# Patient Record
Sex: Female | Born: 1970 | Race: Black or African American | Hispanic: No | Marital: Married | Smoking: Current every day smoker
Health system: Southern US, Community
[De-identification: ages and names within clinical notes are randomized; demographics above are authoritative.]

## PROBLEM LIST (undated history)

## (undated) DIAGNOSIS — K121 Other forms of stomatitis: Secondary | ICD-10-CM

## (undated) DIAGNOSIS — K219 Gastro-esophageal reflux disease without esophagitis: Secondary | ICD-10-CM

## (undated) DIAGNOSIS — I10 Essential (primary) hypertension: Secondary | ICD-10-CM

## (undated) DIAGNOSIS — F32A Depression, unspecified: Secondary | ICD-10-CM

## (undated) DIAGNOSIS — U071 COVID-19: Secondary | ICD-10-CM

## (undated) DIAGNOSIS — E119 Type 2 diabetes mellitus without complications: Secondary | ICD-10-CM

## (undated) DIAGNOSIS — N390 Urinary tract infection, site not specified: Secondary | ICD-10-CM

## (undated) DIAGNOSIS — F329 Major depressive disorder, single episode, unspecified: Secondary | ICD-10-CM

## (undated) DIAGNOSIS — B019 Varicella without complication: Secondary | ICD-10-CM

## (undated) DIAGNOSIS — T7840XA Allergy, unspecified, initial encounter: Secondary | ICD-10-CM

## (undated) DIAGNOSIS — Z6372 Alcoholism and drug addiction in family: Secondary | ICD-10-CM

## (undated) DIAGNOSIS — F509 Eating disorder, unspecified: Secondary | ICD-10-CM

## (undated) HISTORY — DX: Alcoholism and drug addiction in family: Z63.72

## (undated) HISTORY — DX: Urinary tract infection, site not specified: N39.0

## (undated) HISTORY — DX: Type 2 diabetes mellitus without complications: E11.9

## (undated) HISTORY — DX: COVID-19: U07.1

## (undated) HISTORY — PX: UTERINE FIBROID SURGERY: SHX826

## (undated) HISTORY — DX: Allergy, unspecified, initial encounter: T78.40XA

## (undated) HISTORY — DX: Eating disorder, unspecified: F50.9

## (undated) HISTORY — DX: Varicella without complication: B01.9

## (undated) HISTORY — PX: HERNIA REPAIR: SHX51

## (undated) HISTORY — PX: TUBAL LIGATION: SHX77

## (undated) HISTORY — DX: Gastro-esophageal reflux disease without esophagitis: K21.9

## (undated) HISTORY — DX: Other forms of stomatitis: K12.1

## (undated) HISTORY — PX: TONSILLECTOMY: SUR1361

---

## 2010-10-30 DIAGNOSIS — D259 Leiomyoma of uterus, unspecified: Secondary | ICD-10-CM | POA: Insufficient documentation

## 2012-09-14 DIAGNOSIS — R61 Generalized hyperhidrosis: Secondary | ICD-10-CM | POA: Insufficient documentation

## 2013-10-25 DIAGNOSIS — F17210 Nicotine dependence, cigarettes, uncomplicated: Secondary | ICD-10-CM | POA: Insufficient documentation

## 2013-10-25 DIAGNOSIS — Z720 Tobacco use: Secondary | ICD-10-CM | POA: Insufficient documentation

## 2013-10-25 DIAGNOSIS — I10 Essential (primary) hypertension: Secondary | ICD-10-CM | POA: Insufficient documentation

## 2014-05-11 DIAGNOSIS — E6609 Other obesity due to excess calories: Secondary | ICD-10-CM | POA: Insufficient documentation

## 2014-05-11 DIAGNOSIS — Z6841 Body Mass Index (BMI) 40.0 and over, adult: Secondary | ICD-10-CM | POA: Insufficient documentation

## 2015-01-17 DIAGNOSIS — F1911 Other psychoactive substance abuse, in remission: Secondary | ICD-10-CM | POA: Insufficient documentation

## 2018-04-13 ENCOUNTER — Emergency Department: Payer: 59

## 2018-04-13 ENCOUNTER — Other Ambulatory Visit: Payer: Self-pay

## 2018-04-13 ENCOUNTER — Emergency Department
Admission: EM | Admit: 2018-04-13 | Discharge: 2018-04-13 | Disposition: A | Discharge status: Home / Self Care | Payer: 59 | Attending: Emergency Medicine | Admitting: Emergency Medicine

## 2018-04-13 DIAGNOSIS — I10 Essential (primary) hypertension: Secondary | ICD-10-CM | POA: Diagnosis not present

## 2018-04-13 DIAGNOSIS — R079 Chest pain, unspecified: Secondary | ICD-10-CM | POA: Diagnosis present

## 2018-04-13 DIAGNOSIS — F17200 Nicotine dependence, unspecified, uncomplicated: Secondary | ICD-10-CM | POA: Insufficient documentation

## 2018-04-13 HISTORY — DX: Major depressive disorder, single episode, unspecified: F32.9

## 2018-04-13 HISTORY — DX: Depression, unspecified: F32.A

## 2018-04-13 HISTORY — DX: Essential (primary) hypertension: I10

## 2018-04-13 LAB — BASIC METABOLIC PANEL
ANION GAP: 11 (ref 5–15)
BUN: 13 mg/dL (ref 6–20)
CALCIUM: 9.3 mg/dL (ref 8.9–10.3)
CHLORIDE: 102 mmol/L (ref 98–111)
CO2: 28 mmol/L (ref 22–32)
Creatinine, Ser: 0.83 mg/dL (ref 0.44–1.00)
GFR calc Af Amer: >60 mL/min (ref >60)
GFR calc non Af Amer: >60 mL/min (ref >60)
GLUCOSE: 127 mg/dL — AB (ref 70–99)
Potassium: 3.3 mmol/L — ABNORMAL LOW (ref 3.5–5.1)
Sodium: 141 mmol/L (ref 135–145)

## 2018-04-13 LAB — TROPONIN I
Troponin I: <0.03 ng/mL (ref <0.03)
Troponin I: <0.03 ng/mL (ref <0.03)

## 2018-04-13 LAB — CBC
HEMATOCRIT: 44 % (ref 36.0–46.0)
Hemoglobin: 14.1 g/dL (ref 12.0–15.0)
MCH: 27.4 pg (ref 26.0–34.0)
MCHC: 32 g/dL (ref 30.0–36.0)
MCV: 85.4 fL (ref 80.0–100.0)
NRBC: 0 % (ref 0.0–0.2)
PLATELETS: 256 10*3/uL (ref 150–400)
RBC: 5.15 MIL/uL — AB (ref 3.87–5.11)
RDW: 15.8 % — ABNORMAL HIGH (ref 11.5–15.5)
WBC: 13.5 10*3/uL — ABNORMAL HIGH (ref 4.0–10.5)

## 2018-04-13 NOTE — ED Notes (Signed)
Pt given urine cup for sample, unable to void at this time. Will continue to monitor

## 2018-04-13 NOTE — ED Triage Notes (Signed)
Central CP x 2 days. A&O, ambulatory. No distress noted. States she thinks it's gas. States pain is intermittent.

## 2018-04-13 NOTE — ED Provider Notes (Signed)
[facility] Emergency Department Provider Note  ____________________________________________  Time seen: Approximately 6:12 PM  I have reviewed the triage vital signs and the nursing notes.   HISTORY  Chief Complaint Chest Pain   HPI Judy Garcia is a 47 y.o. female with a history of smoking, hypertension and depression who presents for evaluation of chest pain.  Patient reports several daily episodes of left-sided chest pain that she describes as a dull pain that is mild to moderate, comes on both at rest or with exertion, lasts a minute and resolves on its own.  She feels the pain is from gas.  She denies any personal or family history of heart attacks, blood clots, recent travel immobilization, leg pain or swelling, hemoptysis, history of cancer, or exogenous hormones.  Patient denies any pain at this time.  No shortness of breath, dizziness, nausea or vomiting associated with these episodes.  Patient denies any drug use and reports that she has been clean for 27 years.  She is postmenopausal.  Past Medical History:  Diagnosis Date  . Depression   . Hypertension     There are no active problems to display for this patient.   Past Surgical History:  Procedure Laterality Date  . HERNIA REPAIR    . TUBAL LIGATION    . UTERINE FIBROID SURGERY      Prior to Admission medications   Not on File    Allergies Patient has no known allergies.  FH No history of coronary artery disease, PE, or DVT  Social History Social History   Tobacco Use  . Smoking status: Current Every Day Smoker  Substance Use Topics  . Alcohol use: Never    Frequency: Never  . Drug use: Not on file    Review of Systems  Constitutional: Negative for fever. Eyes: Negative for visual changes. ENT: Negative for sore throat. Neck: No neck pain  Cardiovascular: + chest pain. Respiratory: Negative for shortness of breath. Gastrointestinal: Negative for abdominal pain,  vomiting or diarrhea. Genitourinary: Negative for dysuria. Musculoskeletal: Negative for back pain. Skin: Negative for rash. Neurological: Negative for headaches, weakness or numbness. Psych: No SI or HI  ____________________________________________   PHYSICAL EXAM:  VITAL SIGNS: ED Triage Vitals  Enc Vitals Group     BP 04/13/18 1649 (!) 161/66     Pulse Rate 04/13/18 1649 84     Resp 04/13/18 1649 16     Temp 04/13/18 1649 98.1 F (36.7 C)     Temp src --      SpO2 04/13/18 1649 94 %     Weight 04/13/18 1650 225 lb (102.1 kg)     Height 04/13/18 1650 5\' 1"  (1.549 m)     Head Circumference --      Peak Flow --      Pain Score 04/13/18 1649 3     Pain Loc --      Pain Edu? --      Excl. in [place]? --     Constitutional: Alert and oriented. Well appearing and in no apparent distress. HEENT:      Head: Normocephalic and atraumatic.         Eyes: Conjunctivae are normal. Sclera is non-icteric.       Mouth/Throat: Mucous membranes are moist.       Neck: Supple with no signs of meningismus. Cardiovascular: Regular rate and rhythm. No murmurs, gallops, or rubs. 2+ symmetrical distal pulses are present in all extremities. No JVD.  Palpation  of the left chest wall reproduces the pain Respiratory: Normal respiratory effort. Lungs are clear to auscultation bilaterally. No wheezes, crackles, or rhonchi.  Gastrointestinal: Soft, non tender, and non distended with positive bowel sounds. No rebound or guarding. Musculoskeletal: Nontender with normal range of motion in all extremities. No edema, cyanosis, or erythema of extremities. Neurologic: Normal speech and language. Face is symmetric. Moving all extremities. No gross focal neurologic deficits are appreciated. Skin: Skin is warm, dry and intact. No rash noted. Psychiatric: Mood and affect are normal. Speech and behavior are normal.  ____________________________________________   LABS (all labs ordered are listed, but only abnormal  results are displayed)  Labs Reviewed  BASIC METABOLIC PANEL - Abnormal; Notable for the following components:      Result Value   Potassium 3.3 (*)    Glucose, Bld 127 (*)    All other components within normal limits  CBC - Abnormal; Notable for the following components:   WBC 13.5 (*)    RBC 5.15 (*)    RDW 15.8 (*)    All other components within normal limits  TROPONIN I  TROPONIN I   ____________________________________________  EKG  ED ECG REPORT I, Rudene Re, the attending physician, personally viewed and interpreted this ECG.  Normal sinus rhythm, rate of 86, LVH, normal intervals, normal axis, no ST elevations or depressions, T wave inversions in 3 and aVF.  No prior for comparison. ____________________________________________  RADIOLOGY  I have personally reviewed the images performed during this visit and I agree with the Radiologist's read.   Interpretation by Radiologist:  Dg Chest 2 View  Result Date: 04/13/2018 CLINICAL DATA:  Mid chest pain for 2 days EXAM: CHEST - 2 VIEW COMPARISON:  None. FINDINGS: No active infiltrate or effusion is seen. Mediastinal and hilar contours are unremarkable. The heart is within normal limits in size. No bony abnormality is noted IMPRESSION: No active cardiopulmonary disease. Electronically Signed   By: Ivar Drape M.D.   On: 04/13/2018 17:20     ____________________________________________   PROCEDURES  Procedure(s) performed: None Procedures Critical Care performed:  None ____________________________________________   INITIAL IMPRESSION / ASSESSMENT AND PLAN / ED COURSE  47 y.o. female with a history of smoking, hypertension and depression who presents for evaluation of several short lived episodes of L sided chest pain x 2 days with no associated symptoms. Pain reproducible on palpation of the chest wall, no rash. EKG showing TWI in inferior leads with no prior for comparison. First troponin negative. Will  monitor on telemetry and repeat troponin in 3 hours. CXR with no evidence of PNA or PTX. PERC negative for PE. No clinical suspicion for dissection no significant elevated blood pressure, short-lived episodes of pain with no current pain, neuro intact, normal mediastinum silhouette on chest x-ray and intact pulses in all 4 extremities.  Most likely musculoskeletal versus pleurisy versus gas pain.    _________________________ 8:39 PM on 04/13/2018 -----------------------------------------  2nd troponin is negative. Patient remains without CP. Will dc home with f/u with PCP. Discussed standard return precautions.    As part of my medical decision making, I reviewed the following data within the [place] notes reviewed and incorporated, Labs reviewed , EKG interpreted , Old chart reviewed, Radiograph reviewed , Notes from prior ED visits and [place] Controlled Substance Database    Pertinent labs & imaging results that were available during my care of the patient were reviewed by me and considered in my medical decision making (  see chart for details).    ____________________________________________   FINAL CLINICAL IMPRESSION(S) / ED DIAGNOSES  Final diagnoses:  Chest pain, unspecified type      NEW MEDICATIONS STARTED DURING THIS VISIT:  ED Discharge Orders    None       Note:  This document was prepared using Dragon voice recognition software and may include unintentional dictation errors.    Rudene Re, MD 04/13/18 2040

## 2018-04-13 NOTE — Discharge Instructions (Signed)

## 2018-07-28 ENCOUNTER — Encounter: Payer: Self-pay | Admitting: Family Medicine

## 2018-07-28 ENCOUNTER — Ambulatory Visit (INDEPENDENT_AMBULATORY_CARE_PROVIDER_SITE_OTHER): Payer: No Typology Code available for payment source | Admitting: Family Medicine

## 2018-07-28 VITALS — BP 112/80 | HR 86 | Temp 98.2°F | Resp 18 | Ht 60.0 in | Wt 229.6 lb

## 2018-07-28 DIAGNOSIS — L602 Onychogryphosis: Secondary | ICD-10-CM

## 2018-07-28 DIAGNOSIS — B351 Tinea unguium: Secondary | ICD-10-CM | POA: Insufficient documentation

## 2018-07-28 DIAGNOSIS — D170 Benign lipomatous neoplasm of skin and subcutaneous tissue of head, face and neck: Secondary | ICD-10-CM

## 2018-07-28 DIAGNOSIS — F1911 Other psychoactive substance abuse, in remission: Secondary | ICD-10-CM

## 2018-07-28 DIAGNOSIS — I10 Essential (primary) hypertension: Secondary | ICD-10-CM | POA: Diagnosis not present

## 2018-07-28 DIAGNOSIS — F17210 Nicotine dependence, cigarettes, uncomplicated: Secondary | ICD-10-CM

## 2018-07-28 DIAGNOSIS — F1011 Alcohol abuse, in remission: Secondary | ICD-10-CM

## 2018-07-28 DIAGNOSIS — F32A Depression, unspecified: Secondary | ICD-10-CM

## 2018-07-28 DIAGNOSIS — E559 Vitamin D deficiency, unspecified: Secondary | ICD-10-CM

## 2018-07-28 DIAGNOSIS — F17200 Nicotine dependence, unspecified, uncomplicated: Secondary | ICD-10-CM | POA: Insufficient documentation

## 2018-07-28 DIAGNOSIS — N951 Menopausal and female climacteric states: Secondary | ICD-10-CM

## 2018-07-28 DIAGNOSIS — F329 Major depressive disorder, single episode, unspecified: Secondary | ICD-10-CM

## 2018-07-28 MED ORDER — ALBUTEROL SULFATE 108 (90 BASE) MCG/ACT IN AEPB: 2.0000 | INHALATION_SPRAY | Freq: Four times a day (QID) | RESPIRATORY_TRACT | 2 refills | Status: DC | PRN

## 2018-07-28 NOTE — Progress Notes (Signed)
Subjective:    Patient ID: Judy Garcia, female    DOB: 03/02/1971, 48 y.o.   MRN: [id number]  HPI   Patient presents to clinic to establish with primary care.  Patient has a history of essential hypertension, depression, constipation.  Blood pressure is currently controlled with triamterene hydrochlorothiazide, feels well on this medication.  Has been taking Effexor for at least 4 to 5 years, feels mood is stable on this medication.  Denies any SI or HI.  Patient reports a past history of alcohol and drug abuse.  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  Patient is a cigarette smoker.  Smokes 1 pack/day.  Patient also complains of episodes of hot flashes and sweats throughout the day and throughout the night, she is menopausal.  Has not had a menstrual cycle in almost 2 years.  Patient also complains of thickened toenails and possibly toenail fungus.  She would like a referral to dermatology to have evaluation for possible removal of lump on her forehead.  Past Medical History:  Diagnosis Date  . Alcoholism and drug addiction in family   . Allergy   . Chicken pox   . Depression   . Eating disorder   . GERD (gastroesophageal reflux disease)   . Hypertension   . Ulcer (traumatic) of oral mucosa   . UTI (urinary tract infection)    Social History   Tobacco Use  . Smoking status: Current Every Day Smoker  . Smokeless tobacco: Never Used  Substance Use Topics  . Alcohol use: Never    Frequency: Never   Past Surgical History:  Procedure Laterality Date  . HERNIA REPAIR    . TONSILLECTOMY    . TUBAL LIGATION    . UTERINE FIBROID SURGERY     Family History  Problem Relation Age of Onset  . Diabetes Mother   . Cancer Maternal Grandmother    Review of Systems  Constitutional: Negative for chills, fatigue and fever. +hot flashes.  HENT: Negative for congestion, ear pain, sinus pain and sore throat.   Eyes: Negative.   Respiratory: Negative for cough,  shortness of breath and wheezing.   Cardiovascular: Negative for chest pain, palpitations and leg swelling.  Gastrointestinal: Negative for abdominal pain, diarrhea, nausea and vomiting.  Genitourinary: Negative for dysuria, frequency and urgency.  Musculoskeletal: Negative for arthralgias and myalgias.  Skin: +lump on face, back of head. +thick toenails, ?fungus.  Neurological: Negative for syncope, light-headedness and headaches.  Psychiatric/Behavioral: The patient is not nervous/anxious.       Objective:   Physical Exam Vitals signs and nursing note reviewed.  Constitutional:      General: She is not in acute distress.    Appearance: She is well-developed.  HENT:     Head: Normocephalic and atraumatic.      Comments: Soft lump on right forehead, approximately size of golf ball.  Does appear consistent with a lipoma.  Also has a harder lump on right posterior head, about size of a marble; suspect this could be a cyst.    Right Ear: Tympanic membrane, ear canal and external ear normal.     Left Ear: Tympanic membrane, ear canal and external ear normal.     Nose: Nose normal.     Mouth/Throat:     Mouth: Mucous membranes are moist.  Eyes:     General: No scleral icterus.       Right eye: No discharge.        Left eye:  No discharge.     Extraocular Movements: Extraocular movements intact.     Conjunctiva/sclera: Conjunctivae normal.     Pupils: Pupils are equal, round, and reactive to light.  Neck:     Musculoskeletal: Normal range of motion and neck supple.     Trachea: No tracheal deviation.  Cardiovascular:     Rate and Rhythm: Normal rate and regular rhythm.     Heart sounds: Normal heart sounds. No murmur. No friction rub. No gallop.   Pulmonary:     Effort: Pulmonary effort is normal. No respiratory distress.     Breath sounds: Normal breath sounds. No wheezing, rhonchi or rales.  Abdominal:     General: Bowel sounds are normal.     Palpations: Abdomen is soft.      Tenderness: There is no abdominal tenderness. There is no guarding.  Musculoskeletal: Normal range of motion.        General: No deformity.  Feet:     Comments: Toenails do appear thicker than normal, however I am unable to tell if they are fungal due to patient having purple nail polish on toenails Lymphadenopathy:     Cervical: No cervical adenopathy.  Skin:    General: Skin is warm and dry.     Capillary Refill: Capillary refill takes less than 2 seconds.     Coloration: Skin is not pale.     Findings: No erythema.  Neurological:     General: No focal deficit present.     Mental Status: She is alert and oriented to person, place, and time.     Cranial Nerves: No cranial nerve deficit.  Psychiatric:        Behavior: Behavior normal.        Thought Content: Thought content normal.    Vitals:   07/28/18 0919  BP: 112/80  Pulse: 86  Resp: 18  Temp: 98.2 F (36.8 C)  SpO2: 92%       Assessment & Plan:    Essential hypertension-patient's blood pressure is well controlled on current medication.  She will continue triamterene hydrochlorothiazide  Cigarette smoker-greater than 3 minutes spent discussing smoking cessation.  Discussed different options including nicotine patches, nicotine gum/lozenges or Chantix.  Patient is unsure which path she would like to take at this time but would like to think about it.  Advised patient of the risks of continued smoking including vascular issues, cancers, breathing problems and death.  Patient verbalizes understanding of all these risks.  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  Suspect the small cyst on back of head could be a lipoma or a cyst.  We will do referral to dermatology for further evaluation.  Thickened nails- toenails do appear thicker, I am unable to tell if they appear fungal.  Referral to podiatry placed.  Depressive disorder-patient's mood is stable on Effexor.  She will continue this dose.  Menopausal  symptoms- suspect patient's hot flashes are related to menopausal issues.  Advised patient that the Effexor she takes for her depression can also help with menopausal symptoms.  Patient is not a candidate for hormone replacement therapy due to her smoking.  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  She will get lab work in clinic today.  Patient will follow-up here in approximately 4 weeks for complete physical exam

## 2018-07-29 LAB — CBC
HEMATOCRIT: 43.6 % (ref 35.0–45.0)
Hemoglobin: 14.4 g/dL (ref 11.7–15.5)
MCH: 27.7 pg (ref 27.0–33.0)
MCHC: 33 g/dL (ref 32.0–36.0)
MCV: 83.8 fL (ref 80.0–100.0)
MPV: 12.3 fL (ref 7.5–12.5)
Platelets: 170 10*3/uL (ref 140–400)
RBC: 5.2 10*6/uL — ABNORMAL HIGH (ref 3.80–5.10)
RDW: 13.5 % (ref 11.0–15.0)
WBC: 14.7 10*3/uL — ABNORMAL HIGH (ref 3.8–10.8)

## 2018-07-29 LAB — HEMOGLOBIN A1C
HEMOGLOBIN A1C: 5.6 %{Hb} (ref <5.7)
MEAN PLASMA GLUCOSE: 114 (calc)
eAG (mmol/L): 6.3 (calc)

## 2018-07-29 LAB — THYROID PANEL WITH TSH

## 2018-07-30 MED ORDER — VITAMIN D (ERGOCALCIFEROL) 1.25 MG (50000 UNIT) PO CAPS: 50000.0000 [IU] | ORAL_CAPSULE | ORAL | 0 refills | Status: DC

## 2018-07-30 NOTE — Addendum Note (Signed)
Addended by: Philis Nettle on: 07/30/2018 04:45 PM   Modules accepted: Orders

## 2018-08-03 LAB — COMPREHENSIVE METABOLIC PANEL

## 2018-08-03 LAB — LIPID PANEL

## 2018-08-03 LAB — VITAMIN D 25 HYDROXY (VIT D DEFICIENCY, FRACTURES): VIT D 25 HYDROXY: 8 ng/mL — AB (ref 30–100)

## 2018-08-03 LAB — B12 AND FOLATE PANEL

## 2018-08-04 ENCOUNTER — Telehealth: Payer: Self-pay | Admitting: Family Medicine

## 2018-08-04 ENCOUNTER — Encounter: Payer: Self-pay | Admitting: Family Medicine

## 2018-08-04 NOTE — Telephone Encounter (Signed)
Looks like lab work was done at the hospital and his glucose was elevated at 127 and potassium was  3.3.   Please advise if anything needed to be address to the patient regarding results. Thanks

## 2018-08-04 NOTE — Telephone Encounter (Signed)
Lab results given and documented in result note 

## 2018-08-04 NOTE — Telephone Encounter (Signed)
Patient calling for results. NT unavailable.    Copied from [place] [phone]. Topic: Quick Communication - Lab Results (Clinic Use ONLY) >> Jul 30, 2018  4:48 PM Neta Ehlers, [place] wrote: Called patient to inform them of 07/29/2018 lab results. When patient returns call, triage nurse may disclose results.

## 2018-08-26 ENCOUNTER — Encounter: Payer: No Typology Code available for payment source | Admitting: Family Medicine

## 2018-09-07 ENCOUNTER — Telehealth: Payer: Self-pay | Admitting: Lab

## 2018-09-07 ENCOUNTER — Telehealth: Payer: Self-pay | Admitting: Family Medicine

## 2018-09-07 DIAGNOSIS — I10 Essential (primary) hypertension: Secondary | ICD-10-CM

## 2018-09-07 MED ORDER — TRIAMTERENE-HCTZ 37.5-25 MG PO CAPS: 2.0000 | ORAL_CAPSULE | Freq: Every day | ORAL | 0 refills | Status: DC

## 2018-09-07 NOTE — Telephone Encounter (Signed)
Patient left voicemail message requesting a new prescription of    triamterene-hydrochlorothiazide (DYAZIDE) 37.5-25 MG capsule to be sent to the pharmacy. Patient states that the pharmacy has been able to fill the current prescription because the current prescription states to take one capsule by mouth daily and the pt states she has been taking 2 capsules by mouth daily for a couple of years. Patient can be contacted at [phone]

## 2018-09-07 NOTE — Addendum Note (Signed)
Addended by: Philis Nettle on: 09/07/2018 01:57 PM   Modules accepted: Orders

## 2018-09-07 NOTE — Telephone Encounter (Signed)
Refill sent in for three month supply  Please schedule her for a physical some time in May 2020 or June 2020 so we can repeat lab work and get CPE done  She missed her physical appt that was scheduled on 08/26/2018

## 2018-09-07 NOTE — Telephone Encounter (Signed)
Pt called Judy Garcia Patient left voicemail message requesting a new prescription of    triamterene-hydrochlorothiazide (DYAZIDE) 37.5-25 MG capsule to be sent to the pharmacy. Patient states that the pharmacy has been able to fill the current prescription because the current prescription states to take one capsule by mouth daily and the pt states she has been taking 2 capsules by mouth daily for a couple of years. Patient can be contacted at [phone]   I can see the Rx, but it looks like there was no start date in the Pt chart next to the medication.

## 2018-09-07 NOTE — Telephone Encounter (Signed)
Perfect, thanks  LG

## 2018-09-07 NOTE — Telephone Encounter (Signed)
Called Pt and told her Rx was sent to the pharmacy, also scheduled her a Physical for 10/07/2018 @10 :00am

## 2018-09-13 ENCOUNTER — Telehealth: Payer: Self-pay

## 2018-09-13 NOTE — Telephone Encounter (Signed)
Called Pt No answer left a VM for Pt to call office back

## 2018-09-13 NOTE — Telephone Encounter (Signed)
There are multiple different colors, shapes of N95 masks. The N95 you get depends on the size you need. There is not something more protective than the N95 that I know of. If she has concerns with the mask she was given, I would suggest she speak with her manager and employee health to re-sized/make sure she is wearing right size

## 2018-09-13 NOTE — Telephone Encounter (Signed)
Pt called [employer]: Patient called to ask the nurse if she should be wearing a more protective  mask besides the N95 since she has bronchitis.  Patient stated that her employer issued the N95 to her but the nurses got a different mask.  Please advise and call patient back to let her know.  CB# [phone]

## 2018-09-13 NOTE — Telephone Encounter (Signed)
Copied from [place] [id number]. Topic: General - Other >> Sep 10, 2018 10:41 AM Keene Breath wrote: Reason for CRM: Patient called to ask the nurse if she should be wearing a more protective  mask besides the N95 since she has bronchitis.  Patient stated that her employer issued the N95 to her but the nurses got a different mask.  Please advise and call patient back to let her know.  CB# [phone]

## 2018-09-13 NOTE — Telephone Encounter (Signed)
Called Pt and spoke to her about the N95 mask. The Pt stated that the RN's and Dr,s walk around with a different kind of mask,  and with her Bronchitis should she have the same mask as the RN's &Dr's are wearing, and not the N95. Pt stated they will not give her a different mask without a doctors note. I told her that the N95 she has is the best protection  and if she have any concerns or feels that its not properly fitting to talk to her manager or employee health

## 2018-09-13 NOTE — Telephone Encounter (Signed)
I am not sure what type of mask she is referring to. As far as I know, the N95 is the best type of mask; next best is the surgical mask.

## 2018-10-01 ENCOUNTER — Other Ambulatory Visit: Payer: Self-pay

## 2018-10-01 ENCOUNTER — Ambulatory Visit (INDEPENDENT_AMBULATORY_CARE_PROVIDER_SITE_OTHER): Payer: No Typology Code available for payment source | Admitting: Family Medicine

## 2018-10-01 ENCOUNTER — Telehealth: Payer: Self-pay

## 2018-10-01 DIAGNOSIS — F329 Major depressive disorder, single episode, unspecified: Secondary | ICD-10-CM | POA: Diagnosis not present

## 2018-10-01 DIAGNOSIS — D170 Benign lipomatous neoplasm of skin and subcutaneous tissue of head, face and neck: Secondary | ICD-10-CM

## 2018-10-01 DIAGNOSIS — F32A Depression, unspecified: Secondary | ICD-10-CM

## 2018-10-01 DIAGNOSIS — Z716 Tobacco abuse counseling: Secondary | ICD-10-CM | POA: Diagnosis not present

## 2018-10-01 DIAGNOSIS — D171 Benign lipomatous neoplasm of skin and subcutaneous tissue of trunk: Secondary | ICD-10-CM | POA: Diagnosis not present

## 2018-10-01 MED ORDER — VARENICLINE TARTRATE 0.5 MG X 11 & 1 MG X 42 PO MISC: ORAL | 0 refills | Status: DC

## 2018-10-01 MED ORDER — VARENICLINE TARTRATE 1 MG PO TABS: 1.0000 mg | ORAL_TABLET | Freq: Two times a day (BID) | ORAL | 2 refills | Status: DC

## 2018-10-01 NOTE — Telephone Encounter (Signed)
Copied from [place] [id number]. Topic: Referral - Request for Referral >> Oct 01, 2018 10:32 AM Sheran Luz wrote: Has patient seen PCP for this complaint? No Referral for which specialty: Dermatology  Preferred provider/office: Unspecified- Judy Garcia is preferred  Reason for referral: Lipoma

## 2018-10-01 NOTE — Telephone Encounter (Signed)
Pt agreed to 3:40pm Doxy visit today

## 2018-10-01 NOTE — Progress Notes (Signed)
Patient ID: Judy Garcia, female   DOB: 09-18-1970, 48 y.o.   MRN: [id number]  Virtual Visit via video Note  This visit type was conducted due to national recommendations for restrictions regarding the COVID-19 pandemic (e.g. social distancing).  This format is felt to be most appropriate for this patient at this time.  All issues noted in this document were discussed and addressed.  No physical exam was performed (except for noted visual exam findings with Video Visits).   I connected with [name] on 28/76/81 at  3:40 PM EDT by a video enabled telemedicine application and verified that I am speaking with the correct person using two identifiers. Location patient: home Location provider: [facility] Persons participating in the virtual visit: patient, provider  I discussed the limitations, risks, security and privacy concerns of performing an evaluation and management service by video and the availability of in person appointments. I also discussed with the patient that there may be a patient responsible charge related to this service. The patient expressed understanding and agreed to proceed.  HPI:  Patient and I connected via video to discuss lipoma on her head and back and also discussed smoking cessation.  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  Seems sometimes a lipoma is putting some pressure on facial nerve causing weakness in that side of face at times, weakness in the size of his been having off and on for over 6 months.  States some days it does not bother her much other days she will notice it more.  We had put a referral for dermatology for patient back in in February, but patient did not go to appointment.  Patient also has a lipoma on left mid back that is painful especially when she leans back on it.  Patient is unable to show me the lipoma on her back at this time, but states she thinks it has gotten  bigger due to it being more tender when leaning back.   Has decided she wants to finally quit smoking. Feels she is ready to do this. Has smoked between 1/2 PPD to 1 PPD for over 20 years.  Her mood is stable on Effexor denies any SI or HI.  Denies shortness of breath or wheezing.  Denies fever or chills.  Denies loss of visual field.  Is able to smile and raise eyebrows.  Is able to chew food without issues.  Denies sharp pain in head.  Denies one-sided extremity weakness.  Denies GI or GU issues.  ROS: See pertinent positives and negatives per HPI.  Past Medical History:  Diagnosis Date  . Alcoholism and drug addiction in family   . Allergy   . Chicken pox   . Depression   . Eating disorder   . GERD (gastroesophageal reflux disease)   . Hypertension   . Ulcer (traumatic) of oral mucosa   . UTI (urinary tract infection)     Past Surgical History:  Procedure Laterality Date  . HERNIA REPAIR    . TONSILLECTOMY    . TUBAL LIGATION    . UTERINE FIBROID SURGERY      Family History  Problem Relation Age of Onset  . Diabetes Mother   . Cancer Maternal Grandmother    Social History   Tobacco Use  . Smoking status: Current Every Day Smoker  . Smokeless tobacco: Never Used  Substance Use Topics  . Alcohol  use: Never    Frequency: Never    Current Outpatient Medications:  .  Albuterol Sulfate (PROAIR RESPICLICK) 854 (90 Base) MCG/ACT AEPB, Inhale 2 puffs into the lungs 4 (four) times daily as needed., Disp: 1 each, Rfl: 2 .  Ascorbic Acid (VITAMIN C) 100 MG tablet, Take 100 mg by mouth as needed., Disp: , Rfl:  .  docusate (COLACE) 50 MG/5ML liquid, Take 50 mg by mouth as needed for mild constipation., Disp: , Rfl:  .  Multiple Vitamin (MULTIVITAMIN) tablet, Take 1 tablet by mouth daily., Disp: , Rfl:  .  triamterene-hydrochlorothiazide (DYAZIDE) 37.5-25 MG capsule, Take 2 each (2 capsules total) by mouth daily., Disp: 180 capsule, Rfl: 0 .  venlafaxine XR (EFFEXOR-XR) 75 MG  24 hr capsule, Take 75 mg by mouth daily with breakfast., Disp: , Rfl:  .  Vitamin D, Ergocalciferol, (DRISDOL) 1.25 MG (50000 UT) CAPS capsule, Take 1 capsule (50,000 Units total) by mouth every 7 (seven) days., Disp: 12 capsule, Rfl: 0 .  varenicline (CHANTIX CONTINUING MONTH PAK) 1 MG tablet, Take 1 tablet (1 mg total) by mouth 2 (two) times daily., Disp: 60 tablet, Rfl: 2 .  varenicline (CHANTIX STARTING MONTH PAK) 0.5 MG X 11 & 1 MG X 42 tablet, Take one 0.5 mg tablet by mouth 1x daily for 3 days, then increase to one 0.5 mg tablet 2x daily for 4 days, then increase to 1mg  2x daily., Disp: 53 tablet, Rfl: 0  EXAM:  GENERAL: alert, oriented, appears well and in no acute distress  HEENT: atraumatic, conjunttiva clear, no obvious abnormalities on inspection of external nose and ears  NECK: normal movements of the head and neck  LUNGS: on inspection no signs of respiratory distress, breathing rate appears normal, no obvious gross SOB, gasping or wheezing  CV: no obvious cyanosis  SKIN: Lipoma seen on right upper forehead alongside hairline.  Appears about the size of a large gumball.  MS: moves all visible extremities without noticeable abnormality  PSYCH/NEURO: pleasant and cooperative, no obvious depression or anxiety, speech and thought processing grossly intact.  Able to raise eyebrows puff out cheeks, smile, great teeth without issue.  ASSESSMENT AND PLAN:  Discussed the following assessment and plan:  Encounter for smoking cessation counseling - Plan: varenicline (CHANTIX STARTING MONTH PAK) 0.5 MG X 11 & 1 MG X 42 tablet, varenicline (CHANTIX CONTINUING MONTH PAK) 1 MG tablet  Lipoma of face - Plan: Ambulatory referral to Dermatology  Lipoma of back - Plan: Ambulatory referral to Dermatology  Depressive disorder  Patient will begin Chantix to help assist with smoking cessation.  She will begin with starting month pack and then go onto the continuing month pack.  Advised  patient to also get Nicorette or nicotine gum in addition to the Chantix.  The idea behind Chantix is to slowly wean self off of smoking and eventually you will not have urge to smoke at all.  Nicotine gum will also help curb cigarette cravings because when you have a craving for a cigarette you can use nicotine gum to help combat this.  It was discovered that patient did not go to dermatology appointment that was referred for her and set up back in February 2020.  We will refer back to dermatology for evaluation of a lipoma on her face and on her back.   I discussed the assessment and treatment plan with the patient. The patient was provided an opportunity to ask questions and all were answered. The patient agreed  with the plan and demonstrated an understanding of the instructions.   The patient was advised to call back or seek an in-person evaluation if the symptoms worsen or if the condition fails to improve as anticipated.  Patient will keep appointment as planned on Oct 07, 2018 for Pap smear.  She is aware she can call office at any time if issues arise.  Jodelle Green, FNP

## 2018-10-01 NOTE — Telephone Encounter (Signed)
Please have patient set up virtual visit -- not sure where lipoma is and would need to assess at least over virtual visit before I can refer  She missed her last appt here.

## 2018-10-04 ENCOUNTER — Telehealth: Payer: Self-pay | Admitting: Family Medicine

## 2018-10-04 NOTE — Telephone Encounter (Signed)
Judy Garcia,  This patient had a referral for lipoma on face back in Feb 2020, looks like it is closed but not sure if it was because the referral is incorrect?  She also has a lipoma on her back she would like removed.  If I need to put in a different referral I will!  Thanks!  LG

## 2018-10-04 NOTE — Telephone Encounter (Signed)
Ok thanks for looking into this!   If I re-referred, would they still see her?

## 2018-10-04 NOTE — Telephone Encounter (Signed)
yes

## 2018-10-04 NOTE — Telephone Encounter (Signed)
Perfect, I put in new referral  Thanks!

## 2018-10-04 NOTE — Telephone Encounter (Signed)
She was scheduled on 3/24 to be seen at [facility]. She no-showed the appointment. Melissa

## 2018-10-07 ENCOUNTER — Other Ambulatory Visit (HOSPITAL_COMMUNITY)
Admission: RE | Admit: 2018-10-07 | Discharge: 2018-10-07 | Disposition: A | Discharge status: Home / Self Care | Payer: No Typology Code available for payment source | Source: Ambulatory Visit | Attending: Family Medicine | Admitting: Family Medicine

## 2018-10-07 ENCOUNTER — Ambulatory Visit (INDEPENDENT_AMBULATORY_CARE_PROVIDER_SITE_OTHER): Payer: No Typology Code available for payment source | Admitting: Family Medicine

## 2018-10-07 ENCOUNTER — Other Ambulatory Visit: Payer: Self-pay

## 2018-10-07 ENCOUNTER — Encounter: Payer: Self-pay | Admitting: Family Medicine

## 2018-10-07 ENCOUNTER — Other Ambulatory Visit: Payer: Self-pay | Admitting: Family Medicine

## 2018-10-07 VITALS — BP 136/82 | HR 92 | Temp 98.7°F | Resp 18 | Ht 61.0 in | Wt 231.0 lb

## 2018-10-07 DIAGNOSIS — D171 Benign lipomatous neoplasm of skin and subcutaneous tissue of trunk: Secondary | ICD-10-CM

## 2018-10-07 DIAGNOSIS — F17210 Nicotine dependence, cigarettes, uncomplicated: Secondary | ICD-10-CM | POA: Diagnosis not present

## 2018-10-07 DIAGNOSIS — E559 Vitamin D deficiency, unspecified: Secondary | ICD-10-CM

## 2018-10-07 DIAGNOSIS — Z124 Encounter for screening for malignant neoplasm of cervix: Secondary | ICD-10-CM | POA: Insufficient documentation

## 2018-10-07 DIAGNOSIS — Z1231 Encounter for screening mammogram for malignant neoplasm of breast: Secondary | ICD-10-CM | POA: Diagnosis not present

## 2018-10-07 DIAGNOSIS — Z1211 Encounter for screening for malignant neoplasm of colon: Secondary | ICD-10-CM

## 2018-10-07 DIAGNOSIS — D170 Benign lipomatous neoplasm of skin and subcutaneous tissue of head, face and neck: Secondary | ICD-10-CM

## 2018-10-07 DIAGNOSIS — Z0000 Encounter for general adult medical examination without abnormal findings: Secondary | ICD-10-CM

## 2018-10-07 DIAGNOSIS — I10 Essential (primary) hypertension: Secondary | ICD-10-CM | POA: Diagnosis not present

## 2018-10-07 DIAGNOSIS — K5900 Constipation, unspecified: Secondary | ICD-10-CM

## 2018-10-07 DIAGNOSIS — F329 Major depressive disorder, single episode, unspecified: Secondary | ICD-10-CM

## 2018-10-07 DIAGNOSIS — E876 Hypokalemia: Secondary | ICD-10-CM

## 2018-10-07 DIAGNOSIS — F32A Depression, unspecified: Secondary | ICD-10-CM

## 2018-10-07 LAB — COMPREHENSIVE METABOLIC PANEL
ALT: 23 U/L (ref 0–35)
AST: 21 U/L (ref 0–37)
Albumin: 4.3 g/dL (ref 3.5–5.2)
Alkaline Phosphatase: 94 U/L (ref 39–117)
BUN: 18 mg/dL (ref 6–23)
CO2: 31 mEq/L (ref 19–32)
Calcium: 9.5 mg/dL (ref 8.4–10.5)
Chloride: 97 mEq/L (ref 96–112)
Creatinine, Ser: 0.68 mg/dL (ref 0.40–1.20)
GFR: 110.68 mL/min (ref >60.00)
Glucose, Bld: 110 mg/dL — ABNORMAL HIGH (ref 70–99)
Potassium: 3 mEq/L — ABNORMAL LOW (ref 3.5–5.1)
Sodium: 139 mEq/L (ref 135–145)
Total Bilirubin: 0.2 mg/dL (ref 0.2–1.2)
Total Protein: 7.3 g/dL (ref 6.0–8.3)

## 2018-10-07 LAB — LIPID PANEL
Cholesterol: 213 mg/dL — ABNORMAL HIGH (ref 0–200)
HDL: 59.4 mg/dL (ref >39.00)
LDL Cholesterol: 127 mg/dL — ABNORMAL HIGH (ref 0–99)
NonHDL: 153.6
Total CHOL/HDL Ratio: 4
Triglycerides: 135 mg/dL (ref 0.0–149.0)
VLDL: 27 mg/dL (ref 0.0–40.0)

## 2018-10-07 LAB — VITAMIN D 25 HYDROXY (VIT D DEFICIENCY, FRACTURES): VITD: 34.34 ng/mL (ref 30.00–100.00)

## 2018-10-07 LAB — TSH: TSH: 3.23 u[IU]/mL (ref 0.35–4.50)

## 2018-10-07 MED ORDER — DOCUSATE SODIUM 100 MG PO CAPS: 100.0000 mg | ORAL_CAPSULE | Freq: Every day | ORAL | 1 refills | Status: AC

## 2018-10-07 NOTE — Progress Notes (Signed)
Colace rx

## 2018-10-07 NOTE — Progress Notes (Signed)
Subjective:    Patient ID: Judy Garcia, female    DOB: 01-15-1971, 48 y.o.   MRN: [id number]  HPI   Patient presents to clinic for complete physical exam and Pap smear.  Overall she has been feeling well.  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  She has been called off some nights due to them not having a full sentences.  She is due for her mammogram we will get this ordered.  She has never had a colonoscopy, she is 49 years old so we will get a colonoscopy referral placed.  Patient has had abnormal Pap smear previously that required some freezing of cells of the cervix, but has not had anything abnormal since.  No issues with pelvic pain, abnormal bleeding or abnormal vaginal discharge.  Blood pressures have been stable on current medications.  Patient is a cigarette smoker, she is planning to start the Chantix.  Prescriptions were sent at last visit she has not yet picked them up.  Lipoma of her face continues to bother her, but is not painful.  Lipoma of back is painful especially when leaning up against a chair.  Mood is stable on Effexor.  Does not feel overly anxious or overly down.  Feels she overall is handling the additional stress of the coronavirus pandemic as best as possible.  Patient does usually see a dentist regularly, most recent appointment had to be pushed out due to COVID-19.  Has not seen an eye doctor recently, plans to make an eye doctor appointment once pandemic restrictions are lessened.  Patient Active Problem List   Diagnosis Date Noted  . Cigarette smoker 07/28/2018  . Lipoma of face 07/28/2018  . Thickened nails 07/28/2018  . Essential hypertension 07/28/2018  . History of alcohol abuse 07/28/2018  . Personal history of drug abuse ([place]) 07/28/2018  . Menopausal symptoms 07/28/2018  . Depressive disorder 07/28/2018   Social History   Tobacco Use  . Smoking status: Current Every Day Smoker  . Smokeless tobacco: Never Used   Substance Use Topics  . Alcohol use: Never    Frequency: Never   Past Surgical History:  Procedure Laterality Date  . HERNIA REPAIR    . TONSILLECTOMY    . TUBAL LIGATION    . UTERINE FIBROID SURGERY     Family History  Problem Relation Age of Onset  . Diabetes Mother   . Cancer Maternal Grandmother     Review of Systems  Constitutional: Negative for chills, fatigue and fever.  HENT: Negative for congestion, ear pain, sinus pain and sore throat.   Eyes: Negative.   Respiratory: Negative for cough, shortness of breath and wheezing.   Cardiovascular: Negative for chest pain, palpitations and leg swelling.  Gastrointestinal: Negative for abdominal pain, diarrhea, nausea and vomiting.  Genitourinary: Negative for dysuria, frequency and urgency.  Musculoskeletal: Negative for arthralgias and myalgias.  Skin: Lipoma of face and back Neurological: Negative for syncope, light-headedness and headaches.  Psychiatric/Behavioral: The patient is not nervous/anxious. No SI or HI.     Objective:   Physical Exam Vitals signs and nursing note reviewed.  Constitutional:      Appearance: She is well-developed. She is obese.  HENT:     Head: Normocephalic and atraumatic.     Right Ear: External ear normal.     Left Ear: External ear normal.     Nose: Nose normal.  Eyes:     General: No scleral icterus.  Right eye: No discharge.        Left eye: No discharge.     Conjunctiva/sclera: Conjunctivae normal.     Pupils: Pupils are equal, round, and reactive to light.  Neck:     Musculoskeletal: Normal range of motion and neck supple.     Trachea: No tracheal deviation.  Cardiovascular:     Rate and Rhythm: Normal rate and regular rhythm.     Heart sounds: Normal heart sounds. No murmur. No friction rub. No gallop.   Pulmonary:     Effort: Pulmonary effort is normal. No respiratory distress.     Breath sounds: Normal breath sounds. No wheezing, rhonchi or rales.  Abdominal:      General: Bowel sounds are normal.     Palpations: Abdomen is soft.     Tenderness: There is no abdominal tenderness. There is no guarding.     Hernia: There is no hernia in the right inguinal area or left inguinal area.  Genitourinary:    Pubic Area: No rash.      Labia:        Right: No rash, tenderness, lesion or injury.        Left: No rash, tenderness, lesion or injury.      Vagina: Normal.     Cervix: Normal.     Uterus: Not tender.      Adnexa:        Right: No tenderness.         Left: No tenderness.       Comments: Pap collected and sent to lab Musculoskeletal: Normal range of motion.        General: No deformity.  Lymphadenopathy:     Cervical: No cervical adenopathy.     Lower Body: No right inguinal adenopathy. No left inguinal adenopathy.  Skin:    General: Skin is warm and dry.     Capillary Refill: Capillary refill takes less than 2 seconds.     Coloration: Skin is not pale.     Findings: No erythema.          Comments: Blue marks on diagram represent lipoma locations  Neurological:     Mental Status: She is alert and oriented to person, place, and time.     Cranial Nerves: No cranial nerve deficit.  Psychiatric:        Behavior: Behavior normal.        Thought Content: Thought content normal.     Vitals:   10/07/18 1009  BP: 136/82  Pulse: 92  Resp: 18  Temp: 98.7 F (37.1 C)  SpO2: 95%       Assessment & Plan:    Routine medical exam-Pap smear collected and sent to lab.  Colon cancer screening referral placed to GI.  Mammogram ordered.  Discussed healthy diet and regular exercise, recommended diet high in lean proteins lots of vegetables, low sugars and lower carbs.  Discussed regular physical activity of at minimum walking for 30 minutes 3 to 5 days a week and light weight training.  Discussed safe sun practices including wearing SPF at least 30 when outdoors and wearing a hat to protect skin of face.  Encourage patient to make dentist and eye  doctor appointments with COVID-19 pandemic restrictions lessened.  Vitamin D deficiency-we will recheck vitamin D level today to see if it has improved from previous lab.  Hypertension-BP is stable on current medications.  Cigarette smoker-patient encouraged to start Chantix as discussed at previous visit.  Greater than  3 minutes spent discussing positive factors associated with smoking last and complete smoking cessation.  Patient states she will start this medication and does feel motivated to quit.  Depressive disorder-mood stable on current dose of Effexor.  Will continue.  Lipoma of face and back- dermatology referral has been placed at previous visit.  Patient will be contacted about these referrals.  Patient advised to let us know if she does not hear anything about the referral in the next 1 to 2 weeks.  We will have patient follow-up here in approximately 4 weeks for recheck on smoking cessation progress.  Aware she can call office at any time if issues arise.

## 2018-10-08 LAB — CYTOLOGY - PAP
Adequacy: ABSENT
Diagnosis: NEGATIVE

## 2018-10-08 MED ORDER — POTASSIUM CHLORIDE 20 MEQ PO PACK: 20.0000 meq | PACK | Freq: Every day | ORAL | 0 refills | Status: DC

## 2018-10-08 NOTE — Addendum Note (Signed)
Addended by: Philis Nettle on: 10/08/2018 01:53 PM   Modules accepted: Orders

## 2018-10-19 ENCOUNTER — Encounter: Payer: Self-pay | Admitting: Family Medicine

## 2018-11-01 ENCOUNTER — Encounter: Payer: Self-pay | Admitting: *Deleted

## 2018-12-01 ENCOUNTER — Telehealth: Payer: Self-pay | Admitting: Family Medicine

## 2018-12-01 ENCOUNTER — Encounter: Payer: Self-pay | Admitting: Family Medicine

## 2018-12-01 ENCOUNTER — Telehealth: Payer: Self-pay | Admitting: Lab

## 2018-12-01 NOTE — Telephone Encounter (Signed)
Please schedule doxy appt with patient to discuss her symptoms/situation  Thanks  LG

## 2018-12-01 NOTE — Telephone Encounter (Signed)
Called Pt cell No answer left a VM. Called Pt home number and Pt husband stated she was not there and he wanted to know if NP Philis Nettle could find out his Covid-19 results, because he did the Covid-19 test and does not have a provider. But he did say he will let his wife to call the office to set up an appt when she comes home.

## 2018-12-01 NOTE — Telephone Encounter (Signed)
Patient states she got tested for COVID testing, at [facility]. Her results are not back but patient is concerned about symptoms she is having. Patient would like pcp or nurse to call for further advice  Call back # [phone]

## 2018-12-01 NOTE — Telephone Encounter (Signed)
Called and spoke to patient.  Patient said that she got sick last week and was taken out of work and was tested for [employer] through [employer] [facility] [employer].   Patient stated that COVID test came back negative but she is still having symptoms.  Patient thinks that she may have had a sinus infection that may have turned into an upper respiratory infection.  Patient is using her inhaler, OTC meds and ibuprofen.  Patient said that she is not getting better. Symptoms are cough, runny nose, sore throat, body aches, low grade fever, wheeziness, and fatigue.  Patient said that she had shortness of breath yesterday but not today.  Patient said that she was advised by [facility] [employer] to follow up with her provider and currently cannot return to work.  Patient said that someone already called to schedule an appt w/ PCP. Pt scheduled for virtual visit with Philis Nettle on tomorrow at 10:00 am.

## 2018-12-01 NOTE — Telephone Encounter (Signed)
Doxy appt scheduled

## 2018-12-01 NOTE — Telephone Encounter (Signed)
Pt returned call and scheduled a F/U Doxy visit for 12/02/2018 at 10:00am with NP Philis Nettle.

## 2018-12-02 ENCOUNTER — Other Ambulatory Visit: Payer: Self-pay | Admitting: Lab

## 2018-12-02 ENCOUNTER — Telehealth: Payer: Self-pay | Admitting: Family Medicine

## 2018-12-02 ENCOUNTER — Other Ambulatory Visit: Payer: Self-pay

## 2018-12-02 ENCOUNTER — Ambulatory Visit (INDEPENDENT_AMBULATORY_CARE_PROVIDER_SITE_OTHER): Payer: No Typology Code available for payment source | Admitting: Family Medicine

## 2018-12-02 ENCOUNTER — Encounter: Payer: Self-pay | Admitting: Family Medicine

## 2018-12-02 DIAGNOSIS — R05 Cough: Secondary | ICD-10-CM

## 2018-12-02 DIAGNOSIS — R0989 Other specified symptoms and signs involving the circulatory and respiratory systems: Secondary | ICD-10-CM | POA: Diagnosis not present

## 2018-12-02 DIAGNOSIS — J988 Other specified respiratory disorders: Secondary | ICD-10-CM | POA: Diagnosis not present

## 2018-12-02 DIAGNOSIS — F17210 Nicotine dependence, cigarettes, uncomplicated: Secondary | ICD-10-CM | POA: Diagnosis not present

## 2018-12-02 DIAGNOSIS — R059 Cough, unspecified: Secondary | ICD-10-CM

## 2018-12-02 MED ORDER — VENLAFAXINE HCL ER 75 MG PO CP24: 75.0000 mg | ORAL_CAPSULE | Freq: Every day | ORAL | 0 refills | Status: DC

## 2018-12-02 MED ORDER — BENZONATATE 100 MG PO CAPS: 100.0000 mg | ORAL_CAPSULE | Freq: Three times a day (TID) | ORAL | 0 refills | Status: DC | PRN

## 2018-12-02 MED ORDER — GUAIFENESIN-CODEINE 100-10 MG/5ML PO SYRP: 10.0000 mL | ORAL_SOLUTION | Freq: Three times a day (TID) | ORAL | 0 refills | Status: DC | PRN

## 2018-12-02 MED ORDER — METHYLPREDNISOLONE 4 MG PO TBPK: ORAL_TABLET | ORAL | 0 refills | Status: DC

## 2018-12-02 MED ORDER — PROAIR RESPICLICK 108 (90 BASE) MCG/ACT IN AEPB: 2.0000 | INHALATION_SPRAY | Freq: Four times a day (QID) | RESPIRATORY_TRACT | 2 refills | Status: DC | PRN

## 2018-12-02 MED ORDER — DOXYCYCLINE HYCLATE 100 MG PO TABS: 100.0000 mg | ORAL_TABLET | Freq: Two times a day (BID) | ORAL | 0 refills | Status: DC

## 2018-12-02 NOTE — Telephone Encounter (Signed)
Medication Refill - Medication: venlafaxine XR (EFFEXOR-XR) 75 MG 24 hr capsule Pharmacy requesting call back to discuss medication dosage please advise. Pt is out   Has the patient contacted their pharmacy? Yes.   (Agent: If no, request that the patient contact the pharmacy for the refill.) (Agent: If yes, when and what did the pharmacy advise?)  Preferred Pharmacy (with phone number or street name):  [facility], [address] [address]  Phone: [phone] Fax: [phone]     Agent: Please be advised that RX refills may take up to 3 business days. We ask that you follow-up with your pharmacy.

## 2018-12-02 NOTE — Telephone Encounter (Signed)
Husband would have to set up new patient appt  I cannot go into a chart of a patient that is not my patient

## 2018-12-02 NOTE — Telephone Encounter (Signed)
Medication Refill - Medication: venlafaxine XR (EFFEXOR-XR) 75 MG 24 hr capsule [[id number]]    Has the patient contacted their pharmacy? No. (Agent: If no, request that the patient contact the pharmacy for the refill.) The patient forgot to mention that she was completley out of thi medication at her appointment this morning.  Preferred Pharmacy (with phone number or street name):  [facility], [place] [address] [phone] (Phone) [phone] (Fax)     Agent: Please be advised that RX refills may take up to 3 business days. We ask that you follow-up with your pharmacy.

## 2018-12-02 NOTE — Telephone Encounter (Signed)
Please schedule follow up in 5-6 days for patient, DOXY  Thanks  LG

## 2018-12-02 NOTE — Telephone Encounter (Signed)
Called Pt to remind her of Doxy visit today. I also told Pt NP Lauren Guse could not access his chart because he is not her Pt. He would have to call and set up a new Pt appt.

## 2018-12-02 NOTE — Telephone Encounter (Signed)
Appt scheduled for 12/10/18 @10 :00am

## 2018-12-02 NOTE — Progress Notes (Signed)
Patient ID: Judy Garcia, female   DOB: [birth year], 48 y.o.   MRN: [id number]    Virtual Visit via video Note  This visit type was conducted due to national recommendations for restrictions regarding the COVID-19 pandemic (e.g. social distancing).  This format is felt to be most appropriate for this patient at this time.  All issues noted in this document were discussed and addressed.  No physical exam was performed (except for noted visual exam findings with Video Visits).   I connected with Judy Garcia today at 10:00 AM EDT by a video enabled telemedicine application or telephone and verified that I am speaking with the correct person using two identifiers. Location patient: home Location provider: work or home office Persons participating in the virtual visit: patient, provider  I discussed the limitations, risks, security and privacy concerns of performing an evaluation and management service by video and the availability of in person appointments. I also discussed with the patient that there may be a patient responsible charge related to this service. The patient expressed understanding and agreed to proceed.  HPI: Patient and I connected via video to discuss symptoms of cough, chest congestion nasal congestion.  Patient has had these symptoms for approximately 1 week.  She was tested on 629 for COVID and her results were NEGATIVE. She is Furniture conservator/restorer.   Patient states throughout this past week she feels her symptoms have remained about the same.  Cough is the worst at night, feels congestion head when trying to lay down to get to sleep.  Has been using her albuterol inhaler 1-2 times a day to help open up her lungs.  Also has been taking Mucinex with some help in reducing congestion.  Denies any fevers or chills.  Denies any nausea, vomiting or diarrhea.   ROS: See pertinent positives and negatives per HPI.  Past Medical History:  Diagnosis Date  . Alcoholism and drug addiction in  family   . Allergy   . Chicken pox   . Depression   . Eating disorder   . GERD (gastroesophageal reflux disease)   . Hypertension   . Ulcer (traumatic) of oral mucosa   . UTI (urinary tract infection)     Past Surgical History:  Procedure Laterality Date  . HERNIA REPAIR    . TONSILLECTOMY    . TUBAL LIGATION    . UTERINE FIBROID SURGERY      Family History  Problem Relation Age of Onset  . Diabetes Mother   . Cancer Maternal Grandmother     Social History   Tobacco Use  . Smoking status: Current Every Day Smoker  . Smokeless tobacco: Never Used  Substance Use Topics  . Alcohol use: Never    Frequency: Never     Current Outpatient Medications:  .  Albuterol Sulfate (PROAIR RESPICLICK) 160 (90 Base) MCG/ACT AEPB, Inhale 2 puffs into the lungs 4 (four) times daily as needed., Disp: 1 each, Rfl: 2 .  Ascorbic Acid (VITAMIN C) 100 MG tablet, Take 100 mg by mouth as needed., Disp: , Rfl:  .  docusate sodium (COLACE) 100 MG capsule, Take 1 capsule (100 mg total) by mouth daily., Disp: 90 capsule, Rfl: 1 .  Multiple Vitamin (MULTIVITAMIN) tablet, Take 1 tablet by mouth daily., Disp: , Rfl:  .  triamterene-hydrochlorothiazide (DYAZIDE) 37.5-25 MG capsule, Take 2 each (2 capsules total) by mouth daily., Disp: 180 capsule, Rfl: 0 .  varenicline (CHANTIX CONTINUING MONTH PAK) 1 MG tablet, Take 1 tablet (1  mg total) by mouth 2 (two) times daily., Disp: 60 tablet, Rfl: 2 .  varenicline (CHANTIX STARTING MONTH PAK) 0.5 MG X 11 & 1 MG X 42 tablet, Take one 0.5 mg tablet by mouth 1x daily for 3 days, then increase to one 0.5 mg tablet 2x daily for 4 days, then increase to 1mg  2x daily., Disp: 53 tablet, Rfl: 0 .  venlafaxine XR (EFFEXOR-XR) 75 MG 24 hr capsule, Take 75 mg by mouth daily with breakfast., Disp: , Rfl:  .  Vitamin D, Ergocalciferol, (DRISDOL) 1.25 MG (50000 UT) CAPS capsule, Take 1 capsule (50,000 Units total) by mouth every 7 (seven) days., Disp: 12 capsule, Rfl: 0 .   potassium chloride (KLOR-CON) 20 MEQ packet, Take 20 mEq by mouth daily for 5 days., Disp: 5 packet, Rfl: 0  EXAM:  GENERAL: alert, oriented, appears well and in no acute distress  HEENT: atraumatic, conjunttiva clear, no obvious abnormalities on inspection of external nose and ears  NECK: normal movements of the head and neck  LUNGS: on inspection no signs of respiratory distress, breathing rate appears normal, no obvious gross SOB, gasping or wheezing. +moist, raspy cough present  CV: no obvious cyanosis  MS: moves all visible extremities without noticeable abnormality  PSYCH/NEURO: pleasant and cooperative, no obvious depression or anxiety, speech and thought processing grossly intact  ASSESSMENT AND PLAN:  Discussed the following assessment and plan:  Respiratory infection, cough, chest congestion, cigarette smoker- patient's COVID-19 testing was negative.  Suspect patient has underlying respiratory infection.  We will cover her with doxycycline twice daily for 10 days and oral steroid taper.  Advised to also use her albuterol inhaler at minimum 2 puffs twice a day for the next week as well as as needed.  Advised I will send in [place] to try as a cough suppressant and also I will send her Robitussin with codeine to take prior to going to sleep at night to help calm cough and allow her to get some rest.  Patient advised that we will take her out of work for at minimum another 7 days and follow-up next week to ensure she is improving.  Advised that if her symptoms do worsen over the weekend, such as shortness of breath gets worse, develops chest pain, has high fever, develops any severe diarrhea or vomiting to go to the ER right away for evaluation.  Patient verbalized understanding.  Patient also encouraged to work on smoking less and eventually quitting smoking.  Advised patient that being a smoker makes her more at risk to become very ill from respiratory type infections.   I  discussed the assessment and treatment plan with the patient. The patient was provided an opportunity to ask questions and all were answered. The patient agreed with the plan and demonstrated an understanding of the instructions.   The patient was advised to call back or seek an in-person evaluation if the symptoms worsen or if the condition fails to improve as anticipated.  We will have patient follow-up early next week to monitor for improvement.  Jodelle Green, FNP

## 2018-12-06 ENCOUNTER — Other Ambulatory Visit: Payer: Self-pay

## 2018-12-06 ENCOUNTER — Ambulatory Visit (INDEPENDENT_AMBULATORY_CARE_PROVIDER_SITE_OTHER): Payer: No Typology Code available for payment source | Admitting: Family Medicine

## 2018-12-06 ENCOUNTER — Other Ambulatory Visit: Payer: Self-pay | Admitting: Family Medicine

## 2018-12-06 ENCOUNTER — Telehealth: Payer: Self-pay | Admitting: Family Medicine

## 2018-12-06 DIAGNOSIS — R059 Cough, unspecified: Secondary | ICD-10-CM

## 2018-12-06 DIAGNOSIS — J988 Other specified respiratory disorders: Secondary | ICD-10-CM

## 2018-12-06 DIAGNOSIS — F329 Major depressive disorder, single episode, unspecified: Secondary | ICD-10-CM

## 2018-12-06 DIAGNOSIS — R05 Cough: Secondary | ICD-10-CM

## 2018-12-06 DIAGNOSIS — Z716 Tobacco abuse counseling: Secondary | ICD-10-CM | POA: Diagnosis not present

## 2018-12-06 DIAGNOSIS — R0989 Other specified symptoms and signs involving the circulatory and respiratory systems: Secondary | ICD-10-CM

## 2018-12-06 DIAGNOSIS — F32A Depression, unspecified: Secondary | ICD-10-CM

## 2018-12-06 MED ORDER — VARENICLINE TARTRATE 1 MG PO TABS: 1.0000 mg | ORAL_TABLET | Freq: Two times a day (BID) | ORAL | 2 refills | Status: DC

## 2018-12-06 MED ORDER — VENLAFAXINE HCL ER 75 MG PO CP24: 225.0000 mg | ORAL_CAPSULE | Freq: Every day | ORAL | 3 refills | Status: DC

## 2018-12-06 NOTE — Progress Notes (Signed)
effexor rx

## 2018-12-06 NOTE — Telephone Encounter (Signed)
Pharmacy called and stated that the instruction on this med is incorrect. Pt told pharmacy that she take 3  Day.  Please advise

## 2018-12-06 NOTE — Telephone Encounter (Signed)
Please schedule 2 month follow up appt  Thanks!  LG

## 2018-12-06 NOTE — Telephone Encounter (Signed)
Pt appt scheduled for 02/08/2019 @ 2:00pm

## 2018-12-06 NOTE — Progress Notes (Signed)
Patient ID: Judy Garcia, female   DOB: [birth year], 48 y.o.   MRN: [id number]    Virtual Visit via video Note  This visit type was conducted due to national recommendations for restrictions regarding the COVID-19 pandemic (e.g. social distancing).  This format is felt to be most appropriate for this patient at this time.  All issues noted in this document were discussed and addressed.  No physical exam was performed (except for noted visual exam findings with Video Visits).   I connected with [name] today at  2:20 PM EDT by a video enabled telemedicine application or telephone and verified that I am speaking with the correct person using two identifiers. Location patient: home Location provider: work or home office Persons participating in the virtual visit: patient, provider  I discussed the limitations, risks, security and privacy concerns of performing an evaluation and management service by video and the availability of in person appointments. I also discussed with the patient that there may be a patient responsible charge related to this service. The patient expressed understanding and agreed to proceed.  HPI:  Patient and I connected via video to follow-up on respiratory infection after treatment with antibiotics, steroid and albuterol inhaler use twice a day.  Patient is feeling much much better.  Chest congestion improved, cough less, does not feel short of breath or wheezy.  Patient had been tested for COVID-19 prior to our visit last week and that was negative.  Denies any fever or chills; aches and energy levels improved.  Patient states "I am feeling better each day, I am almost back to 100% better"  Patient states she did miss a couple of doses of Chantix due to not feeling well last week, and would like to get back on track with taking that and to work on quitting smoking.  Patient states prior to missing 2 days worth of medication, she was up to the 1 mg twice a day dosing.    ROS: See pertinent positives and negatives per HPI.  Past Medical History:  Diagnosis Date  . Alcoholism and drug addiction in family   . Allergy   . Chicken pox   . Depression   . Eating disorder   . GERD (gastroesophageal reflux disease)   . Hypertension   . Ulcer (traumatic) of oral mucosa   . UTI (urinary tract infection)     Past Surgical History:  Procedure Laterality Date  . HERNIA REPAIR    . TONSILLECTOMY    . TUBAL LIGATION    . UTERINE FIBROID SURGERY      Family History  Problem Relation Age of Onset  . Diabetes Mother   . Cancer Maternal Grandmother    Social History   Tobacco Use  . Smoking status: Current Every Day Smoker  . Smokeless tobacco: Never Used  Substance Use Topics  . Alcohol use: Never    Frequency: Never    Current Outpatient Medications:  .  Albuterol Sulfate (PROAIR RESPICLICK) 673 (90 Base) MCG/ACT AEPB, Inhale 2 puffs into the lungs 4 (four) times daily as needed., Disp: 1 each, Rfl: 2 .  Ascorbic Acid (VITAMIN C) 100 MG tablet, Take 100 mg by mouth as needed., Disp: , Rfl:  .  benzonatate (TESSALON) 100 MG capsule, Take 1 capsule (100 mg total) by mouth 3 (three) times daily as needed., Disp: 30 capsule, Rfl: 0 .  docusate sodium (COLACE) 100 MG capsule, Take 1 capsule (100 mg total) by mouth daily., Disp: 90 capsule, Rfl:  1 .  doxycycline (VIBRA-TABS) 100 MG tablet, Take 1 tablet (100 mg total) by mouth 2 (two) times daily., Disp: 20 tablet, Rfl: 0 .  guaiFENesin-codeine (ROBITUSSIN AC) 100-10 MG/5ML syrup, Take 10 mLs by mouth 3 (three) times daily as needed for cough., Disp: 120 mL, Rfl: 0 .  methylPREDNISolone (MEDROL DOSEPAK) 4 MG TBPK tablet, Take according to pack instructions, Disp: 21 tablet, Rfl: 0 .  Multiple Vitamin (MULTIVITAMIN) tablet, Take 1 tablet by mouth daily., Disp: , Rfl:  .  triamterene-hydrochlorothiazide (DYAZIDE) 37.5-25 MG capsule, Take 2 each (2 capsules total) by mouth daily., Disp: 180 capsule, Rfl: 0  .  varenicline (CHANTIX CONTINUING MONTH PAK) 1 MG tablet, Take 1 tablet (1 mg total) by mouth 2 (two) times daily., Disp: 60 tablet, Rfl: 2 .  varenicline (CHANTIX STARTING MONTH PAK) 0.5 MG X 11 & 1 MG X 42 tablet, Take one 0.5 mg tablet by mouth 1x daily for 3 days, then increase to one 0.5 mg tablet 2x daily for 4 days, then increase to 1mg  2x daily., Disp: 53 tablet, Rfl: 0 .  venlafaxine XR (EFFEXOR-XR) 75 MG 24 hr capsule, Take 3 capsules (225 mg total) by mouth daily with breakfast., Disp: 270 capsule, Rfl: 3 .  Vitamin D, Ergocalciferol, (DRISDOL) 1.25 MG (50000 UT) CAPS capsule, Take 1 capsule (50,000 Units total) by mouth every 7 (seven) days., Disp: 12 capsule, Rfl: 0 .  potassium chloride (KLOR-CON) 20 MEQ packet, Take 20 mEq by mouth daily for 5 days., Disp: 5 packet, Rfl: 0  EXAM:  GENERAL: alert, oriented, appears well and in no acute distress  HEENT: atraumatic, conjunttiva clear, no obvious abnormalities on inspection of external nose and ears  NECK: normal movements of the head and neck  LUNGS: on inspection no signs of respiratory distress, breathing rate appears normal, no obvious gross SOB, gasping or wheezing. Voice sounds much better, no congested cough heard when we are speaking over video feed.   CV: no obvious cyanosis  MS: moves all visible extremities without noticeable abnormality  PSYCH/NEURO: pleasant and cooperative, no obvious depression or anxiety, speech and thought processing grossly intact  ASSESSMENT AND PLAN:  Discussed the following assessment and plan:  Respiratory infection, cough, chest congestion-patient is improving and sounds much better today than compared to last week.  Advised to finish antibiotics and steroids as prescribed and use albuterol inhaler as needed.  We will keep her out of work all of this week and have her return to work next week.  Work note given releasing patient back to work.  Smoking cessation-greater than 3 minutes  spent discussing smoking cessation.  Advised patient that missing a couple of days worth of medication should not throw her off.  We will have her begin 1 mg once daily for 3 days, then bumped dosing back up to 1 mg twice a day to get her back on track.  We will plan for patient to follow-up in approximately 2 months for recheck on smoking cessation   I discussed the assessment and treatment plan with the patient. The patient was provided an opportunity to ask questions and all were answered. The patient agreed with the plan and demonstrated an understanding of the instructions.   The patient was advised to call back or seek an in-person evaluation if the symptoms worsen or if the condition fails to improve as anticipated.  Jodelle Green, FNP

## 2018-12-06 NOTE — Telephone Encounter (Signed)
Rx corrected.

## 2018-12-07 ENCOUNTER — Encounter: Payer: Self-pay | Admitting: Family Medicine

## 2018-12-09 ENCOUNTER — Ambulatory Visit: Payer: No Typology Code available for payment source | Admitting: Family Medicine

## 2018-12-10 ENCOUNTER — Ambulatory Visit: Payer: No Typology Code available for payment source | Admitting: Family Medicine

## 2018-12-23 ENCOUNTER — Other Ambulatory Visit: Payer: Self-pay | Admitting: Family Medicine

## 2018-12-23 DIAGNOSIS — I10 Essential (primary) hypertension: Secondary | ICD-10-CM

## 2018-12-24 ENCOUNTER — Ambulatory Visit (INDEPENDENT_AMBULATORY_CARE_PROVIDER_SITE_OTHER): Payer: No Typology Code available for payment source | Admitting: Family Medicine

## 2018-12-24 ENCOUNTER — Other Ambulatory Visit: Payer: Self-pay

## 2018-12-24 DIAGNOSIS — Z716 Tobacco abuse counseling: Secondary | ICD-10-CM

## 2018-12-24 DIAGNOSIS — I10 Essential (primary) hypertension: Secondary | ICD-10-CM

## 2018-12-24 DIAGNOSIS — F17210 Nicotine dependence, cigarettes, uncomplicated: Secondary | ICD-10-CM | POA: Diagnosis not present

## 2018-12-24 DIAGNOSIS — B373 Candidiasis of vulva and vagina: Secondary | ICD-10-CM | POA: Diagnosis not present

## 2018-12-24 DIAGNOSIS — B3731 Acute candidiasis of vulva and vagina: Secondary | ICD-10-CM

## 2018-12-24 MED ORDER — NICOTINE 14 MG/24HR TD PT24: 14.0000 mg | MEDICATED_PATCH | Freq: Every day | TRANSDERMAL | 0 refills | Status: DC

## 2018-12-24 MED ORDER — FLUCONAZOLE 150 MG PO TABS: 150.0000 mg | ORAL_TABLET | Freq: Once | ORAL | 0 refills | Status: AC

## 2018-12-24 MED ORDER — TRIAMTERENE-HCTZ 37.5-25 MG PO CAPS: 2.0000 | ORAL_CAPSULE | Freq: Every day | ORAL | 1 refills | Status: DC

## 2018-12-24 MED ORDER — NICOTINE 7 MG/24HR TD PT24: 7.0000 mg | MEDICATED_PATCH | Freq: Every day | TRANSDERMAL | 1 refills | Status: DC

## 2018-12-24 NOTE — Progress Notes (Signed)
Patient ID: Judy Garcia, female   DOB: 09/28/1970, 48 y.o.   MRN: [id number]    Virtual Visit via video Note  This visit type was conducted due to national recommendations for restrictions regarding the COVID-19 pandemic (e.g. social distancing).  This format is felt to be most appropriate for this patient at this time.  All issues noted in this document were discussed and addressed.  No physical exam was performed (except for noted visual exam findings with Video Visits).   I connected with [name] today at 10:40 AM EDT by a video enabled telemedicine application and verified that I am speaking with the correct person using two identifiers. Location patient: home Location provider: work or home office Persons participating in the virtual visit: patient, provider  I discussed the limitations, risks, security and privacy concerns of performing an evaluation and management service by telephone and the availability of in person appointments. I also discussed with the patient that there may be a patient responsible charge related to this service. The patient expressed understanding and agreed to proceed.   HPI:  Patient and I connected via video home for medication refill and to follow-up on smoking cessation.  Patient has been tolerating her triamterene hydrochlorothiazide for BP control without any problems.  Blood pressure runs in the 130s over 80s consistently.  She does have an office appointment coming up in September and we are planning to check new blood work at that time.  Denies any chest pain, palpitations or lower extremity swelling.  She also is conscious of the amount of salt intake in her diet.  Patient has been taking Chantix to help with smoking cessation however over the past many weeks she has noticed that medication giving her weird dreams does not like how it is making her feel.  She has chosen to stop the medicine.  She had been on Wellbutrin previously to help with  smoking however when taking the Wellbutrin she was not also taking Effexor for her mood.  Patient is very interested in alternative smoking cessation techniques/strategy.  Patient also reports a little bit of vaginal itching and is concerned about possible yeast infection.  She did take a course of antibiotic and prednisone earlier this month so it is quite possible she has yeast infection.  Denies any possibility of STD or any burning with urination that would make her think she has UTI.    ROS: See pertinent positives and negatives per HPI.  Past Medical History:  Diagnosis Date  . Alcoholism and drug addiction in family   . Allergy   . Chicken pox   . Depression   . Eating disorder   . GERD (gastroesophageal reflux disease)   . Hypertension   . Ulcer (traumatic) of oral mucosa   . UTI (urinary tract infection)     Past Surgical History:  Procedure Laterality Date  . HERNIA REPAIR    . TONSILLECTOMY    . TUBAL LIGATION    . UTERINE FIBROID SURGERY      Family History  Problem Relation Age of Onset  . Diabetes Mother   . Cancer Maternal Grandmother    Social History   Tobacco Use  . Smoking status: Current Every Day Smoker  . Smokeless tobacco: Never Used  Substance Use Topics  . Alcohol use: Never    Frequency: Never    Current Outpatient Medications:  .  Albuterol Sulfate (PROAIR RESPICLICK) 240 (90 Base) MCG/ACT AEPB, Inhale 2 puffs into the lungs 4 (four)  times daily as needed., Disp: 1 each, Rfl: 2 .  Ascorbic Acid (VITAMIN C) 100 MG tablet, Take 100 mg by mouth as needed., Disp: , Rfl:  .  benzonatate (TESSALON) 100 MG capsule, Take 1 capsule (100 mg total) by mouth 3 (three) times daily as needed., Disp: 30 capsule, Rfl: 0 .  docusate sodium (COLACE) 100 MG capsule, Take 1 capsule (100 mg total) by mouth daily., Disp: 90 capsule, Rfl: 1 .  doxycycline (VIBRA-TABS) 100 MG tablet, Take 1 tablet (100 mg total) by mouth 2 (two) times daily., Disp: 20 tablet, Rfl:  0 .  guaiFENesin-codeine (ROBITUSSIN AC) 100-10 MG/5ML syrup, Take 10 mLs by mouth 3 (three) times daily as needed for cough., Disp: 120 mL, Rfl: 0 .  methylPREDNISolone (MEDROL DOSEPAK) 4 MG TBPK tablet, Take according to pack instructions, Disp: 21 tablet, Rfl: 0 .  Multiple Vitamin (MULTIVITAMIN) tablet, Take 1 tablet by mouth daily., Disp: , Rfl:  .  triamterene-hydrochlorothiazide (DYAZIDE) 37.5-25 MG capsule, Take 2 each (2 capsules total) by mouth daily., Disp: 180 capsule, Rfl: 0 .  varenicline (CHANTIX CONTINUING MONTH PAK) 1 MG tablet, Take 1 tablet (1 mg total) by mouth 2 (two) times daily., Disp: 60 tablet, Rfl: 2 .  venlafaxine XR (EFFEXOR-XR) 75 MG 24 hr capsule, Take 3 capsules (225 mg total) by mouth daily with breakfast., Disp: 270 capsule, Rfl: 3 .  Vitamin D, Ergocalciferol, (DRISDOL) 1.25 MG (50000 UT) CAPS capsule, Take 1 capsule (50,000 Units total) by mouth every 7 (seven) days., Disp: 12 capsule, Rfl: 0 .  potassium chloride (KLOR-CON) 20 MEQ packet, Take 20 mEq by mouth daily for 5 days., Disp: 5 packet, Rfl: 0  EXAM:  GENERAL: alert, oriented, appears well and in no acute distress  HEENT: atraumatic, conjunttiva clear, no obvious abnormalities on inspection of external nose and ears  NECK: normal movements of the head and neck  LUNGS: on inspection no signs of respiratory distress, breathing rate appears normal, no obvious gross SOB, gasping or wheezing  CV: no obvious cyanosis  MS: moves all visible extremities without noticeable abnormality  PSYCH/NEURO: pleasant and cooperative, no obvious depression or anxiety, speech and thought processing grossly intact  ASSESSMENT AND PLAN:  Discussed the following assessment and plan:  Essential hypertension-patient will continue triamterene hydrochlorothiazide as this medication is working well to keep BP under good control if she tolerates that without issues.  Vaginal Candida-suspect vaginal itch is related to  yeast infection.  She will take Diflucan x1.  Advised if vaginal symptoms do persist, we then will bring her in for a pelvic exam and urine sample testing  Smoking cessation-greater than 3 minutes spent discussing different options for smoking cessation.  Patient no longer wants to take Chantix and has stopped this medication.  Advised that I cannot prescribe Wellbutrin on top of the Effexor as these drugs are too similar.  Patient interested in trying the nicotine patches.  She will wear 14 mg nicotine patch daily for 6 weeks and then wean down to 7 mg patch daily for 2 to 4 weeks.  Advised that she cannot smoke cigarettes while wearing the nicotine patch.   I discussed the assessment and treatment plan with the patient. The patient was provided an opportunity to ask questions and all were answered. The patient agreed with the plan and demonstrated an understanding of the instructions.   The patient was advised to call back or seek an in-person evaluation if the symptoms worsen or if the condition fails  to improve as anticipated.  She will keep appointment as planned in early September 2020, this is going to be in office appointment so we can do blood work at that time.  Jodelle Green, FNP

## 2019-01-01 ENCOUNTER — Encounter: Payer: Self-pay | Admitting: Family Medicine

## 2019-01-08 ENCOUNTER — Encounter: Payer: Self-pay | Admitting: Family Medicine

## 2019-01-27 ENCOUNTER — Telehealth: Payer: No Typology Code available for payment source | Admitting: Family Medicine

## 2019-01-27 NOTE — Progress Notes (Deleted)
Patient ID: Judy Garcia, female   DOB: 08-16-1970, 48 y.o.   MRN: [id number]    Virtual Visit via video Note  This visit type was conducted due to national recommendations for restrictions regarding the COVID-19 pandemic (e.g. social distancing).  This format is felt to be most appropriate for this patient at this time.  All issues noted in this document were discussed and addressed.  No physical exam was performed (except for noted visual exam findings with Video Visits).   I connected with Judy Garcia today at 10:20 AM EDT by a video enabled telemedicine application or telephone and verified that I am speaking with the correct person using two identifiers. Location patient: home Location provider: work or home office Persons participating in the virtual visit: patient, provider  I discussed the limitations, risks, security and privacy concerns of performing an evaluation and management service by video and the availability of in person appointments. I also discussed with the patient that there may be a patient responsible charge related to this service. The patient expressed understanding and agreed to proceed.   HPI:  Patient and I connected via video due to pain left upper/mid back.  Patient believes the pain potentially is related to her large breast size.  She has been using ibuprofen to help pain, but taking ibuprofen seems to be upsetting stomach.     ROS: See pertinent positives and negatives per HPI.  Past Medical History:  Diagnosis Date  . Alcoholism and drug addiction in family   . Allergy   . Chicken pox   . Depression   . Eating disorder   . GERD (gastroesophageal reflux disease)   . Hypertension   . Ulcer (traumatic) of oral mucosa   . UTI (urinary tract infection)     Past Surgical History:  Procedure Laterality Date  . HERNIA REPAIR    . TONSILLECTOMY    . TUBAL LIGATION    . UTERINE FIBROID SURGERY      Family History  Problem Relation Age of  Onset  . Diabetes Mother   . Cancer Maternal Grandmother    Social History   Tobacco Use  . Smoking status: Current Every Day Smoker  . Smokeless tobacco: Never Used  Substance Use Topics  . Alcohol use: Never    Frequency: Never     Current Outpatient Medications:  .  Albuterol Sulfate (PROAIR RESPICLICK) 123XX123 (90 Base) MCG/ACT AEPB, Inhale 2 puffs into the lungs 4 (four) times daily as needed., Disp: 1 each, Rfl: 2 .  Ascorbic Acid (VITAMIN C) 100 MG tablet, Take 100 mg by mouth as needed., Disp: , Rfl:  .  docusate sodium (COLACE) 100 MG capsule, Take 1 capsule (100 mg total) by mouth daily., Disp: 90 capsule, Rfl: 1 .  Multiple Vitamin (MULTIVITAMIN) tablet, Take 1 tablet by mouth daily., Disp: , Rfl:  .  nicotine (NICODERM CQ) 14 mg/24hr patch, Place 1 patch (14 mg total) onto the skin daily. Do this for 6 weeks., Disp: 42 patch, Rfl: 0 .  nicotine (NICODERM CQ) 7 mg/24hr patch, Place 1 patch (7 mg total) onto the skin daily. Do this for 2-4 weeks after finishing the 14mg /24hr patch dose., Disp: 14 patch, Rfl: 1 .  potassium chloride (KLOR-CON) 20 MEQ packet, Take 20 mEq by mouth daily for 5 days., Disp: 5 packet, Rfl: 0 .  triamterene-hydrochlorothiazide (DYAZIDE) 37.5-25 MG capsule, Take 2 each (2 capsules total) by mouth daily., Disp: 180 capsule, Rfl: 1 .  venlafaxine XR (EFFEXOR-XR)  75 MG 24 hr capsule, Take 3 capsules (225 mg total) by mouth daily with breakfast., Disp: 270 capsule, Rfl: 3 .  Vitamin D, Ergocalciferol, (DRISDOL) 1.25 MG (50000 UT) CAPS capsule, Take 1 capsule (50,000 Units total) by mouth every 7 (seven) days., Disp: 12 capsule, Rfl: 0  EXAM:  GENERAL: alert, oriented, appears well and in no acute distress  HEENT: atraumatic, conjunttiva clear, no obvious abnormalities on inspection of external nose and ears  NECK: normal movements of the head and neck  LUNGS: on inspection no signs of respiratory distress, breathing rate appears normal, no obvious gross  SOB, gasping or wheezing  CV: no obvious cyanosis  MS: moves all visible extremities without noticeable abnormality  PSYCH/NEURO: pleasant and cooperative, no obvious depression or anxiety, speech and thought processing grossly intact  ASSESSMENT AND PLAN:  Discussed the following assessment and plan:  No problem-specific Assessment & Plan notes found for this encounter.    I discussed the assessment and treatment plan with the patient. The patient was provided an opportunity to ask questions and all were answered. The patient agreed with the plan and demonstrated an understanding of the instructions.   The patient was advised to call back or seek an in-person evaluation if the symptoms worsen or if the condition fails to improve as anticipated.  I provided *** minutes of non-face-to-face time during this encounter.   Jodelle Green, FNP

## 2019-01-28 ENCOUNTER — Ambulatory Visit: Payer: No Typology Code available for payment source

## 2019-02-08 ENCOUNTER — Ambulatory Visit: Payer: No Typology Code available for payment source | Admitting: Family Medicine

## 2019-02-08 ENCOUNTER — Encounter: Payer: Self-pay | Admitting: Family Medicine

## 2019-02-08 DIAGNOSIS — Z0289 Encounter for other administrative examinations: Secondary | ICD-10-CM

## 2019-02-08 NOTE — Telephone Encounter (Signed)
Please book a different appt for patient  And also let patient know that sending a mychart message to cancel appt same day is not the best way, calling is better, as we do not get to the mychart messages right away when they come in

## 2019-02-08 NOTE — Telephone Encounter (Signed)
Lm to call office to resched

## 2019-02-10 ENCOUNTER — Encounter: Payer: Self-pay | Admitting: Family Medicine

## 2019-02-10 NOTE — Telephone Encounter (Signed)
Called Pt back and scheduled her for 02/11/19 @ 8:00am OV back pain and chest pain

## 2019-02-11 ENCOUNTER — Other Ambulatory Visit: Payer: Self-pay

## 2019-02-11 ENCOUNTER — Ambulatory Visit (INDEPENDENT_AMBULATORY_CARE_PROVIDER_SITE_OTHER): Payer: No Typology Code available for payment source | Admitting: Family Medicine

## 2019-02-11 VITALS — BP 138/78 | HR 74 | Temp 96.4°F | Resp 18 | Ht 61.0 in | Wt 234.2 lb

## 2019-02-11 DIAGNOSIS — M549 Dorsalgia, unspecified: Secondary | ICD-10-CM

## 2019-02-11 DIAGNOSIS — D171 Benign lipomatous neoplasm of skin and subcutaneous tissue of trunk: Secondary | ICD-10-CM

## 2019-02-11 DIAGNOSIS — Z1211 Encounter for screening for malignant neoplasm of colon: Secondary | ICD-10-CM

## 2019-02-11 DIAGNOSIS — Z1231 Encounter for screening mammogram for malignant neoplasm of breast: Secondary | ICD-10-CM

## 2019-02-11 DIAGNOSIS — M62838 Other muscle spasm: Secondary | ICD-10-CM | POA: Diagnosis not present

## 2019-02-11 DIAGNOSIS — D170 Benign lipomatous neoplasm of skin and subcutaneous tissue of head, face and neck: Secondary | ICD-10-CM

## 2019-02-11 MED ORDER — CYCLOBENZAPRINE HCL 5 MG PO TABS: 5.0000 mg | ORAL_TABLET | Freq: Three times a day (TID) | ORAL | 1 refills | Status: DC | PRN

## 2019-02-11 MED ORDER — METHYLPREDNISOLONE 4 MG PO TBPK: ORAL_TABLET | ORAL | 0 refills | Status: DC

## 2019-02-11 MED ORDER — METHYLPREDNISOLONE ACETATE 40 MG/ML IJ SUSP
40.0000 mg | Freq: Once | INTRAMUSCULAR | Status: AC
  Administered 2019-02-11: 40 mg via INTRAMUSCULAR

## 2019-02-11 MED ORDER — KETOROLAC TROMETHAMINE 60 MG/2ML IM SOLN
60.0000 mg | Freq: Once | INTRAMUSCULAR | Status: AC
  Administered 2019-02-11: 60 mg via INTRAMUSCULAR

## 2019-02-11 NOTE — Progress Notes (Signed)
Subjective:    Patient ID: Judy Garcia, female    DOB: Apr 28, 1971, 48 y.o.   MRN: [id number]  HPI   Patient presents to clinic complaining of upper back pain.  This is been present for about 2 months.  Patient has hurt her left trapezius muscle many years ago, states she actually did tear this muscle and responded well to physical therapy treatment.  Does not believe she tore the muscle again, but is having pain in the same area, mainly near her left shoulder blade.  States notices the pain a lot when she reaches arm straight up above head or straight out in front of her.  Tries to do stretching throughout the day to help keep muscles from becoming too stiff.  Denies numbness or tingling in extremities.  Denies shortness of breath or wheezing.  Denies palpitations or cardiac type chest pain.  Denies fever or chills.  Also needs a referral to GI for colonoscopy, new referral to dermatology for her lipoma on face be removed, and needs a new order for her mammogram.  Patient Active Problem List   Diagnosis Date Noted  . Cigarette smoker 07/28/2018  . Lipoma of face 07/28/2018  . Thickened nails 07/28/2018  . Essential hypertension 07/28/2018  . History of alcohol abuse 07/28/2018  . Personal history of drug abuse ([place]) 07/28/2018  . Menopausal symptoms 07/28/2018  . Depressive disorder 07/28/2018   Social History   Tobacco Use  . Smoking status: Current Every Day Smoker  . Smokeless tobacco: Never Used  Substance Use Topics  . Alcohol use: Never    Frequency: Never    Review of Systems  Constitutional: Negative for chills, fatigue and fever.  HENT: Negative for congestion, ear pain, sinus pain and sore throat.   Eyes: Negative.   Respiratory: Negative for cough, shortness of breath and wheezing.   Cardiovascular: Negative for chest pain, palpitations and leg swelling.  Gastrointestinal: Negative for abdominal pain, diarrhea, nausea and vomiting.  Genitourinary: Negative for  dysuria, frequency and urgency.  Musculoskeletal: upper back pain Skin: Negative for color change, pallor and rash.  Neurological: Negative for syncope, light-headedness and headaches.  Psychiatric/Behavioral: The patient is not nervous/anxious.       Objective:   Physical Exam Vitals signs and nursing note reviewed.  Constitutional:      General: She is not in acute distress.    Appearance: She is not toxic-appearing.  HENT:     Head: Normocephalic and atraumatic.      Comments: Lipoma locations represented by red on diagram Eyes:     General: No scleral icterus.    Extraocular Movements: Extraocular movements intact.     Pupils: Pupils are equal, round, and reactive to light.  Neck:     Musculoskeletal: Normal range of motion. No neck rigidity.  Cardiovascular:     Rate and Rhythm: Normal rate and regular rhythm.     Heart sounds: Normal heart sounds.  Pulmonary:     Effort: Pulmonary effort is normal. No respiratory distress.     Breath sounds: Normal breath sounds.  Musculoskeletal:       Back:     Comments: Location of left-sided upper back pain indicated by red mark on diagram.  Pain reproduced with palpation of muscular area.  No pain with palpation of spine.  Range of motion of neck intact.  Able to lift both arms straight up above head straight out in front of her and reach behind her without any range of  motion difficulty.  Skin:    General: Skin is warm and dry.     Coloration: Skin is not jaundiced or pale.  Neurological:     General: No focal deficit present.     Mental Status: She is alert and oriented to person, place, and time.  Psychiatric:        Mood and Affect: Mood normal.        Behavior: Behavior normal.       Today's Vitals   02/11/19 0811  BP: 138/78  Pulse: 74  Resp: 18  Temp: (!) 96.4 F (35.8 C)  TempSrc: Temporal  SpO2: 96%  Weight: 234 lb 3.2 oz (106.2 kg)  Height: 5\' 1"  (1.549 m)   Body mass index is 44.25 kg/m.      Assessment & Plan:    1. Lipoma of face  - Ambulatory referral to Dermatology  2. Lipoma of back  - Ambulatory referral to Dermatology  3. Colon cancer screening  - Ambulatory referral to Gastroenterology  4. Muscle spasm  IM steroid and Toradol in clinic to help treat pain. Will take oral steroid taper and muscle relaxer PRN   Referral to PT  Administrations This Visit    ketorolac (TORADOL) injection 60 mg    Admin Date 02/11/2019 Action Given Dose 60 mg Route Intramuscular Administered By Neta Ehlers, RMA       methylPREDNISolone acetate (DEPO-MEDROL) injection 40 mg    Admin Date 02/11/2019 Action Given Dose 40 mg Route Intramuscular Administered By Neta Ehlers, RMA         - methylPREDNISolone (MEDROL DOSEPAK) 4 MG TBPK tablet; Take according to pack instructions  Dispense: 21 tablet; Refill: 0 - cyclobenzaprine (FLEXERIL) 5 MG tablet; Take 1 tablet (5 mg total) by mouth 3 (three) times daily as needed for muscle spasms.  Dispense: 30 tablet; Refill: 1 - Ambulatory referral to Physical Therapy  5. Upper back pain on left side  - methylPREDNISolone (MEDROL DOSEPAK) 4 MG TBPK tablet; Take according to pack instructions  Dispense: 21 tablet; Refill: 0 - cyclobenzaprine (FLEXERIL) 5 MG tablet; Take 1 tablet (5 mg total) by mouth 3 (three) times daily as needed for muscle spasms.  Dispense: 30 tablet; Refill: 1 - Ambulatory referral to Physical Therapy - methylPREDNISolone acetate (DEPO-MEDROL) injection 40 mg - ketorolac (TORADOL) injection 60 mg  6. Screening mammogram, encounter for  - MM Digital Screening; Future   Patient will call to get her mammogram set up.  She is aware someone will reach out to her in regards to gastroenterology and dermatology appointments.  I am hopeful physical therapy will help with upper back pain as well as the steroid taper and muscle relaxer.  Also advised she can do her stretches at home, topical rubs and also  heating pad to soothe pain.

## 2019-02-25 ENCOUNTER — Encounter: Payer: Self-pay | Admitting: *Deleted

## 2019-03-03 ENCOUNTER — Encounter: Payer: Self-pay | Admitting: Family Medicine

## 2019-03-16 ENCOUNTER — Encounter: Payer: Self-pay | Admitting: Family Medicine

## 2019-03-17 NOTE — Telephone Encounter (Signed)
Pt looking for referral update

## 2019-03-17 NOTE — Telephone Encounter (Signed)
The patient was scheduled for an appointment on 3.24.20.

## 2019-03-17 NOTE — Telephone Encounter (Signed)
New referrals placed in September 2020 due to missing march appt

## 2019-04-22 ENCOUNTER — Other Ambulatory Visit: Payer: Self-pay

## 2019-04-22 ENCOUNTER — Ambulatory Visit (INDEPENDENT_AMBULATORY_CARE_PROVIDER_SITE_OTHER)
Admission: RE | Admit: 2019-04-22 | Discharge: 2019-04-22 | Disposition: A | Discharge status: Home / Self Care | Payer: No Typology Code available for payment source | Source: Ambulatory Visit

## 2019-04-22 DIAGNOSIS — J988 Other specified respiratory disorders: Secondary | ICD-10-CM

## 2019-04-22 DIAGNOSIS — R059 Cough, unspecified: Secondary | ICD-10-CM

## 2019-04-22 DIAGNOSIS — J069 Acute upper respiratory infection, unspecified: Secondary | ICD-10-CM | POA: Diagnosis not present

## 2019-04-22 DIAGNOSIS — R0989 Other specified symptoms and signs involving the circulatory and respiratory systems: Secondary | ICD-10-CM

## 2019-04-22 DIAGNOSIS — R05 Cough: Secondary | ICD-10-CM

## 2019-04-22 MED ORDER — PROAIR RESPICLICK 108 (90 BASE) MCG/ACT IN AEPB: 2.0000 | INHALATION_SPRAY | Freq: Four times a day (QID) | RESPIRATORY_TRACT | 0 refills | Status: DC | PRN

## 2019-04-22 MED ORDER — PREDNISONE 10 MG (21) PO TBPK: ORAL_TABLET | Freq: Every day | ORAL | 0 refills | Status: DC

## 2019-04-22 NOTE — ED Provider Notes (Signed)
Virtual Visit via Video Note:  Judy Garcia  initiated request for Telemedicine visit with Judy Garcia. I connected with Judy Garcia  on [id number] at 1:21 PM  for a synchronized telemedicine visit using a video enabled HIPPA compliant telemedicine application. I verified that I am speaking with Judy Garcia  using two identifiers. Sharion Balloon, NP  was physically located in a [facility] Urgent care site and Dot Dahler was located at a different location.   The limitations of evaluation and management by telemedicine as well as the availability of in-person appointments were discussed. Patient was informed that she  may incur a bill ( including co-pay) for this virtual visit encounter. Judy Garcia  expressed understanding and gave verbal consent to proceed with virtual visit.     History of Present Illness:Judy Garcia  is a 48 y.o. female presents for evaluation of 1 week history of nonproductive cough, chest congestion, and post nasal drip.  She attempted treatment at home with Tessalon and Mucinex.  She denies fever, chills, rash, sore throat, vomiting, diarrhea, or other symptoms.     No Known Allergies   Past Medical History:  Diagnosis Date  . Alcoholism and drug addiction in family   . Allergy   . Chicken pox   . Depression   . Eating disorder   . GERD (gastroesophageal reflux disease)   . Hypertension   . Ulcer (traumatic) of oral mucosa   . UTI (urinary tract infection)      Social History   Tobacco Use  . Smoking status: Current Every Day Smoker  . Smokeless tobacco: Never Used  Substance Use Topics  . Alcohol use: Never    Frequency: Never  . Drug use: Never        Observations/Objective: Physical Exam  VITALS: Patient denies fever. GENERAL: Alert, in no acute distress. HEENT: Atraumatic. NECK: Normal movements of the head and neck. CARDIOPULMONARY: Tight nonproductive cough.  No increased WOB. Speaking in clear sentences.  I:E ratio WNL.  MS: Moves all visible extremities without noticeable abnormality. PSYCH: Pleasant and cooperative, well-groomed. Speech normal rate and rhythm. Affect is appropriate. Insight and judgement are appropriate. Attention is focused, linear, and appropriate.  NEURO: CN grossly intact. Oriented as arrived to appointment on time with no prompting. Moves both UE equally.   Assessment and Plan:    ICD-10-CM   1. Acute upper respiratory infection  J06.9   2. Respiratory infection  J98.8 Albuterol Sulfate (PROAIR RESPICLICK) 123XX123 (90 Base) MCG/ACT AEPB  3. Cough  R05 Albuterol Sulfate (PROAIR RESPICLICK) 123XX123 (90 Base) MCG/ACT AEPB  4. Chest congestion  R09.89 Albuterol Sulfate (PROAIR RESPICLICK) 123XX123 (90 Base) MCG/ACT AEPB       Follow Up Instructions: Treating with albuterol and prednisone taper.  Discussed with patient that she should consider having a Covid test done today.  Also discussed that she should plan to be seen in person at the urgent care if her symptoms worsen over the weekend or if she is not getting better by Monday.  Patient agrees to plan of care.    I discussed the assessment and treatment plan with the patient. The patient was provided an opportunity to ask questions and all were answered. The patient agreed with the plan and demonstrated an understanding of the instructions.   The patient was advised to call back or seek an in-person evaluation if the symptoms worsen or if the condition fails to improve as anticipated.  Sharion Balloon, NP  04/22/2019 1:21 PM         Sharion Balloon, NP 04/22/19 1323

## 2019-04-22 NOTE — Discharge Instructions (Addendum)
Use the albuterol inhaler and take the prednisone as directed.    Come to the urgent care to be seen in person if your symptoms are not improving or get worse.    Consider having a COVID test today.

## 2019-08-01 ENCOUNTER — Telehealth: Payer: Self-pay | Admitting: *Deleted

## 2019-08-01 NOTE — Telephone Encounter (Signed)
Tried to reach patient by phone no answer and voicemail is full, patient needs to establish with a provider for medication refills.

## 2019-08-03 NOTE — Telephone Encounter (Signed)
Patient has an upcoming appointment are you ok with filling triamterene-hydrochlorothiazide until appointment on 38/21? Last Labs were 10/07/18 Patient Potassium was at 3.0.

## 2019-08-03 NOTE — Telephone Encounter (Signed)
Pt called and has scheduled a TOC appt with Jerelene Redden on 08/08/19 Pt needs her triamterene-hydrochlorothiazide (DYAZIDE) 37.5-25 MG capsule refilled and sent to [facility]

## 2019-08-08 ENCOUNTER — Other Ambulatory Visit: Payer: Self-pay

## 2019-08-08 ENCOUNTER — Telehealth: Payer: Self-pay | Admitting: Lab

## 2019-08-08 ENCOUNTER — Telehealth: Payer: Self-pay | Admitting: Nurse Practitioner

## 2019-08-08 ENCOUNTER — Telehealth: Payer: No Typology Code available for payment source | Admitting: Nurse Practitioner

## 2019-08-08 VITALS — BP 120/99 | Ht 61.0 in | Wt 223.0 lb

## 2019-08-08 DIAGNOSIS — I10 Essential (primary) hypertension: Secondary | ICD-10-CM

## 2019-08-08 DIAGNOSIS — Z86018 Personal history of other benign neoplasm: Secondary | ICD-10-CM

## 2019-08-08 DIAGNOSIS — F32A Depression, unspecified: Secondary | ICD-10-CM

## 2019-08-08 DIAGNOSIS — F329 Major depressive disorder, single episode, unspecified: Secondary | ICD-10-CM

## 2019-08-08 DIAGNOSIS — M546 Pain in thoracic spine: Secondary | ICD-10-CM

## 2019-08-08 MED ORDER — TRIAMTERENE-HCTZ 37.5-25 MG PO CAPS: 2.0000 | ORAL_CAPSULE | Freq: Every day | ORAL | 0 refills | Status: DC

## 2019-08-08 NOTE — Telephone Encounter (Signed)
Called Pt home number and cell 2x times, No answer, left a VM to call office that she had a 4:00pm Virtual visit today. I also sent the Pt the Virtual visit Link 2x times and she did not link on.

## 2019-08-08 NOTE — Telephone Encounter (Signed)
Called pt to update insurance information. Also need to verify home number in system.

## 2019-08-08 NOTE — Progress Notes (Signed)
Interactive audio and video telecommunications were attempted between this provider and patient, however failed, due to patient having technical difficulties. We continued and completed visit with audio only.   I connected with Judy Garcia by telephone and verified that I am speaking with the correct person using two identifiers.   Location:  Patient: home Provider: work    I discussed the limitations, risks, security and privacy concerns of performing an evaluation and management service by telephone and the availability of in person appointments. I also discussed with the patient that there may be a patient responsible charge related to this service. The patient expressed understanding and agreed to proceed.    History of Present Illness:   1. HTN: She ran out of dyazide 37.5-25 mg one daily 1 week ago.No CP, DOE, chronic edema.   2. Back pain: She has been taking Ibuprofen 800 mg twice a day for upper left side  back pain since she ran out of flexeril. Denies numbness/tingling in extremities.   She was seen 02/11/2019 for 2 month hx of back pain and given Dep- Medrol 40 mg IM  and Toradol 60 mg IM injections along with Medrol Dosepak 4 mg tablet disp 21 tabs, and flexeril 5 mg TID as needed with referral to PT.   3. She has lipoma back again on her back. Maybe another one on her scalp. She was seen in DERMATOLOGY last year for the same.   4. Morbid obesity: Wt reported 223 lb. Wt Readings from Last 3 Encounters:  08/08/19 223 lb (101.2 kg)  02/11/19 234 lb 3.2 oz (106.2 kg)  10/07/18 231 lb (104.8 kg)   5. Depression on Effexor- reports stable  LMP: 10 years ago Colonoscopy- was it done?  PAP 2020 Mammo- not done    Past Medical History:  Diagnosis Date  . Alcoholism and drug addiction in family   . Allergy   . Back pain 08/09/2019  . Chicken pox   . Depression   . Eating disorder   . GERD (gastroesophageal reflux disease)   . History of lipoma 08/09/2019  . Hypertension    . Ulcer (traumatic) of oral mucosa   . UTI (urinary tract infection)      Social History   Socioeconomic History  . Marital status: Married    Spouse name: Not on file  . Number of children: Not on file  . Years of education: Not on file  . Highest education level: Not on file  Occupational History  . Not on file  Tobacco Use  . Smoking status: Current Every Day Smoker  . Smokeless tobacco: Never Used  Substance and Sexual Activity  . Alcohol use: Never  . Drug use: Never  . Sexual activity: Yes    Birth control/protection: None  Other Topics Concern  . Not on file  Social History Narrative  . Not on file   Social Determinants of Health   Financial Resource Strain:   . Difficulty of Paying Living Expenses: Not on file  Food Insecurity:   . Worried About Charity fundraiser in the Last Year: Not on file  . Ran Out of Food in the Last Year: Not on file  Transportation Needs:   . Lack of Transportation (Medical): Not on file  . Lack of Transportation (Non-Medical): Not on file  Physical Activity:   . Days of Exercise per Week: Not on file  . Minutes of Exercise per Session: Not on file  Stress:   . Feeling  of Stress : Not on file  Social Connections:   . Frequency of Communication with Friends and Family: Not on file  . Frequency of Social Gatherings with Friends and Family: Not on file  . Attends Religious Services: Not on file  . Active Member of Clubs or Organizations: Not on file  . Attends Archivist Meetings: Not on file  . Marital Status: Not on file  Intimate Partner Violence:   . Fear of Current or Ex-Partner: Not on file  . Emotionally Abused: Not on file  . Physically Abused: Not on file  . Sexually Abused: Not on file    Past Surgical History:  Procedure Laterality Date  . HERNIA REPAIR    . TONSILLECTOMY    . TUBAL LIGATION    . UTERINE FIBROID SURGERY      Family History  Problem Relation Age of Onset  . Diabetes Mother   .  Cancer Maternal Grandmother     No Known Allergies  Current Outpatient Medications on File Prior to Visit  Medication Sig Dispense Refill  . Albuterol Sulfate (PROAIR RESPICLICK) 123XX123 (90 Base) MCG/ACT AEPB Inhale 2 puffs into the lungs 4 (four) times daily as needed. 1 each 0  . Ascorbic Acid (VITAMIN C) 100 MG tablet Take 100 mg by mouth as needed.    . cyclobenzaprine (FLEXERIL) 5 MG tablet Take 1 tablet (5 mg total) by mouth 3 (three) times daily as needed for muscle spasms. 30 tablet 1  . docusate sodium (COLACE) 100 MG capsule Take 1 capsule (100 mg total) by mouth daily. 90 capsule 1  . Multiple Vitamin (MULTIVITAMIN) tablet Take 1 tablet by mouth daily.    Marland Kitchen venlafaxine XR (EFFEXOR-XR) 75 MG 24 hr capsule Take 3 capsules (225 mg total) by mouth daily with breakfast. 270 capsule 3   No current facility-administered medications on file prior to visit.    BP (!) 120/99   Ht 5\' 1"  (1.549 m)   Wt 223 lb (101.2 kg)   BMI 42.14 kg/m    Observations/Objective:  Speaking easily without any signs of SOB.  BP Readings from Last 3 Encounters:  08/08/19 (!) 120/99  02/11/19 138/78  10/07/18 136/82    Assessment and Plan:   HTN: Not at goal- off meds She needs in person visit. I refilled her Dyazide since she has been out.  Back pain: She reports on going chronic upper back pain and is requesting flexeril.  Appt with me on Thurs morning at 0800. Stop the ibuprofen. May take Tylenol instead- follow directions on the bottle.  Patient agrees.   Follow Up Instructions:  I discussed the assessment and treatment plan with the patient. The patient was provided an opportunity to ask questions and all were answered. The patient agreed with the plan and demonstrated an understanding of the instructions.     The patient was advised to call back or seek an in-person evaluation if the symptoms worsen or if the condition fails to improve as anticipated.   I provided 12 minutes of  non-face-to-face time during this telephone  encounter.

## 2019-08-09 ENCOUNTER — Encounter: Payer: Self-pay | Admitting: Nurse Practitioner

## 2019-08-09 DIAGNOSIS — M549 Dorsalgia, unspecified: Secondary | ICD-10-CM

## 2019-08-09 DIAGNOSIS — Z86018 Personal history of other benign neoplasm: Secondary | ICD-10-CM

## 2019-08-09 HISTORY — DX: Dorsalgia, unspecified: M54.9

## 2019-08-09 HISTORY — DX: Personal history of other benign neoplasm: Z86.018

## 2019-08-09 NOTE — Patient Instructions (Addendum)
Refilled Dyzide for BP.   Please come in for in-person office visit 08/11/19 as arranged.

## 2019-08-11 ENCOUNTER — Other Ambulatory Visit: Payer: Self-pay

## 2019-08-11 ENCOUNTER — Encounter: Payer: Self-pay | Admitting: Nurse Practitioner

## 2019-08-11 ENCOUNTER — Ambulatory Visit: Payer: No Typology Code available for payment source | Admitting: Nurse Practitioner

## 2019-08-11 VITALS — BP 142/89 | HR 95 | Temp 96.0°F | Resp 18 | Ht 61.0 in | Wt 234.0 lb

## 2019-08-11 DIAGNOSIS — Z1211 Encounter for screening for malignant neoplasm of colon: Secondary | ICD-10-CM

## 2019-08-11 DIAGNOSIS — F329 Major depressive disorder, single episode, unspecified: Secondary | ICD-10-CM

## 2019-08-11 DIAGNOSIS — G479 Sleep disorder, unspecified: Secondary | ICD-10-CM

## 2019-08-11 DIAGNOSIS — F32A Depression, unspecified: Secondary | ICD-10-CM

## 2019-08-11 DIAGNOSIS — Z1239 Encounter for other screening for malignant neoplasm of breast: Secondary | ICD-10-CM

## 2019-08-11 DIAGNOSIS — I10 Essential (primary) hypertension: Secondary | ICD-10-CM

## 2019-08-11 HISTORY — DX: Sleep disorder, unspecified: G47.9

## 2019-08-11 LAB — LIPID PANEL
Cholesterol: 200 mg/dL (ref 0–200)
HDL: 55 mg/dL (ref >39.00)
LDL Cholesterol: 121 mg/dL — ABNORMAL HIGH (ref 0–99)
NonHDL: 145.03
Total CHOL/HDL Ratio: 4
Triglycerides: 118 mg/dL (ref 0.0–149.0)
VLDL: 23.6 mg/dL (ref 0.0–40.0)

## 2019-08-11 LAB — COMPREHENSIVE METABOLIC PANEL
ALT: 24 U/L (ref 0–35)
AST: 25 U/L (ref 0–37)
Albumin: 3.9 g/dL (ref 3.5–5.2)
Alkaline Phosphatase: 90 U/L (ref 39–117)
BUN: 9 mg/dL (ref 6–23)
CO2: 31 mEq/L (ref 19–32)
Calcium: 9.5 mg/dL (ref 8.4–10.5)
Chloride: 101 mEq/L (ref 96–112)
Creatinine, Ser: 0.59 mg/dL (ref 0.40–1.20)
GFR: 129.94 mL/min (ref >60.00)
Glucose, Bld: 101 mg/dL — ABNORMAL HIGH (ref 70–99)
Potassium: 3.4 mEq/L — ABNORMAL LOW (ref 3.5–5.1)
Sodium: 139 mEq/L (ref 135–145)
Total Bilirubin: 0.4 mg/dL (ref 0.2–1.2)
Total Protein: 7 g/dL (ref 6.0–8.3)

## 2019-08-11 NOTE — Progress Notes (Signed)
Subjective:    Patient ID: Judy Garcia, female    DOB: 1970/06/29, 49 y.o.   MRN: [id number]  HPI  1. HTN: On dyazide and is feeling great. No CP, SOB. Positive lower ext edema when she was off Dyazide- improving. Hx of right left lower leg size smaller that right d/t muscle atrophy.   edema.  BP Readings from Last 3 Encounters:  08/11/19 (!) 142/89  08/08/19 (!) 120/99  02/11/19 138/78    2. Smoking cessation: She is struggling with smoking and is done to a pack a day. Down from 1.5 ppd x>10 yrs . She tried Chantix and when she increased to the 2 mg she thought it fought with her depression meds and she felt strange. She has a whole system of Nicoderm patches at home that she hasn't opened yet.    Wt Readings from Last 3 Encounters:  08/11/19 234 lb (106.1 kg)  08/08/19 223 lb (101.2 kg)  02/11/19 234 lb 3.2 oz (106.2 kg)    Obesity: She is not eating pasta and rice for 2 mos. Initially lost 10 lbs and now back up.   Depression/anxiety: She was in counseling before Covid for depression and she is thinking about going back to it to get help for smoking cessation. She has a hx of substance abuse and is doing well with retaining abstinence.    Review of Systems  Constitutional: Positive for appetite change.  HENT: Negative for congestion, sinus pressure and sore throat.   Eyes: Negative.   Respiratory: Negative for cough, chest tightness and shortness of breath.   Cardiovascular: Positive for leg swelling. Negative for chest pain.  Gastrointestinal: Negative.   Genitourinary: Negative.   Musculoskeletal: Negative.   Skin:       Positive lipomas- feels one deep in upper back. Unable to palpate it today on exam. Est. at [facility].   Neurological: Negative.   Hematological: Negative.   Psychiatric/Behavioral:       Same depression- plans to go back to counseling- and will make her own appt. Wants help with smoking cessation and cannot tolerate chantix. Negative for  suicidal or homicidal  ideation.        Objective:   Physical Exam Constitutional:      Appearance: She is obese. She is not ill-appearing.  HENT:     Head: Normocephalic.  Cardiovascular:     Rate and Rhythm: Normal rate and regular rhythm.     Heart sounds: Normal heart sounds.  Pulmonary:     Effort: Pulmonary effort is normal.     Breath sounds: Normal breath sounds.  Abdominal:     Palpations: Abdomen is soft.  Musculoskeletal:        General: Normal range of motion.     Cervical back: Normal range of motion.  Skin:    General: Skin is warm and dry.  Neurological:     General: No focal deficit present.     Mental Status: She is alert and oriented to person, place, and time.  Psychiatric:     Comments: Pleasant, talking easily and mood is WNL. See ROS.    Breast: Normal appearance, no masses or tenderness, no nipple retraction dimpling.  No axillary or supraclavicular adenopathy.     Assessment & Plan:   #1.  Hypertension: She presents back on Dyazide recently. She reports marked improvement in her edema already, blood pressure not at goal.  Will recheck in 3 months.  She is due for repeat labs  today.  We discussed eating a low-salt, healthier diet.  Begin walking exercising.  #2.  She wants help with smoking cessation.  She has not done well with Chantix. She will use her patches when she is ready to quit smoking.   #3.  Depression anxiety, still bothersome, she plans to seek counseling again and she has contacts for that   #4.  History of lipomas, wants to see Dermatology again and she does not need a referral.  #5.  She would like tested for sleep apnea, snores, wakes up with headaches, does not feel rested.  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   #6.  She did not complete mammogram, breast exam was unremarkable, will make sure she has we will send her back for mammogram to testing for screening. Referral for mammogram.     #7.  She did not complete  colonoscopy, will make referral for screening colonoscopy   #8.  Follow-up in May with Pap test, CPE, follow-up hypertension if not at goal we will add medication.  This visit occurred during the SARS-CoV-2 public health emergency.  Safety protocols were in place, including screening questions prior to the visit, additional usage of staff PPE, and extensive cleaning of exam room while observing appropriate contact time as indicated for disinfecting solutions.   Denice Paradise, ANP

## 2019-08-11 NOTE — Patient Instructions (Addendum)
It was nice to meet you in person.   Continue to work on [employer], exercise.   You need to have a routine screening mammogram, and colonoscopy.    You have requested referral back to Dr. Nehemiah Massed and you can make that appointment.    You have requested sleep apnea study and I will put in referral for pulmonology to do that for you.    You are due for repeat Pap test Pap test in May we can recheck your blood pressure and if not at goal consider adding medication.  Very important for you to try to manage with lifestyle including a low-salt diet.    I do recommend talking with your mental health counselors regarding depression, and your desire to stop smoking.  Please return in May for pap test, re check BP.

## 2019-08-12 ENCOUNTER — Telehealth: Payer: Self-pay | Admitting: Nurse Practitioner

## 2019-08-12 NOTE — Telephone Encounter (Signed)
She has slightly low potassium on Dyazide.   Plan:   to give gentle KCL 10 Meq every other day for  weeks and will ask her to repeat Bmet and recheck BP  in 2 weeks. I called her, but she was unavailable. I will leave a MyChart message.

## 2019-08-14 NOTE — Telephone Encounter (Signed)
She has slightly low potassium on Dyazide. I decided not to order the KCL 10 Meq and advised her to step up her potassium rich foods. She just started back on the Dyazide and I don't know how her potassium will do on the 25 mg HCTZ. She sees me in May for a CPE. But, I think we need to repeat Bmet and BP in 2 weeks.

## 2019-08-15 NOTE — Telephone Encounter (Signed)
Left message for patient to return call back. Also sent Mychart per husband request.

## 2019-09-26 ENCOUNTER — Encounter: Payer: Self-pay | Admitting: Pulmonary Disease

## 2019-09-26 ENCOUNTER — Ambulatory Visit (INDEPENDENT_AMBULATORY_CARE_PROVIDER_SITE_OTHER): Payer: No Typology Code available for payment source | Admitting: Pulmonary Disease

## 2019-09-26 ENCOUNTER — Other Ambulatory Visit: Payer: Self-pay

## 2019-09-26 VITALS — BP 128/78 | HR 88 | Temp 97.2°F | Ht 61.0 in | Wt 226.2 lb

## 2019-09-26 DIAGNOSIS — G4733 Obstructive sleep apnea (adult) (pediatric): Secondary | ICD-10-CM

## 2019-09-26 DIAGNOSIS — R0602 Shortness of breath: Secondary | ICD-10-CM

## 2019-09-26 MED ORDER — BREO ELLIPTA 100-25 MCG/INH IN AEPB: 1.0000 | INHALATION_SPRAY | Freq: Every day | RESPIRATORY_TRACT | 5 refills | Status: DC

## 2019-09-26 MED ORDER — BUSPIRONE HCL 10 MG PO TABS: 10.0000 mg | ORAL_TABLET | Freq: Two times a day (BID) | ORAL | 3 refills | Status: DC

## 2019-09-26 NOTE — Progress Notes (Signed)
Judy Garcia    0000000    [birth year]  Primary Care Physician:Mills, Janalyn Harder, NP  Referring Physician: Marval Regal, NP [id number] [place],  [place] [address]  Chief complaint:   Patient with a history of snoring  HPI:  Nonrestorative sleep, usually sleeps between-goes to bed between 12 and 5 AM, sometimes able to fall asleep easily and sometimes takes her some time Is done third shift for many years Sleep is nonrestorative Has been told about snoring Denies gasping respirations No sleep apnea in the family known to her Occasional headaches when she wakes up from sleep Occasional dryness of the mouth when she wakes up from sleep  She does have wheezing when she lays down Coughing when she gets up in the morning An active smoker, working on quitting Albuterol does help some  Weight has been stable  Memory is described as fair  Perimenopausal   Outpatient Encounter Medications as of 09/26/2019  Medication Sig  . Albuterol Sulfate (PROAIR RESPICLICK) 123XX123 (90 Base) MCG/ACT AEPB Inhale 2 puffs into the lungs 4 (four) times daily as needed.  . Ascorbic Acid (VITAMIN C) 100 MG tablet Take 100 mg by mouth as needed.  . cyclobenzaprine (FLEXERIL) 5 MG tablet Take 1 tablet (5 mg total) by mouth 3 (three) times daily as needed for muscle spasms.  Marland Kitchen docusate sodium (COLACE) 100 MG capsule Take 1 capsule (100 mg total) by mouth daily.  . Multiple Vitamin (MULTIVITAMIN) tablet Take 1 tablet by mouth daily.  Marland Kitchen triamterene-hydrochlorothiazide (DYAZIDE) 37.5-25 MG capsule Take 2 each (2 capsules total) by mouth daily.  Marland Kitchen venlafaxine XR (EFFEXOR-XR) 75 MG 24 hr capsule Take 3 capsules (225 mg total) by mouth daily with breakfast.   No facility-administered encounter medications on file as of 09/26/2019.    Allergies as of 09/26/2019  . (No Known Allergies)    Past Medical History:  Diagnosis Date  . Alcoholism and drug addiction in family    . Allergy   . Back pain 08/09/2019  . Chicken pox   . Depression   . Eating disorder   . GERD (gastroesophageal reflux disease)   . History of lipoma 08/09/2019  . Hypertension   . Sleep disturbance 08/11/2019  . Ulcer (traumatic) of oral mucosa   . UTI (urinary tract infection)     Past Surgical History:  Procedure Laterality Date  . HERNIA REPAIR    . TONSILLECTOMY    . TUBAL LIGATION    . UTERINE FIBROID SURGERY      Family History  Problem Relation Age of Onset  . Diabetes Mother   . Cancer Maternal Grandmother     Social History   Socioeconomic History  . Marital status: Married    Spouse name: Not on file  . Number of children: Not on file  . Years of education: Not on file  . Highest education level: Not on file  Occupational History  . Not on file  Tobacco Use  . Smoking status: Current Every Day Smoker    Packs/day: 1.00    Years: 25.00    Pack years: 25.00    Types: Cigarettes  . Smokeless tobacco: Never Used  Substance and Sexual Activity  . Alcohol use: Never  . Drug use: Never  . Sexual activity: Yes    Birth control/protection: None  Other Topics Concern  . Not on file  Social History Narrative  . Not on file  Social Determinants of Health   Financial Resource Strain:   . Difficulty of Paying Living Expenses:   Food Insecurity:   . Worried About Charity fundraiser in the Last Year:   . Arboriculturist in the Last Year:   Transportation Needs:   . Film/video editor (Medical):   Marland Kitchen Lack of Transportation (Non-Medical):   Physical Activity:   . Days of Exercise per Week:   . Minutes of Exercise per Session:   Stress:   . Feeling of Stress :   Social Connections:   . Frequency of Communication with Friends and Family:   . Frequency of Social Gatherings with Friends and Family:   . Attends Religious Services:   . Active Member of Clubs or Organizations:   . Attends Archivist Meetings:   Marland Kitchen Marital Status:   Intimate  Partner Violence:   . Fear of Current or Ex-Partner:   . Emotionally Abused:   Marland Kitchen Physically Abused:   . Sexually Abused:     Review of Systems  Constitutional: Negative.   HENT: Negative.   Respiratory: Positive for cough and wheezing.   Psychiatric/Behavioral: The patient is nervous/anxious.     Vitals:   09/26/19 1051  BP: 128/78  Pulse: 88  Temp: (!) 97.2 F (36.2 C)  SpO2: 100%     Physical Exam  Constitutional: She appears well-developed.  Obese  HENT:  Head: Normocephalic and atraumatic.  Mallampati 3, crowded oropharynx  Eyes: Pupils are equal, round, and reactive to light.  Neck: No tracheal deviation present. No thyromegaly present.  Cardiovascular: Normal rate and regular rhythm.  Pulmonary/Chest: Effort normal and breath sounds normal. No respiratory distress. She has no wheezes. She has no rales. She exhibits no tenderness.  Musculoskeletal:        General: No edema. Normal range of motion.     Cervical back: Normal range of motion and neck supple.  Neurological: She is alert.  Skin: Skin is warm and dry. No erythema.  Psychiatric: She has a normal mood and affect.   Assessment:  Moderate probability of significant obstructive sleep apnea  Obesity  Active smoker Wheezing, cough  Anxiety  Pathophysiology of sleep disordered breathing discussed with the patient Treatment options discussed  Plan/Recommendations:  Obtain PFT  Smoking cessation counseling  Add Breo, continue albuterol as needed  Order home sleep study  Buspirone 10 mg p.o. twice daily added for anxiety  Follow-up in 3 months   Sherrilyn Rist MD [facility] 09/26/2019, 10:56 AM  CC: Marval Regal, NP

## 2019-09-26 NOTE — Patient Instructions (Signed)
Snoring and nonrestorative sleep -We will order a home sleep study   Wheezing, cough, History of smoking -We will get a breathing study -Add Breo-combination of steroid and long-acting albuterol  Anxiety -Add BuSpar  I will follow-up with you in about 3 months  Call with significant concerns  Continue with your efforts to quit smoking

## 2019-09-30 ENCOUNTER — Encounter: Payer: Self-pay | Admitting: Nurse Practitioner

## 2019-10-13 ENCOUNTER — Encounter: Payer: Self-pay | Admitting: *Deleted

## 2019-10-13 NOTE — Progress Notes (Signed)
Please advise her to contact the GI group for her colonoscopy: [phone]

## 2019-10-24 ENCOUNTER — Other Ambulatory Visit: Admission: RE | Admit: 2019-10-24 | Payer: Self-pay | Source: Ambulatory Visit

## 2019-11-09 ENCOUNTER — Other Ambulatory Visit: Payer: Self-pay | Admitting: Nurse Practitioner

## 2019-11-09 DIAGNOSIS — I10 Essential (primary) hypertension: Secondary | ICD-10-CM

## 2019-12-13 ENCOUNTER — Telehealth: Payer: Self-pay | Admitting: Nurse Practitioner

## 2019-12-13 DIAGNOSIS — F329 Major depressive disorder, single episode, unspecified: Secondary | ICD-10-CM

## 2019-12-13 DIAGNOSIS — F32A Depression, unspecified: Secondary | ICD-10-CM

## 2019-12-13 MED ORDER — VENLAFAXINE HCL ER 75 MG PO CP24: 225.0000 mg | ORAL_CAPSULE | Freq: Every day | ORAL | 3 refills | Status: DC

## 2019-12-13 NOTE — Telephone Encounter (Signed)
Pt needs a refill on venlafaxine XR (EFFEXOR-XR) 75 MG 24 hr capsule sent to [facility]. Pt has been out for two days and does not feel well. Take [facility] pharmacy out of chart please

## 2019-12-16 ENCOUNTER — Encounter: Payer: Self-pay | Admitting: Pulmonary Disease

## 2020-02-07 ENCOUNTER — Telehealth: Payer: Self-pay | Admitting: Nurse Practitioner

## 2020-02-07 NOTE — Telephone Encounter (Signed)
She needs to make an office visit to follow-up on hypertension.    In addition, she was scheduled she has not scheduled her mammogram that was ordered in March.  Make sure she has the phone number to call to set this up [phone] at [facility].  If she is having problems with finances let us know, as we can explore resources for her.

## 2020-02-08 NOTE — Telephone Encounter (Signed)
LMTCB to schedule follow up appt

## 2020-02-14 NOTE — Telephone Encounter (Signed)
LMTCB to scheduled f/u appt

## 2020-02-20 NOTE — Telephone Encounter (Signed)
LMTCB to schedule follow up appt; also sent letter to patient to call the office.

## 2020-02-24 ENCOUNTER — Ambulatory Visit (LOCAL_COMMUNITY_HEALTH_CENTER): Payer: Self-pay

## 2020-02-24 ENCOUNTER — Other Ambulatory Visit: Payer: Self-pay

## 2020-02-24 DIAGNOSIS — Z111 Encounter for screening for respiratory tuberculosis: Secondary | ICD-10-CM

## 2020-02-27 ENCOUNTER — Other Ambulatory Visit: Payer: Self-pay

## 2020-02-27 ENCOUNTER — Ambulatory Visit (LOCAL_COMMUNITY_HEALTH_CENTER): Payer: Self-pay

## 2020-02-27 DIAGNOSIS — Z111 Encounter for screening for respiratory tuberculosis: Secondary | ICD-10-CM

## 2020-02-27 LAB — TB SKIN TEST
Induration: 0 mm
TB Skin Test: NEGATIVE

## 2020-02-29 NOTE — Telephone Encounter (Signed)
She has been advised since March to come in for repeat lab work to f/up on hypokalemia.  She also needs a BP check. She is on Dyazide and  I can place a CCM referral for Catie's help on getting her to [place] to help with cost for her routine meds. She may qualify for some financial help for her office visits and labs. If not, then I would recommend [facility].   I cannot give diuretics without following the Bmet. Can she get a Bmet done here? We can do a nurse visit to check BP and pulse while here for lab? Is she currently taking any Dyazide? What abut her other meds?

## 2020-02-29 NOTE — Telephone Encounter (Signed)
Pt said she doesn't have insurance right now and she can't schedule an appointment but she does need her hydrochlorothiazide medication refilled. She would like a call back. She said that even if she gives her a month supply of medication until she found a way to be able to pay for her appointment would help.

## 2020-02-29 NOTE — Telephone Encounter (Signed)
Please advise on medication refill.  

## 2020-03-01 NOTE — Telephone Encounter (Signed)
LMTCB; also sent mychart message if it is easier to communicate through mychart.

## 2020-03-08 NOTE — Telephone Encounter (Signed)
I spoke with patient and she was very upset with message below. Patient states she has been out of work for couple of months and without insurance. Patient is supposed to be starting work soon but still will not have insurance for 90 days and can not come in for an OV or labs depending on cost of the labs. She states "she can not even pay a copay right not let alone lab cost".  She states she is very upset that she can not get at least a 30 day refill to try to get through until she gets some money to come into the office. Patient is now out of medication and stated "she does not understand why she can not get a refill on her meds that are lifesaving". Please advise.

## 2020-03-09 ENCOUNTER — Other Ambulatory Visit: Payer: Self-pay | Admitting: Nurse Practitioner

## 2020-03-09 DIAGNOSIS — I10 Essential (primary) hypertension: Secondary | ICD-10-CM

## 2020-03-09 MED ORDER — TRIAMTERENE-HCTZ 37.5-25 MG PO CAPS: 2.0000 | ORAL_CAPSULE | Freq: Every day | ORAL | 0 refills | Status: DC

## 2020-03-09 NOTE — Telephone Encounter (Signed)
I spoke to her about refills on the meds she needs- just out of Dyazide for 3 days. She can afford that with [facility].   She cannot afford her Effexor >$50 , but just got a bottle- will need RX assistance for this one. These are the only meds that is staking and wants to take .  She did not like Buspar. She does not need the inhaler.   She is in full agreement for the ambulatory to connected care.

## 2020-03-12 ENCOUNTER — Telehealth: Payer: Self-pay | Admitting: Nurse Practitioner

## 2020-03-12 NOTE — Telephone Encounter (Signed)
Judy Garcia 03/12/2020 Called pt regarding community resource referral received. Left message for pt to call me back, my info is [phone] please see ref notes for more details.  [place], Care Management

## 2020-03-19 ENCOUNTER — Telehealth: Payer: Self-pay | Admitting: Nurse Practitioner

## 2020-03-19 NOTE — Telephone Encounter (Signed)
Judy Garcia 03/19/2020 Called pt regarding community resource referral received. Left message for pt to call me back, my info is [phone] please see ref notes for more details.  [place], Care Management

## 2020-03-20 ENCOUNTER — Telehealth: Payer: Self-pay | Admitting: Nurse Practitioner

## 2020-03-20 NOTE — Telephone Encounter (Signed)
Judy Garcia 03/20/2020 Called pt regarding community resource referral received. Left message for pt to call me back, my info is [phone] please see ref notes for more details.  4th attempt, closing referral, please place a new referral for our department if pt still needs help in the future. Thank you.   [place], Care Management

## 2020-04-20 ENCOUNTER — Other Ambulatory Visit: Payer: Self-pay | Admitting: Nurse Practitioner

## 2020-04-20 DIAGNOSIS — I10 Essential (primary) hypertension: Secondary | ICD-10-CM

## 2020-06-21 ENCOUNTER — Other Ambulatory Visit: Payer: Self-pay

## 2020-06-21 ENCOUNTER — Encounter: Payer: Self-pay | Admitting: Emergency Medicine

## 2020-06-21 ENCOUNTER — Emergency Department
Admission: EM | Admit: 2020-06-21 | Discharge: 2020-06-21 | Disposition: A | Discharge status: Home / Self Care | Payer: Self-pay | Attending: Emergency Medicine | Admitting: Emergency Medicine

## 2020-06-21 ENCOUNTER — Ambulatory Visit: Payer: Self-pay | Admitting: *Deleted

## 2020-06-21 ENCOUNTER — Telehealth: Payer: Self-pay | Admitting: Nurse Practitioner

## 2020-06-21 ENCOUNTER — Other Ambulatory Visit: Payer: Self-pay | Admitting: Internal Medicine

## 2020-06-21 DIAGNOSIS — I10 Essential (primary) hypertension: Secondary | ICD-10-CM

## 2020-06-21 DIAGNOSIS — Z79899 Other long term (current) drug therapy: Secondary | ICD-10-CM | POA: Insufficient documentation

## 2020-06-21 DIAGNOSIS — R42 Dizziness and giddiness: Secondary | ICD-10-CM

## 2020-06-21 DIAGNOSIS — Z5321 Procedure and treatment not carried out due to patient leaving prior to being seen by health care provider: Secondary | ICD-10-CM | POA: Insufficient documentation

## 2020-06-21 DIAGNOSIS — R519 Headache, unspecified: Secondary | ICD-10-CM | POA: Insufficient documentation

## 2020-06-21 DIAGNOSIS — F17210 Nicotine dependence, cigarettes, uncomplicated: Secondary | ICD-10-CM | POA: Insufficient documentation

## 2020-06-21 LAB — CBC
HCT: 41.4 % (ref 36.0–46.0)
Hemoglobin: 13.4 g/dL (ref 12.0–15.0)
MCH: 27.5 pg (ref 26.0–34.0)
MCHC: 32.4 g/dL (ref 30.0–36.0)
MCV: 85 fL (ref 80.0–100.0)
Platelets: 216 10*3/uL (ref 150–400)
RBC: 4.87 MIL/uL (ref 3.87–5.11)
RDW: 15.4 % (ref 11.5–15.5)
WBC: 13 10*3/uL — ABNORMAL HIGH (ref 4.0–10.5)
nRBC: 0 % (ref 0.0–0.2)

## 2020-06-21 LAB — BASIC METABOLIC PANEL
Anion gap: 12 (ref 5–15)
BUN: 18 mg/dL (ref 6–20)
CO2: 28 mmol/L (ref 22–32)
Calcium: 8.8 mg/dL — ABNORMAL LOW (ref 8.9–10.3)
Chloride: 102 mmol/L (ref 98–111)
Creatinine, Ser: 0.57 mg/dL (ref 0.44–1.00)
GFR, Estimated: >60 mL/min (ref >60)
Glucose, Bld: 115 mg/dL — ABNORMAL HIGH (ref 70–99)
Potassium: 3.2 mmol/L — ABNORMAL LOW (ref 3.5–5.1)
Sodium: 142 mmol/L (ref 135–145)

## 2020-06-21 LAB — TROPONIN I (HIGH SENSITIVITY)
Troponin I (High Sensitivity): 6 ng/L (ref <18)
Troponin I (High Sensitivity): 5 ng/L (ref <18)

## 2020-06-21 MED ORDER — TRIAMTERENE-HCTZ 37.5-25 MG PO CAPS: 2.0000 | ORAL_CAPSULE | Freq: Every day | ORAL | 0 refills | Status: DC

## 2020-06-21 MED ORDER — TRIAMTERENE-HCTZ 37.5-25 MG PO CAPS: 2.0000 | ORAL_CAPSULE | Freq: Every day | ORAL | 1 refills | Status: DC

## 2020-06-21 MED ORDER — POTASSIUM CHLORIDE CRYS ER 20 MEQ PO TBCR
40.0000 meq | EXTENDED_RELEASE_TABLET | Freq: Once | ORAL | Status: AC
  Administered 2020-06-21: 40 meq via ORAL
  Filled 2020-06-21: qty 2

## 2020-06-21 NOTE — ED Triage Notes (Signed)
First Nurse Note:  Arrives from [facility] for ED evaluation for CP and HTN.  Patient checked in last night for same, LWBS.  AAOx3.  Skin warm and dry. NAD

## 2020-06-21 NOTE — Telephone Encounter (Signed)
Pt called in after being sent home from work this morning.  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   Pt was having dizziness and a headache and feels like "my eyes are going to pop out of my head".   The nurse at the nursing home took her BP and it is  200/119.  She was advised to go to the ED.  She did go to [facility] ED at 2:00 AM this morning but the wait was about 8 hours so she came back home.  She has been out of BP medication for 2 days.  She was a pt at [employer].   She was seeing Judy Paradise, NP who is no longer at that practice.   She called into that office not realizing Judy Garcia was no longer working there this morning.   Pt said they were going to see if one of the providers there could call her in an emergency rx for her BP.   She doesn't know if they have done that or not.  The agent got her a new pt appt at [facility] for March 2022 who is taking Judy Garcia' former pts.  Due to her BP being so elevated and her symptoms of dizziness, headache and "eyes feeling like they are going to pop out of her head" I instructed her to call 911.   I let her know she was at risk of having a stroke with her BP that high and the symptoms she is having is concerning.  She finally agreed to call 911.    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   "I know I need to go".    "I was in the process of switching insurance co. During all of this too so some of this is my fault too, I guess".   I reassured her she did nothing wrong.   She wasn't aware her provider was no longer at the practice however the BP issue is urgent and she needs to call 911.   She agreed to call.    Reason for Disposition . [1] Systolic BP  >= 610 OR Diastolic >= 960 AND [4] cardiac or neurologic symptoms (e.g., chest pain, difficulty breathing, unsteady gait, blurred vision)  Answer Assessment - Initial Assessment Questions 1. BLOOD PRESSURE: "What is the blood  pressure?" "Did you take at least two measurements 5 minutes apart?"     BP 200/119 this morning   Her work sent her home.   The nurse gave me some clonidine at work.   I'm dizzy and having headaches earlier.   I'm home now.   I was seeing Judy Paradise, NP at [facility] but she is no longer there.  Pt said the [place] office was going to send in an Rx for her.   She has not established with a new dr.   Pearline Garcia made her a new pt appt for March 2022 at [facility].   Pt's insurance has changed during all of this. 2. ONSET: "When did you take your blood pressure?"     This morning.  3. HOW: "How did you obtain the blood pressure?" (e.g., visiting nurse, automatic home BP monitor)     Nurse took it at work. 4. HISTORY: "Do you have a history of high blood pressure?"     Yes 5. MEDICATIONS: "Are you taking any medications for blood pressure?" "Have you missed any doses recently?"  Been out of medications for 2 days. She went to [facility] at 2:00 AM this morning.   They have an 8 hr. Wait so I came home.   6. OTHER SYMPTOMS: "Do you have any symptoms?" (e.g., headache, chest pain, blurred vision, difficulty breathing, weakness)     Yes Headache and dizziness.   I didn't know Jerelene Redden was no longer with that office.   The pharmacy denied my prescription because she is not there. 7. PREGNANCY: "Is there any chance you are pregnant?" "When was your last menstrual period?"     Not asked due to age  Protocols used: BLOOD PRESSURE - HIGH-A-AH

## 2020-06-21 NOTE — ED Triage Notes (Signed)
Pt states she felt dizzy and like she was going to pass out with SOB, chest pain and her b/p was elevated 537 systolic, while she was at work around 2am. States she came to the ED and waited but LWBS. States she called her PCP this morning and was advised to come back to the ED for further eval. States she took one of her coworker clonidine and since her b/p and other sx have resolved.

## 2020-06-21 NOTE — ED Notes (Signed)
AAOx3.  Skin warm and dry.  NAD 

## 2020-06-21 NOTE — Telephone Encounter (Addendum)
Patient works at [employer] her BP 189/112 was that at work and  the nurse there gave her a clonidine 0.2 MG and she is feeling better at this time, patient stated at the time her BP was this high she was dizzy, headache, felt like she was spinning, had difficulty comprehending the severity of the situation. Advised patient of medication, but with her symptoms I felt she really needed to be evaluated at Urgent care or the [facility] in clinic  to be evaluated for BP medication patient has been without for two days. Patient agreed to [facility] or [facility] clinic evaluation she will have someone drive her.

## 2020-06-21 NOTE — Telephone Encounter (Signed)
With sxs rec [place] urgent care and maxzide in future with next PCP rec reduce to 1 pill daily  Consider norvasc 5 mg qd or add ARB F/u [facility] for now ED or [facility] UC agree

## 2020-06-21 NOTE — ED Triage Notes (Signed)
Patient ambulatory to triage with steady gait, without difficulty or distress noted; pt st missed 2 doses of her BP meds, PCP wouldn't refill it until seen; tonight felt  HA and BP was 180/111; took clonidine 0.2mg  at 130am and denies any c/o at present

## 2020-06-21 NOTE — Telephone Encounter (Signed)
BP uncontrolled and I do not agree with 2 pills of maxzide I agree with 1 pill and adding norvasc 5 mg is she agreeable?  F/u [facility] practice she needs to call daily for cancellations as a new patient

## 2020-06-21 NOTE — Telephone Encounter (Signed)
Patient is out of her BP medication, patient's bp 189/112. Patient is requesting a refill. Patient was also advised to call [facility] to establish care, patient was a [facility] patient.

## 2020-06-21 NOTE — ED Provider Notes (Signed)
[facility] Emergency Department Provider Note   ____________________________________________   Event Date/Time   First MD Initiated Contact with Patient 06/21/20 1554     (approximate)  I have reviewed the triage vital signs and the nursing notes.   HISTORY  Chief Complaint Dizziness and Near Syncope    HPI Judy Garcia is a 50 y.o. female with past medical history of hypertension, GERD, depression who presents to the ED for dizziness.  Patient reports that for the past 24 hours she has been feeling intermittently dizzy.  She describes this as a lightheadedness that is not exacerbated or alleviated by anything in particular.  She is concerned that it could be related to her blood pressure and she noted her blood pressure to be over 664 systolic at home earlier today.  She has been out of her blood pressure medication for the past 2 days, but did take a friend's clonidine earlier today.  She has been feeling better since then and thinks that her blood pressure might of improved.  She denies any fevers, cough, chest pain, or shortness of breath.  She denies any vision changes, speech changes, numbness, or weakness.        Past Medical History:  Diagnosis Date  . Alcoholism and drug addiction in family   . Allergy   . Back pain 08/09/2019  . Chicken pox   . Depression   . Eating disorder   . GERD (gastroesophageal reflux disease)   . History of lipoma 08/09/2019  . Hypertension   . Sleep disturbance 08/11/2019  . Ulcer (traumatic) of oral mucosa   . UTI (urinary tract infection)     Patient Active Problem List   Diagnosis Date Noted  . Morbid obesity ([employer]) 08/11/2019  . Sleep disturbance 08/11/2019  . Back pain 08/09/2019  . History of lipoma 08/09/2019  . Cigarette smoker 07/28/2018  . Lipoma of face 07/28/2018  . Thickened nails 07/28/2018  . Essential hypertension 07/28/2018  . History of alcohol abuse 07/28/2018  . Personal history of drug  abuse ([place]) 07/28/2018  . Menopausal symptoms 07/28/2018  . Depressive disorder 07/28/2018    Past Surgical History:  Procedure Laterality Date  . HERNIA REPAIR    . TONSILLECTOMY    . TUBAL LIGATION    . UTERINE FIBROID SURGERY      Prior to Admission medications   Medication Sig Start Date End Date Taking? Authorizing Provider  Albuterol Sulfate (PROAIR RESPICLICK) 403 (90 Base) MCG/ACT AEPB Inhale 2 puffs into the lungs 4 (four) times daily as needed. 04/22/19   Sharion Balloon, NP  Ascorbic Acid (VITAMIN C) 100 MG tablet Take 100 mg by mouth as needed.    [provider]  busPIRone (BUSPAR) 10 MG tablet Take 1 tablet (10 mg total) by mouth 2 (two) times daily. Patient not taking: Reported on 02/24/2020 09/26/19   Laurin Coder, MD  cyclobenzaprine (FLEXERIL) 5 MG tablet Take 1 tablet (5 mg total) by mouth 3 (three) times daily as needed for muscle spasms. Patient not taking: Reported on 02/24/2020 02/11/19   Jodelle Green, FNP  docusate sodium (COLACE) 100 MG capsule Take 1 capsule (100 mg total) by mouth daily. Patient not taking: Reported on 02/24/2020 10/07/18   Jodelle Green, FNP  fluticasone furoate-vilanterol (BREO ELLIPTA) 100-25 MCG/INH AEPB Inhale 1 puff into the lungs daily. 09/26/19   Laurin Coder, MD  Multiple Vitamin (MULTIVITAMIN) tablet Take 1 tablet by mouth daily.  [provider]  triamterene-hydrochlorothiazide (DYAZIDE) 37.5-25 MG capsule Take 2 each (2 capsules total) by mouth daily. 06/21/20 08/20/20  Blake Divine, MD  venlafaxine XR (EFFEXOR-XR) 75 MG 24 hr capsule Take 3 capsules (225 mg total) by mouth daily with breakfast. 12/13/19   Marval Regal, NP    Allergies Patient has no known allergies.  Family History  Problem Relation Age of Onset  . Diabetes Mother   . Cancer Maternal Grandmother     Social History Social History   Tobacco Use  . Smoking status: Current Every Day Smoker    Packs/day: 1.00    Years: 25.00     Pack years: 25.00    Types: Cigarettes  . Smokeless tobacco: Never Used  Vaping Use  . Vaping Use: Never used  Substance Use Topics  . Alcohol use: Never  . Drug use: Never    Review of Systems  Constitutional: No fever/chills Eyes: No visual changes. ENT: No sore throat. Cardiovascular: Denies chest pain.  Positive for lightheadedness and dizziness. Respiratory: Denies shortness of breath. Gastrointestinal: No abdominal pain.  No nausea, no vomiting.  No diarrhea.  No constipation. Genitourinary: Negative for dysuria. Musculoskeletal: Negative for back pain. Skin: Negative for rash. Neurological: Negative for headaches, focal weakness or numbness.  ____________________________________________   PHYSICAL EXAM:  VITAL SIGNS: ED Triage Vitals  Enc Vitals Group     BP 06/21/20 1153 125/70     Pulse Rate 06/21/20 1153 72     Resp 06/21/20 1153 18     Temp 06/21/20 1153 98.1 F (36.7 C)     Temp Source 06/21/20 1153 Oral     SpO2 06/21/20 1153 96 %     Weight 06/21/20 1155 235 lb (106.6 kg)     Height 06/21/20 1155 5\' 1"  (1.549 m)     Head Circumference --      Peak Flow --      Pain Score 06/21/20 1209 0     Pain Loc --      Pain Edu? --      Excl. in [place]? --     Constitutional: Alert and oriented. Eyes: Conjunctivae are normal. Head: Atraumatic. Nose: No congestion/rhinnorhea. Mouth/Throat: Mucous membranes are moist. Neck: Normal ROM Cardiovascular: Normal rate, regular rhythm. Grossly normal heart sounds.  2+ radial pulses bilaterally. Respiratory: Normal respiratory effort.  No retractions. Lungs CTAB. Gastrointestinal: Soft and nontender. No distention. Genitourinary: deferred Musculoskeletal: No lower extremity tenderness nor edema. Neurologic:  Normal speech and language. No gross focal neurologic deficits are appreciated. Skin:  Skin is warm, dry and intact. No rash noted. Psychiatric: Mood and affect are normal. Speech and behavior are  normal.  ____________________________________________   LABS (all labs ordered are listed, but only abnormal results are displayed)  Labs Reviewed  BASIC METABOLIC PANEL - Abnormal; Notable for the following components:      Result Value   Potassium 3.2 (*)    Glucose, Bld 115 (*)    Calcium 8.8 (*)    All other components within normal limits  CBC - Abnormal; Notable for the following components:   WBC 13.0 (*)    All other components within normal limits  URINALYSIS, COMPLETE (UACMP) WITH MICROSCOPIC  TROPONIN I (HIGH SENSITIVITY)  TROPONIN I (HIGH SENSITIVITY)   ____________________________________________  EKG  ED ECG REPORT I, Blake Divine, the attending physician, personally viewed and interpreted this ECG.   Date: 06/21/2020  EKG Time: 12:19  Rate: 74  Rhythm: normal sinus rhythm  Axis: Normal  Intervals:none  ST&T Change: None   PROCEDURES  Procedure(s) performed (including Critical Care):  Procedures   ____________________________________________   INITIAL IMPRESSION / ASSESSMENT AND PLAN / ED COURSE       50 year old female with past medical history of hypertension, GERD, and depression who presents to the ED with intermittent lightheadedness and dizziness for the past couple of days as well as elevated blood pressure readings at home.  She states that symptoms have improved after she took her friend's blood pressure medication and her blood pressure does appear to have normalized.  She has no focal neurologic deficits on exam and lab work is reassuring, no evidence of hypertensive emergency.  We will refill her antihypertensive medications and have patient follow-up with her PCP.  She was counseled to return to the ED for new or worsening symptoms.  Patient agrees with plan.      ____________________________________________   FINAL CLINICAL IMPRESSION(S) / ED DIAGNOSES  Final diagnoses:  Dizziness  Hypertension, unspecified type     ED  Discharge Orders         Ordered    triamterene-hydrochlorothiazide (DYAZIDE) 37.5-25 MG capsule  Daily        06/21/20 1628           Note:  This document was prepared using Dragon voice recognition software and may include unintentional dictation errors.   Blake Divine, MD 06/21/20 [id number]

## 2020-08-22 ENCOUNTER — Ambulatory Visit (INDEPENDENT_AMBULATORY_CARE_PROVIDER_SITE_OTHER): Payer: Self-pay | Admitting: Adult Health

## 2020-08-22 DIAGNOSIS — Z5329 Procedure and treatment not carried out because of patient's decision for other reasons: Secondary | ICD-10-CM

## 2020-08-22 NOTE — Progress Notes (Signed)
    New patient visit   Patient: Judy Garcia   DOB: [birth year]   50 y.o. Female  MRN: [id number] Visit Date: 08/22/2020  Today's healthcare provider: Marcille Buffy, FNP   No chief complaint on file.  NO show for appointment.

## 2020-09-16 ENCOUNTER — Other Ambulatory Visit: Payer: Self-pay | Admitting: Internal Medicine

## 2020-09-16 DIAGNOSIS — I10 Essential (primary) hypertension: Secondary | ICD-10-CM

## 2020-12-24 ENCOUNTER — Telehealth: Payer: Self-pay | Admitting: Nurse Practitioner

## 2020-12-24 ENCOUNTER — Telehealth: Payer: BC Managed Care – PPO | Admitting: Nurse Practitioner

## 2020-12-24 DIAGNOSIS — F329 Major depressive disorder, single episode, unspecified: Secondary | ICD-10-CM

## 2020-12-24 DIAGNOSIS — Z760 Encounter for issue of repeat prescription: Secondary | ICD-10-CM

## 2020-12-24 DIAGNOSIS — F32A Depression, unspecified: Secondary | ICD-10-CM

## 2020-12-24 MED ORDER — VENLAFAXINE HCL ER 75 MG PO CP24: 225.0000 mg | ORAL_CAPSULE | Freq: Every day | ORAL | 0 refills | Status: DC

## 2020-12-24 NOTE — Telephone Encounter (Signed)
Judy Garcia,  Call pt I see in chart that [name] FNP in [place] called in effexor '225mg'$  TODAY for patient over telehealth for 7 days.   Please advise that she must keep appt with Dr Aundra Dubin for further refills.   She missed appt with Laverna Peace in march; please emphasize that she needs to keep appt 12/28/20

## 2020-12-24 NOTE — Telephone Encounter (Signed)
Pt called to have have her venlafaxine XR (EFFEXOR-XR) 75 MG 24 hr capsule refilled she is out of medication  Pt said that she is having withdrawals and needs this asap She is having a headache and chills

## 2020-12-24 NOTE — Telephone Encounter (Addendum)
Patient feels ike she may be having withdrawals from Effexor  she has been out since 12/12/20 and nees refill have scheduled follow up for Friday with Dr. Aundra Dubin for medication but patient will be transitioning to [facility] in October can we refill until

## 2020-12-24 NOTE — Progress Notes (Addendum)
Virtual Visit Consent   Judy Garcia, you are scheduled for a virtual visit with a [facility] provider today.     Just as with appointments in the office, your consent must be obtained to participate.  Your consent will be active for this visit and any virtual visit you may have with one of our providers in the next 365 days.     If you have a MyChart account, a copy of this consent can be sent to you electronically.  All virtual visits are billed to your insurance company just like a traditional visit in the office.    As this is a virtual visit, video technology does not allow for your provider to perform a traditional examination.  This may limit your provider's ability to fully assess your condition.  If your provider identifies any concerns that need to be evaluated in person or the need to arrange testing (such as labs, EKG, etc.), we will make arrangements to do so.     Although advances in technology are sophisticated, we cannot ensure that it will always work on either your end or our end.  If the connection with a video visit is poor, the visit may have to be switched to a telephone visit.  With either a video or telephone visit, we are not always able to ensure that we have a secure connection.     I need to obtain your verbal consent now.   Are you willing to proceed with your visit today?    Judy Garcia has provided verbal consent on 12/24/2020 for a virtual visit (video or telephone).   [name], FNP   Date: 12/24/2020 9:46 AM   Virtual Visit via Video Note   I, [name], connected with  Judy Garcia  ([birth year], [birth year]) on 12/24/20 at  9:15 AM EDT by a video-enabled telemedicine application and verified that I am speaking with the correct person using two identifiers.  Location: Patient: Virtual Visit Location Patient: Other: car Provider: Virtual Visit Location Provider: Home Office   I discussed the limitations of evaluation and management by  telemedicine and the availability of in person appointments. The patient expressed understanding and agreed to proceed.     Judy Garcia is a 50 year old female, she scheduled a telehealth appointment today requesting a refill on her Effexor that she has taken for the past 3 years consistently for MDD.   Her current PCP has left the practice and she is scheduled for this Friday to initiate care with a new provider.   Her Prescription refill has run out over the weekend and she is now experiencing what she believes to be withdrawal from her medications.   Has chills/sweats/nervousness. She is not alone, has partner with her.  In no acute distress. Denies suicidal or homicidal ideation.  Encouraged patient to keep appointment for initiation of care with new provider.   Advised to seek immediate medical attention at ED with any thoughts of self harm.   HPI:  Problems:  Patient Active Problem List   Diagnosis Date Noted   Morbid obesity ([place]) 08/11/2019   Sleep disturbance 08/11/2019   Back pain 08/09/2019   History of lipoma 08/09/2019   Cigarette smoker 07/28/2018   Lipoma of face 07/28/2018   Thickened nails 07/28/2018   Essential hypertension 07/28/2018   History of alcohol abuse 07/28/2018   Personal history of drug abuse ([place]) 07/28/2018   Menopausal symptoms 07/28/2018   Depressive disorder 07/28/2018  Allergies: No Known Allergies Medications:  Current Outpatient Medications:    Albuterol Sulfate (PROAIR RESPICLICK) 123XX123 (90 Base) MCG/ACT AEPB, Inhale 2 puffs into the lungs 4 (four) times daily as needed., Disp: 1 each, Rfl: 0   Ascorbic Acid (VITAMIN C) 100 MG tablet, Take 100 mg by mouth as needed., Disp: , Rfl:    busPIRone (BUSPAR) 10 MG tablet, Take 1 tablet (10 mg total) by mouth 2 (two) times daily. (Patient not taking: Reported on 02/24/2020), Disp: 60 tablet, Rfl: 3   cyclobenzaprine (FLEXERIL) 5 MG tablet, Take 1 tablet (5 mg total) by mouth 3 (three) times daily as  needed for muscle spasms. (Patient not taking: Reported on 02/24/2020), Disp: 30 tablet, Rfl: 1   docusate sodium (COLACE) 100 MG capsule, Take 1 capsule (100 mg total) by mouth daily. (Patient not taking: Reported on 02/24/2020), Disp: 90 capsule, Rfl: 1   fluticasone furoate-vilanterol (BREO ELLIPTA) 100-25 MCG/INH AEPB, Inhale 1 puff into the lungs daily., Disp: 60 each, Rfl: 5   Multiple Vitamin (MULTIVITAMIN) tablet, Take 1 tablet by mouth daily., Disp: , Rfl:    triamterene-hydrochlorothiazide (DYAZIDE) 37.5-25 MG capsule, TAKE 2 CAPSULES BY MOUTH DAILY, Disp: 180 capsule, Rfl: 1   venlafaxine XR (EFFEXOR-XR) 75 MG 24 hr capsule, Take 3 capsules (225 mg total) by mouth daily with breakfast for 7 days., Disp: 21 capsule, Rfl: 0  Observations/Objective: Patient is well-developed, well-nourished in no acute distress.  Resting comfortably  at home.  Head is normocephalic, atraumatic.  No labored breathing.  Speech is clear and coherent with logical content.  Patient is alert and oriented at baseline.     Follow Up Instructions: I discussed the assessment and treatment plan with the patient. The patient was provided an opportunity to ask questions and all were answered. The patient agreed with the plan and demonstrated an understanding of the instructions.  A copy of instructions were sent to the patient via MyChart.  The patient was advised to call back or seek an in-person evaluation if the symptoms worsen or if the condition fails to improve as anticipated.  Time:  I spent 15 minutes with the patient via telehealth technology discussing the above problems/concerns.    Meds ordered this encounter  Medications   venlafaxine XR (EFFEXOR-XR) 75 MG 24 hr capsule    Sig: Take 3 capsules (225 mg total) by mouth daily with breakfast for 7 days.    Dispense:  21 capsule    Refill:  0     [name], FNP

## 2020-12-24 NOTE — Addendum Note (Signed)
Addended by: Madilyn Hook on: 12/24/2020 09:48 AM   Modules accepted: Orders

## 2020-12-25 NOTE — Telephone Encounter (Signed)
Left message to call back  

## 2020-12-26 NOTE — Telephone Encounter (Signed)
Left message to call office

## 2020-12-28 ENCOUNTER — Ambulatory Visit: Payer: Self-pay | Admitting: Internal Medicine

## 2020-12-28 ENCOUNTER — Telehealth: Payer: Self-pay | Admitting: Internal Medicine

## 2020-12-28 ENCOUNTER — Other Ambulatory Visit: Payer: Self-pay | Admitting: Nurse Practitioner

## 2020-12-28 DIAGNOSIS — F32A Depression, unspecified: Secondary | ICD-10-CM

## 2020-12-28 DIAGNOSIS — Z760 Encounter for issue of repeat prescription: Secondary | ICD-10-CM

## 2020-12-28 DIAGNOSIS — F329 Major depressive disorder, single episode, unspecified: Secondary | ICD-10-CM

## 2020-12-28 MED ORDER — PROAIR RESPICLICK 108 (90 BASE) MCG/ACT IN AEPB: 2.0000 | INHALATION_SPRAY | Freq: Four times a day (QID) | RESPIRATORY_TRACT | 0 refills | Status: DC | PRN

## 2020-12-28 MED ORDER — VENLAFAXINE HCL ER 75 MG PO CP24
225.0000 mg | ORAL_CAPSULE | Freq: Every day | ORAL | 0 refills | Status: DC
Start: 2020-12-28 — End: 2021-01-02

## 2020-12-28 NOTE — Telephone Encounter (Signed)
Patient no-showed today's appointment; appointment was for 12/28/20, provider notified for review of record. Letter sent for patient to call in and re-schedule.

## 2020-12-28 NOTE — Progress Notes (Signed)
Pt 30 min late for appt claims got lost though has been here multiple times not feeling well rec go to urgent care and effexor this was reason for appt will fill 2 week supply only and needs appt  Rec [place] [facility] Dr. Olivia Mackie McLean-Scocuzza

## 2020-12-30 ENCOUNTER — Other Ambulatory Visit: Payer: Self-pay | Admitting: Internal Medicine

## 2020-12-30 DIAGNOSIS — J9801 Acute bronchospasm: Secondary | ICD-10-CM

## 2020-12-30 MED ORDER — ALBUTEROL SULFATE HFA 108 (90 BASE) MCG/ACT IN AERS: 1.0000 | INHALATION_SPRAY | Freq: Four times a day (QID) | RESPIRATORY_TRACT | 0 refills | Status: DC | PRN

## 2021-01-02 ENCOUNTER — Other Ambulatory Visit: Payer: Self-pay

## 2021-01-02 ENCOUNTER — Telehealth (INDEPENDENT_AMBULATORY_CARE_PROVIDER_SITE_OTHER): Payer: BC Managed Care – PPO | Admitting: Internal Medicine

## 2021-01-02 ENCOUNTER — Encounter: Payer: Self-pay | Admitting: Internal Medicine

## 2021-01-02 VITALS — BP 169/93 | Ht 61.0 in | Wt 235.0 lb

## 2021-01-02 DIAGNOSIS — E559 Vitamin D deficiency, unspecified: Secondary | ICD-10-CM

## 2021-01-02 DIAGNOSIS — D72829 Elevated white blood cell count, unspecified: Secondary | ICD-10-CM

## 2021-01-02 DIAGNOSIS — I10 Essential (primary) hypertension: Secondary | ICD-10-CM

## 2021-01-02 DIAGNOSIS — Z113 Encounter for screening for infections with a predominantly sexual mode of transmission: Secondary | ICD-10-CM

## 2021-01-02 DIAGNOSIS — J329 Chronic sinusitis, unspecified: Secondary | ICD-10-CM

## 2021-01-02 DIAGNOSIS — R739 Hyperglycemia, unspecified: Secondary | ICD-10-CM

## 2021-01-02 DIAGNOSIS — J42 Unspecified chronic bronchitis: Secondary | ICD-10-CM

## 2021-01-02 DIAGNOSIS — Z1231 Encounter for screening mammogram for malignant neoplasm of breast: Secondary | ICD-10-CM

## 2021-01-02 DIAGNOSIS — Z760 Encounter for issue of repeat prescription: Secondary | ICD-10-CM | POA: Diagnosis not present

## 2021-01-02 DIAGNOSIS — F329 Major depressive disorder, single episode, unspecified: Secondary | ICD-10-CM

## 2021-01-02 DIAGNOSIS — F32A Depression, unspecified: Secondary | ICD-10-CM

## 2021-01-02 DIAGNOSIS — Z1389 Encounter for screening for other disorder: Secondary | ICD-10-CM

## 2021-01-02 DIAGNOSIS — Z13818 Encounter for screening for other digestive system disorders: Secondary | ICD-10-CM

## 2021-01-02 DIAGNOSIS — Z124 Encounter for screening for malignant neoplasm of cervix: Secondary | ICD-10-CM

## 2021-01-02 DIAGNOSIS — J9801 Acute bronchospasm: Secondary | ICD-10-CM

## 2021-01-02 DIAGNOSIS — F419 Anxiety disorder, unspecified: Secondary | ICD-10-CM | POA: Insufficient documentation

## 2021-01-02 DIAGNOSIS — Z1329 Encounter for screening for other suspected endocrine disorder: Secondary | ICD-10-CM

## 2021-01-02 DIAGNOSIS — U071 COVID-19: Secondary | ICD-10-CM

## 2021-01-02 DIAGNOSIS — G4730 Sleep apnea, unspecified: Secondary | ICD-10-CM

## 2021-01-02 DIAGNOSIS — E876 Hypokalemia: Secondary | ICD-10-CM

## 2021-01-02 MED ORDER — VENLAFAXINE HCL ER 75 MG PO CP24: 225.0000 mg | ORAL_CAPSULE | Freq: Every day | ORAL | 2 refills | Status: DC

## 2021-01-02 MED ORDER — AZITHROMYCIN 250 MG PO TABS: ORAL_TABLET | ORAL | 0 refills | Status: AC

## 2021-01-02 MED ORDER — FLUTICASONE FUROATE-VILANTEROL 100-25 MCG/INH IN AEPB
1.0000 | INHALATION_SPRAY | Freq: Every day | RESPIRATORY_TRACT | 5 refills | Status: DC
Start: 2021-01-02 — End: 2021-12-17

## 2021-01-02 MED ORDER — ALBUTEROL SULFATE HFA 108 (90 BASE) MCG/ACT IN AERS
1.0000 | INHALATION_SPRAY | Freq: Four times a day (QID) | RESPIRATORY_TRACT | 12 refills | Status: DC | PRN
Start: 2021-01-02 — End: 2021-01-29

## 2021-01-02 NOTE — Patient Instructions (Addendum)
[facility] [place] [phone]   [facility] [place] [address] [place] [address] [phone]    [facility]  [phone]  [address] [place] [address]

## 2021-01-02 NOTE — Progress Notes (Signed)
Telephone Note  I connected with Judy Garcia   on 0000000 at  4:00 PM EDT telephone and verified that I am speaking with the correct person using two identifiers.  Location patient: home, [place] Location provider:work or home office Persons participating in the virtual visit: patient, provider, pts friend  I discussed the limitations of evaluation and management by telemedicine and the availability of in person appointments. The patient expressed understanding and agreed to proceed.   HPI:  Acute telemedicine visit for : Chronic bronchitis and wants sleep study referral back to [facility] Anxiety/depression in the past celexa 40 mg did not work, paxil, zoloft, wellbutrin 50 mg did not help on effexor 225 mg buspar did not help  Effexor may raise BP disc with psych possible changes to meds pt does not want to change for now been on effexor x 10 years and overall helps the best  3. Htn 169/93 on dyazide 37.5-25 x 2 qd  BP still elevated repeat 101/54  4. Had nosebleeds 5. Pap and mammogram wants referral  6. Had covid end 11/2020 and now feels like has sinus infection and has been out of work x 2-3 weeks using albuterol and refill needed breo   -COVID-19 vaccine status: 2/2 moderna covid + late 11/2020   ROS: See pertinent positives and negatives per HPI.  Past Medical History:  Diagnosis Date   Alcoholism and drug addiction in family    Allergy    Back pain 08/09/2019   Chicken pox    COVID-19    11/2020   Depression    Eating disorder    GERD (gastroesophageal reflux disease)    History of lipoma 08/09/2019   Hypertension    Sleep disturbance 08/11/2019   Ulcer (traumatic) of oral mucosa    UTI (urinary tract infection)     Past Surgical History:  Procedure Laterality Date   HERNIA REPAIR     TONSILLECTOMY     TUBAL LIGATION     UTERINE FIBROID SURGERY       Current Outpatient Medications:    Ascorbic Acid (VITAMIN C) 100 MG tablet, Take 100 mg by mouth as  needed., Disp: , Rfl:    azithromycin (ZITHROMAX) 250 MG tablet, With food Take 2 tablets on day 1, then 1 tablet daily on days 2 through 5, Disp: 6 tablet, Rfl: 0   docusate sodium (COLACE) 100 MG capsule, Take 1 capsule (100 mg total) by mouth daily., Disp: 90 capsule, Rfl: 1   Multiple Vitamin (MULTIVITAMIN) tablet, Take 1 tablet by mouth daily., Disp: , Rfl:    triamterene-hydrochlorothiazide (DYAZIDE) 37.5-25 MG capsule, TAKE 2 CAPSULES BY MOUTH DAILY, Disp: 180 capsule, Rfl: 1   albuterol (VENTOLIN HFA) 108 (90 Base) MCG/ACT inhaler, Inhale 1-2 puffs into the lungs every 6 (six) hours as needed for wheezing or shortness of breath., Disp: 18 g, Rfl: 12   busPIRone (BUSPAR) 10 MG tablet, Take 1 tablet (10 mg total) by mouth 2 (two) times daily. (Patient not taking: No sig reported), Disp: 60 tablet, Rfl: 3   cyclobenzaprine (FLEXERIL) 5 MG tablet, Take 1 tablet (5 mg total) by mouth 3 (three) times daily as needed for muscle spasms. (Patient not taking: Reported on 02/24/2020), Disp: 30 tablet, Rfl: 1   fluticasone furoate-vilanterol (BREO ELLIPTA) 100-25 MCG/INH AEPB, Inhale 1 puff into the lungs daily., Disp: 60 each, Rfl: 5   venlafaxine XR (EFFEXOR-XR) 75 MG 24 hr capsule, Take 3 capsules (225 mg total) by mouth daily with breakfast.,  Disp: 90 capsule, Rfl: 2  EXAM:  VITALS per patient if applicable:  GENERAL: alert, oriented, appears well and in no acute distress  PSYCH/NEURO: pleasant and cooperative, no obvious depression or anxiety, speech and thought processing grossly intact  ASSESSMENT AND PLAN:  Discussed the following assessment and plan:  Sinusitis Zpack    Sleep apnea, unspecified type - Plan: Ambulatory referral to Pulmonology needs sleep study COVID-19 11/2020 Chronic bronchitis, unspecified chronic bronchitis type ([place]) - Plan: fluticasone furoate-vilanterol (BREO ELLIPTA) 100-25 MCG/INH AEPB, Ambulatory referral to Pulmonology Declines prednisone for now  Continue  prn albuterol   Depressive disorder - Plan: venlafaxine XR (EFFEXOR-XR) 225 MG 24 hr capsule, Anxiety and depression - Plan: venlafaxine XR (EFFEXOR-XR) 225 qd Rec pt call [facility] I believe she has therapy  Consider stop effexor due to risk htn elevation   Hypertension, unspecified type - Plan: Comprehensive metabolic panel, Lipid panel, CBC with Differential/Platelet Dyazide 37.5-25 2 pills day  Consider change arb/hctz and consider add diuretic if uncontrolled  Sch nurse visit BP check 1 week and fasting labs   Hyperglycemia - Plan: Hemoglobin A1c  Screening mammogram, encounter for - Plan: MM 3D SCREEN BREAST BILATERAL Call to schedule   Routine cervical smear - Plan: Ambulatory referral to Obstetrics / Gynecology needs pap     -we discussed possible serious and likely etiologies, options for evaluation and workup, limitations of telemedicine visit vs in person visit, treatment, treatment risks and precautions. Pt prefers to treat via telemedicine empirically rather than in person at this moment.   I discussed the assessment and treatment plan with the patient. The patient was provided an opportunity to ask questions and all were answered. The patient agreed with the plan and demonstrated an understanding of the instructions.    Time spent 30 min Delorise Jackson, MD

## 2021-01-03 ENCOUNTER — Telehealth: Payer: Self-pay

## 2021-01-03 NOTE — Telephone Encounter (Signed)
LVM to Return for 1 week fasting labs and BP check, f/u PCP in person 2-3 months

## 2021-01-11 ENCOUNTER — Encounter: Payer: Self-pay | Admitting: Internal Medicine

## 2021-01-15 NOTE — Telephone Encounter (Signed)
Per note, states has stopped steroid inhaler and is feeling better.  If persistent mouth irritation, may need nystatin suspension.  Also monitor breathing off steroid inhaler.  If persistent symptoms, will need to be reevaluated.

## 2021-01-15 NOTE — Telephone Encounter (Signed)
Patient scheduled for ed follow up Friday 01/24/21.   States the fluticasone furoate-vilanterol (BREO ELLIPTA) 100-25 MCG/INH AEPB is causing her mouth and throat soreness, also has caused her tongue to break out. Stopped using this yesterday and states mouth and throat feels a bit better.   Please advise

## 2021-01-21 ENCOUNTER — Other Ambulatory Visit: Payer: Self-pay | Admitting: Family

## 2021-01-21 DIAGNOSIS — B379 Candidiasis, unspecified: Secondary | ICD-10-CM

## 2021-01-21 MED ORDER — FLUCONAZOLE 150 MG PO TABS: 150.0000 mg | ORAL_TABLET | Freq: Once | ORAL | 0 refills | Status: AC

## 2021-01-21 MED ORDER — MAGIC MOUTHWASH W/LIDOCAINE: 5.0000 mL | Freq: Three times a day (TID) | ORAL | 0 refills | Status: DC | PRN

## 2021-01-21 NOTE — Telephone Encounter (Signed)
Patient has been informed.

## 2021-01-21 NOTE — Telephone Encounter (Signed)
Patient calling back in and states that her symptoms have not cleared up over the weekend.   Patient is requesting the mouth wash. Also states she was on an antibiotic and now has discharge. Requesting a medication for vaginal yeast infection as well.   Please advise

## 2021-01-21 NOTE — Progress Notes (Signed)
close

## 2021-01-25 ENCOUNTER — Encounter: Payer: Self-pay | Admitting: Family Medicine

## 2021-01-25 ENCOUNTER — Ambulatory Visit: Payer: BC Managed Care – PPO | Admitting: Family Medicine

## 2021-01-25 ENCOUNTER — Other Ambulatory Visit: Payer: Self-pay

## 2021-01-25 VITALS — BP 120/70 | HR 90 | Temp 98.4°F | Ht 61.0 in | Wt 237.2 lb

## 2021-01-25 DIAGNOSIS — H938X3 Other specified disorders of ear, bilateral: Secondary | ICD-10-CM

## 2021-01-25 DIAGNOSIS — B379 Candidiasis, unspecified: Secondary | ICD-10-CM

## 2021-01-25 DIAGNOSIS — I10 Essential (primary) hypertension: Secondary | ICD-10-CM | POA: Diagnosis not present

## 2021-01-25 DIAGNOSIS — R791 Abnormal coagulation profile: Secondary | ICD-10-CM | POA: Diagnosis not present

## 2021-01-25 DIAGNOSIS — M799 Soft tissue disorder, unspecified: Secondary | ICD-10-CM

## 2021-01-25 DIAGNOSIS — R040 Epistaxis: Secondary | ICD-10-CM

## 2021-01-25 MED ORDER — AZELASTINE HCL 0.1 % NA SOLN: 2.0000 | Freq: Two times a day (BID) | NASAL | 12 refills | Status: DC

## 2021-01-25 MED ORDER — MAGIC MOUTHWASH W/LIDOCAINE
5.0000 mL | Freq: Three times a day (TID) | ORAL | 0 refills | Status: AC | PRN
Start: 2021-01-25 — End: 2021-02-04

## 2021-01-25 NOTE — Patient Instructions (Signed)
Nice to see you. Lets try the nose spray called Astelin.  If this is not helpful for your years please let us know. Please keep your salt intake down. Somebody should call you to schedule an ultrasound.

## 2021-01-26 ENCOUNTER — Other Ambulatory Visit: Payer: Self-pay | Admitting: Internal Medicine

## 2021-01-26 DIAGNOSIS — J9801 Acute bronchospasm: Secondary | ICD-10-CM

## 2021-01-26 LAB — PROTIME-INR
INR: 1
Prothrombin Time: 9.8 s (ref 9.0–11.5)

## 2021-01-28 ENCOUNTER — Other Ambulatory Visit: Payer: Self-pay | Admitting: Internal Medicine

## 2021-01-28 DIAGNOSIS — J9801 Acute bronchospasm: Secondary | ICD-10-CM

## 2021-01-28 NOTE — Progress Notes (Signed)
Unfortunately this test is time sensitive & can NOT be added.

## 2021-01-29 ENCOUNTER — Telehealth: Payer: Self-pay | Admitting: Family Medicine

## 2021-01-29 DIAGNOSIS — R040 Epistaxis: Secondary | ICD-10-CM | POA: Insufficient documentation

## 2021-01-29 DIAGNOSIS — H938X9 Other specified disorders of ear, unspecified ear: Secondary | ICD-10-CM | POA: Insufficient documentation

## 2021-01-29 DIAGNOSIS — R791 Abnormal coagulation profile: Secondary | ICD-10-CM | POA: Insufficient documentation

## 2021-01-29 DIAGNOSIS — M799 Soft tissue disorder, unspecified: Secondary | ICD-10-CM | POA: Insufficient documentation

## 2021-01-29 NOTE — Assessment & Plan Note (Signed)
This is a chronic issue.  We will obtain an ultrasound to evaluate further given the pain.

## 2021-01-29 NOTE — Telephone Encounter (Signed)
Can you call the patient and let her know that I ordered the wrong lab test to follow-up from her abnormal lab when she was in the emergency department?  I apologize for this.  I tried to get this added on though they were unable to do this.  I have placed another order and would like her to have this done in the next week or 2.

## 2021-01-29 NOTE — Assessment & Plan Note (Signed)
This was elevated during her ED visit.  I inadvertently ordered a PT/INR instead of a PTT.  I will have somebody contact her to let her know this so we can redraw her blood work.

## 2021-01-29 NOTE — Telephone Encounter (Signed)
I called and LVM for patient to call back.  Trenyce Loera,cma  

## 2021-01-29 NOTE — Assessment & Plan Note (Signed)
Adequately controlled.  She will continue Dyazide.

## 2021-01-29 NOTE — Assessment & Plan Note (Signed)
Resolved.  At this point I think the Mucinex was playing a role.  She will not take that in the future.  She will monitor for recurrence.

## 2021-01-29 NOTE — Assessment & Plan Note (Signed)
Possibly allergy related.  We will trial Astelin.  If this is not beneficial she will let me know and we will refer to ENT.

## 2021-01-29 NOTE — Progress Notes (Signed)
Tommi Rumps, MD Phone: [phone]  Judy Garcia is a 50 y.o. female who presents today for follow-up.  Epistaxis: Patient notes she had intermittent nosebleeds for a week or so.  She was taking Claritin and had previously had COVID.  She was also on Mucinex.  She notes its not uncommon for her to get a nosebleed when she is on Mucinex.  She has not had any recurrent nosebleeds since being evaluated in the emergency department.  Attention: Not checking her blood pressure.  She is on Dyazide.  No chest pain, shortness of breath, or edema.  She has drastically decreased her salt intake.  Ear irritation: Reports her ear canals feel wet and her internal ears feel wet as well.  She notes her ears feel full.  She notes no tinnitus.  She does have chronic nasal congestion.  She notes her taste has been decreased since having COVID.  Soft tissue lesion: Patient notes this is on her left mid back.  Notes it hurts at times and is tender.  It has been there for quite some time and she has had numerous people look at it though no work-up has been completed.  Social History   Tobacco Use  Smoking Status Every Day   Packs/day: 1.00   Years: 25.00   Pack years: 25.00   Types: Cigarettes  Smokeless Tobacco Never    Current Outpatient Medications on File Prior to Visit  Medication Sig Dispense Refill   Ascorbic Acid (VITAMIN C) 100 MG tablet Take 100 mg by mouth as needed.     Multiple Vitamin (MULTIVITAMIN) tablet Take 1 tablet by mouth daily.     triamterene-hydrochlorothiazide (DYAZIDE) 37.5-25 MG capsule TAKE 2 CAPSULES BY MOUTH DAILY 180 capsule 1   venlafaxine XR (EFFEXOR-XR) 75 MG 24 hr capsule Take 3 capsules (225 mg total) by mouth daily with breakfast. 90 capsule 2   busPIRone (BUSPAR) 10 MG tablet Take 1 tablet (10 mg total) by mouth 2 (two) times daily. (Patient not taking: Reported on 01/25/2021) 60 tablet 3   cyclobenzaprine (FLEXERIL) 5 MG tablet Take 1 tablet (5 mg total) by  mouth 3 (three) times daily as needed for muscle spasms. (Patient not taking: Reported on 02/24/2020) 30 tablet 1   docusate sodium (COLACE) 100 MG capsule Take 1 capsule (100 mg total) by mouth daily. (Patient not taking: Reported on 01/25/2021) 90 capsule 1   fluconazole (DIFLUCAN) 150 MG tablet Take 150 mg by mouth every 3 (three) days.     fluticasone furoate-vilanterol (BREO ELLIPTA) 100-25 MCG/INH AEPB Inhale 1 puff into the lungs daily. (Patient not taking: Reported on 01/25/2021) 60 each 5   No current facility-administered medications on file prior to visit.     ROS see history of present illness  Objective  Physical Exam Vitals:   01/25/21 1523  BP: 120/70  Pulse: 90  Temp: 98.4 F (36.9 C)  SpO2: 97%    BP Readings from Last 3 Encounters:  01/25/21 120/70  01/02/21 (!) 169/93  06/21/20 134/79   Wt Readings from Last 3 Encounters:  01/25/21 237 lb 3.2 oz (107.6 kg)  01/02/21 235 lb (106.6 kg)  06/21/20 235 lb (106.6 kg)    Physical Exam Constitutional:      General: She is not in acute distress.    Appearance: She is not diaphoretic.  HENT:     Right Ear: Tympanic membrane and ear canal normal.     Left Ear: Tympanic membrane and ear canal normal.  Cardiovascular:  Rate and Rhythm: Normal rate and regular rhythm.     Heart sounds: Normal heart sounds.  Pulmonary:     Effort: Pulmonary effort is normal.     Breath sounds: Normal breath sounds.  Musculoskeletal:       Back:  Skin:    General: Skin is warm and dry.  Neurological:     Mental Status: She is alert.     Assessment/Plan: Please see individual problem list.  Problem List Items Addressed This Visit     Abnormal partial thromboplastin time (PTT)    This was elevated during her ED visit.  I inadvertently ordered a PT/INR instead of a PTT.  I will have somebody contact her to let her know this so we can redraw her blood work.      Ear fullness    Possibly allergy related.  We will trial  Astelin.  If this is not beneficial she will let me know and we will refer to ENT.      Relevant Medications   azelastine (ASTELIN) 0.1 % nasal spray   Epistaxis    Resolved.  At this point I think the Mucinex was playing a role.  She will not take that in the future.  She will monitor for recurrence.      Essential hypertension    Adequately controlled.  She will continue Dyazide.      Soft tissue lesion - Primary    This is a chronic issue.  We will obtain an ultrasound to evaluate further given the pain.      Relevant Orders   [place] CHEST SOFT TISSUE   Other Visit Diagnoses     Yeast infection       Relevant Medications   magic mouthwash w/lidocaine SOLN   fluconazole (DIFLUCAN) 150 MG tablet   Protime increased       Relevant Orders   Protime-INR (Completed)       Return in about 3 months (around 04/27/2021) for Diabetic kidney function note chronic.  This visit occurred during the SARS-CoV-2 public health emergency.  Safety protocols were in place, including screening questions prior to the visit, additional usage of staff PPE, and extensive cleaning of exam room while observing appropriate contact time as indicated for disinfecting solutions.    Tommi Rumps, MD [place]

## 2021-01-30 NOTE — Telephone Encounter (Signed)
I called and spoke with the patient and she scheduled a lab appointment to get labs that were missed and she understood.  Judy Garcia,cma

## 2021-01-31 ENCOUNTER — Telehealth: Payer: Self-pay | Admitting: Adult Health

## 2021-01-31 NOTE — Telephone Encounter (Signed)
Lft pt vm to call ofc to sch [facility]. thanks ?

## 2021-02-05 ENCOUNTER — Telehealth: Payer: Self-pay | Admitting: Adult Health

## 2021-02-05 NOTE — Telephone Encounter (Signed)
Lft pt vm to call ofc to sch [place] chest. thanks

## 2021-02-07 ENCOUNTER — Other Ambulatory Visit: Payer: BC Managed Care – PPO

## 2021-02-12 ENCOUNTER — Telehealth (INDEPENDENT_AMBULATORY_CARE_PROVIDER_SITE_OTHER): Payer: BC Managed Care – PPO | Admitting: Family Medicine

## 2021-02-12 ENCOUNTER — Encounter: Payer: Self-pay | Admitting: Family Medicine

## 2021-02-12 DIAGNOSIS — R600 Localized edema: Secondary | ICD-10-CM

## 2021-02-12 NOTE — Progress Notes (Signed)
Virtual Visit via Video Note  I connected with Judy Garcia  on [id number] at  5:20 PM EDT by a video enabled telemedicine application and verified that I am speaking with the correct person using two identifiers.  Location patient: home, [place] Location provider:work or home office Persons participating in the virtual visit: patient, provider  I discussed the limitations of evaluation and management by telemedicine and the availability of in person appointments. The patient expressed understanding and agreed to proceed.   HPI:  Acute telemedicine visit for leg swelling: -Onset: 4 days ago -Symptoms include:  lower leg, reports her entire leg swelled up and became red and hot around "a mole I have had for years" and the mole looks different now, pain, pain with walking or touching leg, reports leg feels tense and hard to touch, thinks got a single ant bite on her ankle prior to this -Denies: fevers, chills, CP, SOB, malaise -Pertinent past medical history: see below -Pertinent medication allergies: No Known Allergies  ROS: See pertinent positives and negatives per HPI.  Past Medical History:  Diagnosis Date   Alcoholism and drug addiction in family    Allergy    Back pain 08/09/2019   Chicken pox    COVID-19    11/2020   Depression    Eating disorder    GERD (gastroesophageal reflux disease)    History of lipoma 08/09/2019   Hypertension    Sleep disturbance 08/11/2019   Ulcer (traumatic) of oral mucosa    UTI (urinary tract infection)     Past Surgical History:  Procedure Laterality Date   HERNIA REPAIR     TONSILLECTOMY     TUBAL LIGATION     UTERINE FIBROID SURGERY       Current Outpatient Medications:    albuterol (VENTOLIN HFA) 108 (90 Base) MCG/ACT inhaler, INHALE 1 TO 2 PUFFS INTO THE LUNGS EVERY 6 HOURS AS NEEDED FOR WHEEZING OR SHORTNESS OF BREATH, Disp: 54 g, Rfl: 0   Ascorbic Acid (VITAMIN C) 100 MG tablet, Take 100 mg by mouth as needed., Disp: , Rfl:    azelastine  (ASTELIN) 0.1 % nasal spray, Place 2 sprays into both nostrils 2 (two) times daily. Use in each nostril as directed, Disp: 30 mL, Rfl: 12   docusate sodium (COLACE) 100 MG capsule, Take 1 capsule (100 mg total) by mouth daily., Disp: 90 capsule, Rfl: 1   fluticasone furoate-vilanterol (BREO ELLIPTA) 100-25 MCG/INH AEPB, Inhale 1 puff into the lungs daily., Disp: 60 each, Rfl: 5   Multiple Vitamin (MULTIVITAMIN) tablet, Take 1 tablet by mouth daily., Disp: , Rfl:    PROAIR RESPICLICK 123XX123 (90 Base) MCG/ACT AEPB, INHALE 2 PUFFS INTO THE LUNGS FOUR TIMES DAILY AS NEEDED, Disp: 1 each, Rfl: 0   triamterene-hydrochlorothiazide (DYAZIDE) 37.5-25 MG capsule, TAKE 2 CAPSULES BY MOUTH DAILY, Disp: 180 capsule, Rfl: 1   venlafaxine XR (EFFEXOR-XR) 75 MG 24 hr capsule, Take 3 capsules (225 mg total) by mouth daily with breakfast., Disp: 90 capsule, Rfl: 2  EXAM:  VITALS per patient if applicable:  GENERAL: alert, oriented, appears well and in no acute distress  HEENT: atraumatic, conjunttiva clear, no obvious abnormalities on inspection of external nose and ears  NECK: normal movements of the head and neck  LUNGS: on inspection no signs of respiratory distress, breathing rate appears normal, no obvious gross SOB, gasping or wheezing  CV: no obvious cyanosis, impressive edema throughout in the R LE below the knee  SKIN: erythema R LE, indented  brown macule R LE  MS: moves all visible extremities without noticeable abnormality  PSYCH/NEURO: pleasant and cooperative, no obvious depression or anxiety, speech and thought processing grossly intact  ASSESSMENT AND PLAN:  Discussed the following assessment and plan:  Leg edema    Discussed potential etiologies, work-up, precautions and potential complications.  Discussed the possibility of DVT, infection, local reaction, compartment syndrome versus other.  Given severity of reported symptoms advised prompt in person evaluation and discussed options  for care this evening.  She plans to go to urgent care or the med center.  Also advised she will need a work-up for the mole if it is not evaluated during this acute visit.  She agrees to schedule follow-up with her primary care doctor.    I discussed the assessment and treatment plan with the patient. The patient was provided an opportunity to ask questions and all were answered. The patient agreed with the plan and demonstrated an understanding of the instructions.     Lucretia Kern, DO

## 2021-02-12 NOTE — Patient Instructions (Signed)
Please seek prompt in person evaluation today as we discussed at the urgent care or the med center.  If you are having severe or life-threatening symptoms please call 911.  Also, please schedule a follow-up visit with your primary care office regarding the mole.   I hope you are feeling better soon!    It was nice to meet you today. I help [facility] out with telemedicine visits on Tuesdays and Thursdays and am available for visits on those days. If you have any concerns or questions following this visit please schedule a follow up visit with your Primary Care doctor or seek care at a local urgent care clinic to avoid delays in care.

## 2021-02-15 ENCOUNTER — Other Ambulatory Visit: Payer: BC Managed Care – PPO

## 2021-02-15 NOTE — Addendum Note (Signed)
Addended by: Leeanne Rio on: 02/15/2021 02:33 PM   Modules accepted: Orders

## 2021-02-21 ENCOUNTER — Encounter: Payer: Self-pay | Admitting: Pulmonary Disease

## 2021-02-21 ENCOUNTER — Ambulatory Visit
Admission: RE | Admit: 2021-02-21 | Discharge: 2021-02-21 | Disposition: A | Discharge status: Home / Self Care | Payer: BC Managed Care – PPO | Source: Ambulatory Visit | Attending: Pulmonary Disease | Admitting: Pulmonary Disease

## 2021-02-21 ENCOUNTER — Other Ambulatory Visit: Payer: BC Managed Care – PPO

## 2021-02-21 ENCOUNTER — Other Ambulatory Visit: Payer: Self-pay

## 2021-02-21 ENCOUNTER — Ambulatory Visit (INDEPENDENT_AMBULATORY_CARE_PROVIDER_SITE_OTHER): Payer: BC Managed Care – PPO | Admitting: Pulmonary Disease

## 2021-02-21 ENCOUNTER — Ambulatory Visit
Admission: RE | Admit: 2021-02-21 | Discharge: 2021-02-21 | Disposition: A | Discharge status: Home / Self Care | Payer: BC Managed Care – PPO | Attending: Pulmonary Disease | Admitting: Pulmonary Disease

## 2021-02-21 ENCOUNTER — Ambulatory Visit
Admission: RE | Admit: 2021-02-21 | Discharge: 2021-02-21 | Disposition: A | Discharge status: Home / Self Care | Payer: BC Managed Care – PPO | Source: Ambulatory Visit | Attending: Family Medicine | Admitting: Family Medicine

## 2021-02-21 VITALS — BP 150/80 | HR 106 | Temp 98.1°F | Ht 61.0 in | Wt 242.2 lb

## 2021-02-21 DIAGNOSIS — M799 Soft tissue disorder, unspecified: Secondary | ICD-10-CM | POA: Insufficient documentation

## 2021-02-21 DIAGNOSIS — R059 Cough, unspecified: Secondary | ICD-10-CM

## 2021-02-21 DIAGNOSIS — R0683 Snoring: Secondary | ICD-10-CM

## 2021-02-21 NOTE — Progress Notes (Signed)
[facility]  Chief Complaint  Patient presents with   Consult    [place]sa-    Constitutional:  BP (!) 150/80 (BP Location: Left Arm, Patient Position: Sitting, Cuff Size: Normal)   Pulse (!) 106   Temp 98.1 F (36.7 C) (Oral)   Ht 5\' 1"  (1.549 m)   Wt 242 lb 3.2 oz (109.9 kg)   SpO2 100%   BMI 45.76 kg/m   Past Medical History:  Allergies, Chicken pox, COVID 18 December 2020, Depression, GERD, HTN  Past Surgical History:  She  has a past surgical history that includes Uterine fibroid surgery; Hernia repair; Tubal ligation; and Tonsillectomy.  Brief Summary:  Judy Garcia is a 50 y.o. female smoker with snoring and cough.      Subjective:   She was seen by Dr. Ander Slade in 2021 for snoring and wheezing.  She was to have PFT and home sleep study.  She lost her insurance and wasn't able to complete testing.  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  She still has cough and wheeze.  This improved after she was treated with antibiotics and steroids for COVID infection in July 2022.  She has been using breo and albuterol, and these help.  She continues to smoke 1 ppd.  She tried zyban before and this helped, but she is worried about how this would impact her depression now.  Her grandchild will be born on October 17 and she has set this as her quit date, and she plans to use nicotine patch.    She snores still and her husband has to wake her up to stop snoring.  She wakes up every hour while asleep.  She does gets cramps in her legs when asleep.  She works 3rd shift.  She gets about 5 to 6 hours sleep per day.  She drinks Red Bulls constantly while at work.  She is not using anything to help her fall asleep.  She denies sleep walking, sleep talking, bruxism, or nightmares.  There is no history of restless legs.  She denies sleep hallucinations, sleep paralysis, or cataplexy.  The Epworth score is 19 out of  24.   Physical Exam:   Appearance - well kempt   ENMT - no sinus tenderness, no oral exudate, no LAN, Mallampati 4 airway, no stridor  Respiratory - equal breath sounds bilaterally, no wheezing or rales  CV - s1s2 regular rate and rhythm, no murmurs  Ext - no clubbing, no edema  Skin - no rashes  Psych - normal mood and affect   Pulmonary testing:    Chest Imaging:    Sleep Tests:    Social History:  She  reports that she has been smoking cigarettes. She has a 25.00 pack-year smoking history. She has never used smokeless tobacco. She reports that she does not drink alcohol and does not use drugs.  Family History:  Her family history includes Cancer in her maternal grandmother; Diabetes in her mother.    Discussion:  She has cough, wheeze and chest congestion with history of tobacco abuse.  She could have obstructive lung disease.  She has snoring, sleep disruption, apnea, and daytime sleepiness.  She hypertension and depression.  Her BMI is > 35.  I am concerned she could have obstructive sleep apnea.  Assessment/Plan:   Cough. - continue breo, prn albuterol - will arrange for chest xray and pulmonary function test  Tobacco abuse. - advised to discuss with PCP if it would be okay to use bupropion or chantix - she has set the birth date of her grandchild as her quit date, and will try using nicotine patch  Snoring with excessive daytime sleepiness. - will need to arrange for a home sleep study  Obesity. - discussed how weight can impact sleep and risk for sleep disordered breathing - discussed options to assist with weight loss: combination of diet modification, cardiovascular and strength training exercises  Cardiovascular risk. - had an extensive discussion regarding the adverse health consequences related to untreated sleep disordered breathing - specifically discussed the risks for hypertension, coronary artery disease, cardiac dysrhythmias,  cerebrovascular disease, and diabetes - lifestyle modification discussed  Safe driving practices. - discussed how sleep disruption can increase risk of accidents, particularly when driving - safe driving practices were discussed  Therapies for obstructive sleep apnea. - if the sleep study shows significant sleep apnea, then various therapies for treatment were reviewed: CPAP, oral appliance, and surgical interventions   Time Spent Involved in Patient Care on Day of Examination:  43 minutes  Follow up:   Patient Instructions  Chest xray today  Will arrange for pulmonary function test  Will arrange for home sleep study  Will call to arrange for follow up after sleep study reviewed  Medication List:   Allergies as of 02/21/2021   No Known Allergies      Medication List        Accurate as of February 21, 2021  9:53 AM. If you have any questions, ask your nurse or doctor.          albuterol 108 (90 Base) MCG/ACT inhaler Commonly known as: VENTOLIN HFA INHALE 1 TO 2 PUFFS INTO THE LUNGS EVERY 6 HOURS AS NEEDED FOR WHEEZING OR SHORTNESS OF BREATH   ProAir RespiClick 060 (90 Base) MCG/ACT Aepb Generic drug: Albuterol Sulfate INHALE 2 PUFFS INTO THE LUNGS FOUR TIMES DAILY AS NEEDED   azelastine 0.1 % nasal spray Commonly known as: ASTELIN Place 2 sprays into both nostrils 2 (two) times daily. Use in each nostril as directed   docusate sodium 100 MG capsule Commonly known as: Colace Take 1 capsule (100 mg total) by mouth daily.   fluticasone furoate-vilanterol 100-25 MCG/INH Aepb Commonly known as: Breo Ellipta Inhale 1 puff into the lungs daily.   multivitamin tablet Take 1 tablet by mouth daily.   triamterene-hydrochlorothiazide 37.5-25 MG capsule Commonly known as: DYAZIDE TAKE 2 CAPSULES BY MOUTH DAILY   venlafaxine XR 75 MG 24 hr capsule Commonly known as: EFFEXOR-XR Take 3 capsules (225 mg total) by mouth daily with breakfast.   vitamin C 100 MG  tablet Take 100 mg by mouth as needed.        Signature:  Chesley Mires, MD [facility] Pager - [phone] 02/21/2021, 9:53 AM

## 2021-02-21 NOTE — Patient Instructions (Signed)
Chest xray today  Will arrange for pulmonary function test  Will arrange for home sleep study  Will call to arrange for follow up after sleep study reviewed

## 2021-02-22 ENCOUNTER — Ambulatory Visit: Payer: BC Managed Care – PPO

## 2021-03-05 ENCOUNTER — Other Ambulatory Visit: Payer: Self-pay | Admitting: Family Medicine

## 2021-03-05 DIAGNOSIS — M799 Soft tissue disorder, unspecified: Secondary | ICD-10-CM

## 2021-03-08 ENCOUNTER — Telehealth: Payer: Self-pay

## 2021-03-08 NOTE — Telephone Encounter (Signed)
Called and spoke to patient about upcoming COVID test, nothing further needed.

## 2021-03-13 ENCOUNTER — Other Ambulatory Visit: Payer: Self-pay

## 2021-03-13 ENCOUNTER — Other Ambulatory Visit
Admission: RE | Admit: 2021-03-13 | Discharge: 2021-03-13 | Disposition: A | Discharge status: Home / Self Care | Payer: BC Managed Care – PPO | Source: Ambulatory Visit | Attending: Pulmonary Disease | Admitting: Pulmonary Disease

## 2021-03-13 DIAGNOSIS — Z20822 Contact with and (suspected) exposure to covid-19: Secondary | ICD-10-CM | POA: Diagnosis not present

## 2021-03-13 DIAGNOSIS — Z01812 Encounter for preprocedural laboratory examination: Secondary | ICD-10-CM | POA: Diagnosis not present

## 2021-03-13 LAB — SARS CORONAVIRUS 2 (TAT 6-24 HRS): SARS Coronavirus 2: NEGATIVE

## 2021-03-14 ENCOUNTER — Ambulatory Visit: Payer: BC Managed Care – PPO | Attending: Pulmonary Disease

## 2021-03-14 DIAGNOSIS — R059 Cough, unspecified: Secondary | ICD-10-CM | POA: Diagnosis not present

## 2021-03-14 MED ORDER — ALBUTEROL SULFATE (2.5 MG/3ML) 0.083% IN NEBU
2.5000 mg | INHALATION_SOLUTION | Freq: Once | RESPIRATORY_TRACT | Status: AC
  Administered 2021-03-14: 2.5 mg via RESPIRATORY_TRACT
  Filled 2021-03-14: qty 3

## 2021-04-04 ENCOUNTER — Telehealth: Payer: Self-pay | Admitting: Adult Health

## 2021-04-04 NOTE — Telephone Encounter (Signed)
Pt called in requesting to be a New Pt of Dr. Caryl Bis. Advise Pt that Dr. Caryl Bis is not accepting new Pt at this time. Pt stated that her primary care doctor is Dr. Claudina Lick. Pt insists for someone to ask Dr. Caryl Bis would he take her as new patient. Pt would like callback.

## 2021-04-07 ENCOUNTER — Other Ambulatory Visit: Payer: Self-pay | Admitting: Internal Medicine

## 2021-04-07 DIAGNOSIS — F419 Anxiety disorder, unspecified: Secondary | ICD-10-CM

## 2021-04-07 DIAGNOSIS — F32A Depression, unspecified: Secondary | ICD-10-CM

## 2021-04-07 DIAGNOSIS — Z760 Encounter for issue of repeat prescription: Secondary | ICD-10-CM

## 2021-04-09 NOTE — Telephone Encounter (Signed)
Pt called in requesting to be a New Pt of Dr. Caryl Bis. Advise Pt that Dr. Caryl Bis is not accepting new Pt at this time. Pt stated that her primary care doctor is Dr. Claudina Lick. Pt insists for someone to ask Dr. Caryl Bis would he take her as new patient. Pt would like callback.  Mohd. Derflinger,cma

## 2021-04-10 ENCOUNTER — Telehealth: Payer: Self-pay | Admitting: Adult Health

## 2021-04-10 NOTE — Telephone Encounter (Signed)
Rejection Reason - Patient Declined" Judy Garcia said on Apr 10, 2021 10:48 AM  Msg from [facility] ob/gyn

## 2021-04-10 NOTE — Telephone Encounter (Signed)
I called and informed the patient that the provider is not accepting new patient, she had a fall and wanted her leg checked, she is a patient of M. Flinchum and I informed her that Flinchum was back and she is scheduled to see her tomorrow.  Christana Angelica,cma

## 2021-04-10 NOTE — Telephone Encounter (Signed)
I am not taking any new patients at this time.

## 2021-04-11 ENCOUNTER — Ambulatory Visit: Admission: RE | Admit: 2021-04-11 | Payer: BC Managed Care – PPO | Source: Ambulatory Visit

## 2021-04-11 ENCOUNTER — Other Ambulatory Visit: Payer: Self-pay

## 2021-04-11 ENCOUNTER — Ambulatory Visit
Admission: RE | Admit: 2021-04-11 | Discharge: 2021-04-11 | Disposition: A | Discharge status: Home / Self Care | Payer: BC Managed Care – PPO | Source: Ambulatory Visit | Attending: Adult Health | Admitting: Adult Health

## 2021-04-11 ENCOUNTER — Telehealth: Payer: Self-pay

## 2021-04-11 ENCOUNTER — Encounter: Payer: Self-pay | Admitting: Adult Health

## 2021-04-11 ENCOUNTER — Ambulatory Visit (INDEPENDENT_AMBULATORY_CARE_PROVIDER_SITE_OTHER): Payer: BC Managed Care – PPO | Admitting: Adult Health

## 2021-04-11 VITALS — BP 132/88 | HR 106 | Temp 96.1°F | Ht 60.98 in | Wt 243.8 lb

## 2021-04-11 DIAGNOSIS — M25561 Pain in right knee: Secondary | ICD-10-CM

## 2021-04-11 DIAGNOSIS — M7989 Other specified soft tissue disorders: Secondary | ICD-10-CM | POA: Insufficient documentation

## 2021-04-11 DIAGNOSIS — M79604 Pain in right leg: Secondary | ICD-10-CM | POA: Insufficient documentation

## 2021-04-11 DIAGNOSIS — F17200 Nicotine dependence, unspecified, uncomplicated: Secondary | ICD-10-CM | POA: Diagnosis not present

## 2021-04-11 DIAGNOSIS — D229 Melanocytic nevi, unspecified: Secondary | ICD-10-CM | POA: Diagnosis not present

## 2021-04-11 DIAGNOSIS — W19XXXA Unspecified fall, initial encounter: Secondary | ICD-10-CM | POA: Insufficient documentation

## 2021-04-11 DIAGNOSIS — M25551 Pain in right hip: Secondary | ICD-10-CM

## 2021-04-11 LAB — CBC WITH DIFFERENTIAL/PLATELET
Basophils Absolute: 0.1 10*3/uL (ref 0.0–0.1)
Basophils Relative: 0.7 % (ref 0.0–3.0)
Eosinophils Absolute: 0.2 10*3/uL (ref 0.0–0.7)
Eosinophils Relative: 2 % (ref 0.0–5.0)
HCT: 42.5 % (ref 36.0–46.0)
Hemoglobin: 13.6 g/dL (ref 12.0–15.0)
Lymphocytes Relative: 28.5 % (ref 12.0–46.0)
Lymphs Abs: 3.3 10*3/uL (ref 0.7–4.0)
MCHC: 32 g/dL (ref 30.0–36.0)
MCV: 84.8 fl (ref 78.0–100.0)
Monocytes Absolute: 0.7 10*3/uL (ref 0.1–1.0)
Monocytes Relative: 6.5 % (ref 3.0–12.0)
Neutro Abs: 7.1 10*3/uL (ref 1.4–7.7)
Neutrophils Relative %: 62.3 % (ref 43.0–77.0)
Platelets: 240 10*3/uL (ref 150.0–400.0)
RBC: 5.01 Mil/uL (ref 3.87–5.11)
RDW: 14.6 % (ref 11.5–15.5)
WBC: 11.4 10*3/uL — ABNORMAL HIGH (ref 4.0–10.5)

## 2021-04-11 LAB — D-DIMER, QUANTITATIVE: D-Dimer, Quant: 0.35 mcg/mL FEU (ref <0.50)

## 2021-04-11 LAB — COMPREHENSIVE METABOLIC PANEL
ALT: 24 U/L (ref 0–35)
AST: 25 U/L (ref 0–37)
Albumin: 4.2 g/dL (ref 3.5–5.2)
Alkaline Phosphatase: 88 U/L (ref 39–117)
BUN: 16 mg/dL (ref 6–23)
CO2: 31 mEq/L (ref 19–32)
Calcium: 9.5 mg/dL (ref 8.4–10.5)
Chloride: 101 mEq/L (ref 96–112)
Creatinine, Ser: 0.6 mg/dL (ref 0.40–1.20)
GFR: 102.9 mL/min (ref >60.00)
Glucose, Bld: 139 mg/dL — ABNORMAL HIGH (ref 70–99)
Potassium: 3.2 mEq/L — ABNORMAL LOW (ref 3.5–5.1)
Sodium: 140 mEq/L (ref 135–145)
Total Bilirubin: 0.3 mg/dL (ref 0.2–1.2)
Total Protein: 7.2 g/dL (ref 6.0–8.3)

## 2021-04-11 NOTE — Telephone Encounter (Signed)
LMTCB in regards to lab results.  

## 2021-04-11 NOTE — Progress Notes (Signed)
IMPRESSION: 1. No displaced fracture or dislocation of the pelvis. 2. No displaced fracture or dislocation of the right hip or femur. 3. No fracture or dislocation of the right tibia or fibula.

## 2021-04-11 NOTE — Progress Notes (Signed)
Acute Office Visit  Subjective:    Patient ID: Judy Garcia, female    DOB: 1971/04/05, 50 y.o.   MRN: [id number]  Chief Complaint  Patient presents with   Leg Pain    Pt fell and hurt right leg two weeks ago. Pt reports symptoms of swelling in the leg    HPI Patient is in today for a fall that occurred two weeks ago she fell on her right leg on a patients room on wet floor.She fell on her right knee and has had pain there since then. She has been treated She has applied for workers compensation. She has also has right buttock/ hip pain since fall.   She prior to this had a cellulitis in leg that resolved, she was urged to go to ED but reports that she did not go. She has a mole on the back of the leg that is changing colors. She reports cellulitis and swelling improved but she keeps having recurrent swelling. This was in September.   Patient has not established care with this provider as of yet she no showed for new patient appointment on 08/22/20. Denies any history of DVT .  She has a pulmonologist.  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   Patient  denies any fever, body aches,chills, rash, chest pain, shortness of breath, nausea, vomiting, or diarrhea.    Past Medical History:  Diagnosis Date   Alcoholism and drug addiction in family    Allergy    Back pain 08/09/2019   Chicken pox    COVID-19    11/2020   Depression    Eating disorder    GERD (gastroesophageal reflux disease)    History of lipoma 08/09/2019   Hypertension    Sleep disturbance 08/11/2019   Ulcer (traumatic) of oral mucosa    UTI (urinary tract infection)     Past Surgical History:  Procedure Laterality Date   HERNIA REPAIR     TONSILLECTOMY     TUBAL LIGATION     UTERINE FIBROID SURGERY      Family History  Problem Relation Age of Onset   Diabetes Mother    Cancer Maternal Grandmother     Social History   Socioeconomic History   Marital status: Married    Spouse name: Not  on file   Number of children: Not on file   Years of education: Not on file   Highest education level: Not on file  Occupational History   Not on file  Tobacco Use   Smoking status: Every Day    Packs/day: 1.00    Years: 25.00    Pack years: 25.00    Types: Cigarettes   Smokeless tobacco: Never  Vaping Use   Vaping Use: Never used  Substance and Sexual Activity   Alcohol use: Never   Drug use: Never   Sexual activity: Yes    Birth control/protection: None  Other Topics Concern   Not on file  Social History Narrative   Not on file   Social Determinants of Health   Financial Resource Strain: Not on file  Food Insecurity: Not on file  Transportation Needs: Not on file  Physical Activity: Not on file  Stress: Not on file  Social Connections: Not on file  Intimate Partner Violence: Not on file    Outpatient Medications Prior to Visit  Medication Sig Dispense Refill   albuterol (VENTOLIN HFA) 108 (90 Base) MCG/ACT inhaler INHALE 1 TO 2 PUFFS INTO  THE LUNGS EVERY 6 HOURS AS NEEDED FOR WHEEZING OR SHORTNESS OF BREATH 54 g 0   Ascorbic Acid (VITAMIN C) 100 MG tablet Take 100 mg by mouth as needed.     azelastine (ASTELIN) 0.1 % nasal spray Place 2 sprays into both nostrils 2 (two) times daily. Use in each nostril as directed 30 mL 12   docusate sodium (COLACE) 100 MG capsule Take 1 capsule (100 mg total) by mouth daily. 90 capsule 1   fluticasone furoate-vilanterol (BREO ELLIPTA) 100-25 MCG/INH AEPB Inhale 1 puff into the lungs daily. 60 each 5   Multiple Vitamin (MULTIVITAMIN) tablet Take 1 tablet by mouth daily.     PROAIR RESPICLICK 357 (90 Base) MCG/ACT AEPB INHALE 2 PUFFS INTO THE LUNGS FOUR TIMES DAILY AS NEEDED 1 each 0   triamterene-hydrochlorothiazide (DYAZIDE) 37.5-25 MG capsule TAKE 2 CAPSULES BY MOUTH DAILY 180 capsule 1   venlafaxine XR (EFFEXOR-XR) 75 MG 24 hr capsule TAKE 3 CAPSULES(225 MG) BY MOUTH DAILY WITH BREAKFAST FOR 7 DAYS 90 capsule 2   No  facility-administered medications prior to visit.    No Known Allergies  Review of Systems  Constitutional:  Positive for fatigue. Negative for activity change, appetite change, chills, diaphoresis, fever and unexpected weight change.  HENT: Negative.    Respiratory: Negative.    Cardiovascular:  Positive for leg swelling (right leg pain lower, and right hip pain since fall, however history of cellulitus in september).  Gastrointestinal: Negative.   Musculoskeletal:  Positive for arthralgias. Negative for back pain, gait problem, joint swelling, myalgias, neck pain and neck stiffness.  Neurological: Negative.   Hematological:  Negative for adenopathy. Does not bruise/bleed easily.      Objective:    Physical Exam Vitals reviewed.  Constitutional:      General: She is not in acute distress.    Appearance: She is obese. She is not ill-appearing, toxic-appearing or diaphoretic.  HENT:     Head: Normocephalic and atraumatic.     Right Ear: External ear normal.     Left Ear: External ear normal.     Nose: Nose normal.     Mouth/Throat:     Mouth: Mucous membranes are moist.  Eyes:     Conjunctiva/sclera: Conjunctivae normal.  Cardiovascular:     Rate and Rhythm: Normal rate and regular rhythm.     Pulses: Normal pulses.     Heart sounds: Normal heart sounds.  Pulmonary:     Effort: Pulmonary effort is normal. No respiratory distress.     Breath sounds: Normal breath sounds. No stridor. No wheezing, rhonchi or rales.  Chest:     Chest wall: No tenderness.  Abdominal:     General: Bowel sounds are normal. There is no distension.  Musculoskeletal:        General: Swelling (right extremity.) and signs of injury present. No tenderness or deformity.     Cervical back: Normal range of motion and neck supple.     Right lower leg: Edema (swelling and pain in RLE and right hip.venous staining is noted right lower extremity.) present.     Left lower leg: No edema.  Skin:    General:  Skin is warm.     Findings: No bruising, erythema or lesion.  Neurological:     Mental Status: She is alert and oriented to person, place, and time.     Cranial Nerves: No cranial nerve deficit.     Sensory: No sensory deficit.     Motor: No  weakness.     Coordination: Coordination normal.     Gait: Gait abnormal.     Deep Tendon Reflexes: Reflexes normal.  Psychiatric:        Mood and Affect: Mood normal.        Behavior: Behavior normal.        Thought Content: Thought content normal.        Judgment: Judgment normal.    BP 132/88   Pulse (!) 106   Temp (!) 96.1 F (35.6 C)   Ht 5' 0.98" (1.549 m)   Wt 243 lb 12.8 oz (110.6 kg)   SpO2 99%   BMI 46.09 kg/m  Wt Readings from Last 3 Encounters:  04/11/21 243 lb 12.8 oz (110.6 kg)  02/21/21 242 lb 3.2 oz (109.9 kg)  01/25/21 237 lb 3.2 oz (107.6 kg)    Health Maintenance Due  Topic Date Due   HIV Screening  Never done   Hepatitis C Screening  Never done   COLONOSCOPY (Pts 45-31yrs Insurance coverage will need to be confirmed)  Never done   Pneumococcal Vaccine 43-108 Years old (2 - PCV) 11/08/2016   MAMMOGRAM  Never done   Zoster Vaccines- Shingrix (1 of 2) Never done   COVID-19 Vaccine (3 - Booster for Moderna series) 01/05/2020    There are no preventive care reminders to display for this patient.   Lab Results  Component Value Date   TSH 3.23 10/07/2018   Lab Results  Component Value Date   WBC 11.4 (H) 04/11/2021   HGB 13.6 04/11/2021   HCT 42.5 04/11/2021   MCV 84.8 04/11/2021   PLT 240.0 04/11/2021   Lab Results  Component Value Date   NA 140 04/11/2021   K 3.2 (L) 04/11/2021   CO2 31 04/11/2021   GLUCOSE 139 (H) 04/11/2021   BUN 16 04/11/2021   CREATININE 0.60 04/11/2021   BILITOT 0.3 04/11/2021   ALKPHOS 88 04/11/2021   AST 25 04/11/2021   ALT 24 04/11/2021   PROT 7.2 04/11/2021   ALBUMIN 4.2 04/11/2021   CALCIUM 9.5 04/11/2021   ANIONGAP 12 06/21/2020   GFR 102.90 04/11/2021   Lab  Results  Component Value Date   CHOL 200 08/11/2019   Lab Results  Component Value Date   HDL 55.00 08/11/2019   Lab Results  Component Value Date   LDLCALC 121 (H) 08/11/2019   Lab Results  Component Value Date   TRIG 118.0 08/11/2019   Lab Results  Component Value Date   CHOLHDL 4 08/11/2019   Lab Results  Component Value Date   HGBA1C 5.6 07/28/2018       Assessment & Plan:   Problem List Items Addressed This Visit       Other   Smoker   Relevant Orders   Ambulatory referral to Vascular Surgery   Hip pain, right   Relevant Orders   Ambulatory referral to Vascular Surgery   Acute pain of right knee   Relevant Orders   Ambulatory referral to Vascular Surgery   Leg swelling   Relevant Orders   DG Tibia/Fibula Right (Completed)   US Venous Img Lower Unilateral Right (DVT) (Completed)   D-Dimer, Quantitative (Completed)   DG FEMUR PORT, MIN 2 VIEWS RIGHT (Completed)   Ambulatory referral to Orthopedic Surgery   Ambulatory referral to Vascular Surgery   Fall   Relevant Orders   DG Tibia/Fibula Right (Completed)   US Venous Img Lower Unilateral Right (DVT) (Completed)   DG FEMUR  PORT, MIN 2 VIEWS RIGHT (Completed)   DG Pelvis 1-2 Views (Completed)   Ambulatory referral to Vascular Surgery   Atypical mole - Primary   Relevant Orders   CBC with Differential/Platelet (Completed)   Comprehensive metabolic panel (Completed)   Ambulatory referral to Dermatology   Ambulatory referral to Vascular Surgery    Acute patient, DVT stat ultrasound of right leg, x rays of right leg/ hip, will [facility] to [facility] since is workers compensation.   Strict ER precautions given, signs of DVT and pulmonary embolism discussed.  STAT DVT ultrasound ordered.  D dimer, cbc , cmp .  Referral to dermatology for changing atypical pigmented mole on right lower posterior calf.   She will follow up in one week, will go to ER if needed. She will need to schedule new patient  office vist for this clinic.   Return in about 1 week (around 04/18/2021), or if symptoms worsen or fail to improve, for at any time for any worsening symptoms, Go to Emergency room/ urgent care if worse.  Advised patient call the office or your primary care doctor for an appointment if no improvement within 72 hours or if any symptoms change or worsen at any time  Advised ER or urgent Care if after hours or on weekend. Call 911 for emergency symptoms at any time.Patinet verbalized understanding of all instructions given/reviewed and treatment plan and has no further questions or concerns at this time.    No orders of the defined types were placed in this encounter.    Marcille Buffy, FNP

## 2021-04-11 NOTE — Progress Notes (Signed)
IMPRESSION: 1. No displaced fracture or dislocation of the pelvis. 2. No displaced fracture or dislocation of the right hip or femur. 3. No fracture or dislocation of the right tibia or fibula.   Electronically Signed   By: Delanna Ahmadi M.D.   On: 04/11/2021 14:08

## 2021-04-11 NOTE — Progress Notes (Signed)
D dimer is negative

## 2021-04-11 NOTE — Progress Notes (Signed)
Addenda ADDENDUM REPORT: 04/11/2021 14:45 No DVT right leg  ADDENDUM: Addendum created to address labeled side  IMPRESSION: Directed duplex right lower extremity negative for DVT   Electronically Signed   By: Corrie Mckusick D.O.   On: 04/11/2021 14:45

## 2021-04-11 NOTE — Telephone Encounter (Signed)
-----   Message from Doreen Beam, [place] sent at 04/11/2021  3:26 PM EST ----- Addenda ADDENDUM REPORT: 04/11/2021 14:45 No DVT right leg  ADDENDUM: Addendum created to address labeled side  IMPRESSION: Directed duplex right lower extremity negative for DVT   Electronically Signed   By: Corrie Mckusick D.O.   On: 04/11/2021 14:45

## 2021-04-11 NOTE — Patient Instructions (Addendum)
Walk in x rays at [facility]. Labs today in our office. DVT ultrasound right leg at [facility] arrive at 12:45 today.  Emergency room at anytime if any symptoms worsen.    Edema Edema is when you have too much fluid in your body or under your skin. Edema may make your legs, feet, and ankles swell. Swelling often happens in looser tissues, such as around your eyes. This is a common condition. It gets more common as you get older. There are many possible causes of edema. These include: Eating too much salt (sodium). Being on your feet or sitting for a long time. Certain medical conditions, such as: Pregnancy. Heart failure. Liver disease. Kidney disease. Cancer. Hot weather may make edema worse. Edema is usually painless. Your skin may look swollen or shiny. Follow these instructions at home: Medicines Take over-the-counter and prescription medicines only as told by your doctor. Your doctor may prescribe a medicine to help your body get rid of extra water (diuretic). Take this medicine if you are told to take it. Eating and drinking Eat a low-salt (low-sodium) diet as told by your doctor. Sometimes, eating less salt may reduce swelling. Depending on the cause of your swelling, you may need to limit how much fluid you drink (fluid restriction). General instructions Raise the injured area above the level of your heart while you are sitting or lying down. Do not sit still or stand for a long time. Do not wear tight clothes. Do not wear garters on your upper legs. Exercise your legs. This can help the swelling go down. Wear compression stockings as told by your doctor. It is important that these are the right size. These should be prescribed by your doctor to prevent possible injuries. If elastic bandages or wraps are recommended, use them as told by your doctor. Contact a doctor if: Treatment is not working. You have heart, liver, or kidney disease and have  symptoms of edema. You have sudden and unexplained weight gain. Get help right away if: You have shortness of breath or chest pain. You cannot breathe when you lie down. You have pain, redness, or warmth in the swollen areas. You have heart, liver, or kidney disease and get edema all of a sudden. You have a fever and your symptoms get worse all of a sudden. These symptoms may be an emergency. Get help right away. Call 911. Do not wait to see if the symptoms will go away. Do not drive yourself to the hospital. Summary Edema is when you have too much fluid in your body or under your skin. Edema may make your legs, feet, and ankles swell. Swelling often happens in looser tissues, such as around your eyes. Raise the injured area above the level of your heart while you are sitting or lying down. Follow your doctor's instructions about diet and how much fluid you can drink. This information is not intended to replace advice given to you by your health care provider. Make sure you discuss any questions you have with your health care provider. Document Revised: 01/21/2021 Document Reviewed: 01/21/2021 Elsevier Patient Education  2022 [place]. Leg Cramps Leg cramps occur when one or more muscles tighten and a person has no control over it (involuntary muscle contraction). Muscle cramps are most common in the calf muscles of the leg. They can occur during exercise or at rest. Leg cramps are painful, and they may last for a few seconds to a few minutes. Cramps may  return several times before they finally stop. Usually, leg cramps are not caused by a serious medical problem. In many cases, the cause is not known. Some common causes include: Excessive physical effort (overexertion), such as during intense exercise. Doing the same motion over and over. Staying in a certain position for a long period of time. Improper preparation, form, or technique while doing a sport or an  activity. Dehydration. Injury. Side effects of certain medicines. Abnormally low levels of minerals in your blood (electrolytes), especially potassium and calcium. This could result from: Pregnancy. Taking diuretic medicines. Follow these instructions at home: Eating and drinking Drink enough fluid to keep your urine pale yellow. Staying hydrated may help prevent cramps. Eat a healthy diet that includes plenty of nutrients to help your muscles function. A healthy diet includes fruits and vegetables, lean protein, whole grains, and low-fat or nonfat dairy products. Managing pain, stiffness, and swelling   Try massaging, stretching, and relaxing the affected muscle. Do this for several minutes at a time. If directed, put ice on areas that are sore or painful after a cramp. To do this: Put ice in a plastic bag. Place a towel between your skin and the bag. Leave the ice on for 20 minutes, 2-3 times a day. Remove the ice if your skin turns bright red. This is very important. If you cannot feel pain, heat, or cold, you have a greater risk of damage to the area. If directed, apply heat to muscles that are tense or tight. Do this before you exercise, or as often as told by your health care provider. Use the heat source that your health care provider recommends, such as a moist heat pack or a heating pad. To do this: Place a towel between your skin and the heat source. Leave the heat on for 20-30 minutes. Remove the heat if your skin turns bright red. This is especially important if you are unable to feel pain, heat, or cold. You may have a greater risk of getting burned. Try taking hot showers or baths to help relax tight muscles. General instructions If you are having frequent leg cramps, avoid intense exercise for several days. Take over-the-counter and prescription medicines only as told by your health care provider. Keep all follow-up visits. This is important. Contact a health care provider  if: Your leg cramps get more severe or more frequent, or they do not improve over time. Your foot becomes cold, numb, or blue. Summary Muscle cramps can develop in any muscle, but the most common place is in the calf muscles of the leg. Leg cramps are painful, and they may last for a few seconds to a few minutes. Usually, leg cramps are not caused by a serious medical problem. Often, the cause is not known. Stay hydrated, and take over-the-counter and prescription medicines only as told by your health care provider. This information is not intended to replace advice given to you by your health care provider. Make sure you discuss any questions you have with your health care provider. Document Revised: 10/05/2019 Document Reviewed: 10/05/2019 Elsevier Patient Education  [place].

## 2021-04-12 ENCOUNTER — Telehealth: Payer: Self-pay

## 2021-04-12 NOTE — Telephone Encounter (Signed)
LMTCB for lab results.  

## 2021-04-14 ENCOUNTER — Encounter: Payer: Self-pay | Admitting: Adult Health

## 2021-04-15 ENCOUNTER — Encounter: Payer: Self-pay | Admitting: Adult Health

## 2021-04-15 DIAGNOSIS — W19XXXA Unspecified fall, initial encounter: Secondary | ICD-10-CM | POA: Insufficient documentation

## 2021-04-15 DIAGNOSIS — M25551 Pain in right hip: Secondary | ICD-10-CM | POA: Insufficient documentation

## 2021-04-15 DIAGNOSIS — D229 Melanocytic nevi, unspecified: Secondary | ICD-10-CM | POA: Insufficient documentation

## 2021-04-15 DIAGNOSIS — M25561 Pain in right knee: Secondary | ICD-10-CM | POA: Insufficient documentation

## 2021-04-15 DIAGNOSIS — M7989 Other specified soft tissue disorders: Secondary | ICD-10-CM | POA: Insufficient documentation

## 2021-04-15 NOTE — Telephone Encounter (Signed)
Pt returning call regarding lab results 

## 2021-04-15 NOTE — Telephone Encounter (Signed)
Pt returning call. Pt stated that NP Flinchum advise Pt that she may need to go to ED for IV Potassium or pick up medication for pharmacy. Pt stated that she would like to know what NP Flinchum prefer her to do. Pt would like callback.

## 2021-04-16 ENCOUNTER — Other Ambulatory Visit: Payer: Self-pay | Admitting: Adult Health

## 2021-04-16 ENCOUNTER — Telehealth: Payer: Self-pay

## 2021-04-16 DIAGNOSIS — E876 Hypokalemia: Secondary | ICD-10-CM

## 2021-04-16 MED ORDER — POTASSIUM CHLORIDE CRYS ER 10 MEQ PO TBCR: 10.0000 meq | EXTENDED_RELEASE_TABLET | Freq: Every day | ORAL | 0 refills | Status: DC

## 2021-04-16 NOTE — Telephone Encounter (Signed)
Placed orders for CMP for future labs

## 2021-04-16 NOTE — Telephone Encounter (Signed)
Placed call to pt. Pt is agreeable to potassium supplement. Pt lab appt scheduled and labs ordered. Pt asking can she get A1C drawn as well with CMP at her lab appt?

## 2021-04-16 NOTE — Telephone Encounter (Signed)
Pt calling back in regards to recent labs.  Pls give her a callback at [phone] to be advised.

## 2021-04-16 NOTE — Progress Notes (Signed)
Hypokalemia - Plan: potassium chloride (KLOR-CON) 10 MEQ tablet Orders Placed This Encounter  Procedures   Comprehensive metabolic panel    Standing Status:   Future    Standing Expiration Date:   04/16/2022

## 2021-04-17 NOTE — Telephone Encounter (Signed)
Pt notified yesterday of rx being sent in.

## 2021-04-18 ENCOUNTER — Ambulatory Visit: Payer: BC Managed Care – PPO | Admitting: Adult Health

## 2021-04-18 NOTE — Telephone Encounter (Signed)
Pt given lab results 

## 2021-04-19 ENCOUNTER — Other Ambulatory Visit (INDEPENDENT_AMBULATORY_CARE_PROVIDER_SITE_OTHER): Payer: BC Managed Care – PPO

## 2021-04-19 ENCOUNTER — Other Ambulatory Visit: Payer: Self-pay

## 2021-04-19 ENCOUNTER — Telehealth: Payer: Self-pay | Admitting: Adult Health

## 2021-04-19 DIAGNOSIS — E876 Hypokalemia: Secondary | ICD-10-CM | POA: Diagnosis not present

## 2021-04-19 DIAGNOSIS — R739 Hyperglycemia, unspecified: Secondary | ICD-10-CM | POA: Diagnosis not present

## 2021-04-19 LAB — COMPREHENSIVE METABOLIC PANEL
ALT: 20 U/L (ref 0–35)
AST: 22 U/L (ref 0–37)
Albumin: 4 g/dL (ref 3.5–5.2)
Alkaline Phosphatase: 90 U/L (ref 39–117)
BUN: 15 mg/dL (ref 6–23)
CO2: 28 mEq/L (ref 19–32)
Calcium: 9.1 mg/dL (ref 8.4–10.5)
Chloride: 100 mEq/L (ref 96–112)
Creatinine, Ser: 0.66 mg/dL (ref 0.40–1.20)
GFR: 100.55 mL/min (ref >60.00)
Glucose, Bld: 224 mg/dL — ABNORMAL HIGH (ref 70–99)
Potassium: 3.1 mEq/L — ABNORMAL LOW (ref 3.5–5.1)
Sodium: 139 mEq/L (ref 135–145)
Total Bilirubin: 0.4 mg/dL (ref 0.2–1.2)
Total Protein: 7 g/dL (ref 6.0–8.3)

## 2021-04-19 LAB — HEMOGLOBIN A1C: Hgb A1c MFr Bld: 6.8 % — ABNORMAL HIGH (ref 4.6–6.5)

## 2021-04-19 NOTE — Telephone Encounter (Signed)
Patient came into office with complaint that she had received a MY chart message advising her that she needed to go to the ER for IV potassium and work up on elevated WBC.  Patient stated she thought she should have received a phone call for anything requiring her to go to the hospital.  Patient was in office on 04/11/21 labs resulted at 8:59 PM same date. Patient was called on 04/12/21 by CMA for PCP and Message was left for patient to return call to office, patient stated she called back but no documentation in chart of return call until 04/15/21 at which time patient also sent My Chart message.I ask patient how we could resolve this issue she stated she would like a call in the future if she needs to go to the ED and not be advised by My chart. I advised patient I would discuss with involved parties and contact her back once PCP in office.

## 2021-04-21 ENCOUNTER — Encounter: Payer: Self-pay | Admitting: Adult Health

## 2021-04-21 ENCOUNTER — Telehealth (INDEPENDENT_AMBULATORY_CARE_PROVIDER_SITE_OTHER): Payer: BC Managed Care – PPO | Admitting: Adult Health

## 2021-04-21 DIAGNOSIS — E876 Hypokalemia: Secondary | ICD-10-CM

## 2021-04-21 NOTE — Progress Notes (Signed)
Given continued decreased in potassium,  see telephone note 04/21/21 she was advised ER for IV supplement for hypokalemia. She reported she has been taking potassium as directed. Will need office viist after ER to consider changing blood pressure medication. Has not had an establish care visit. Also discussed A1C diabetes and diet/ exercise and need for follow up appointment to address.

## 2021-04-21 NOTE — Telephone Encounter (Signed)
04/21/2021 645 pm  Patient was called as she has been on Potassium supplement for hypokalemia for the past over one week and potassium has dropped to 3.1.  Advised patient I would like her to go to the emergency room for IV potassium and further evaluation. She works at [employer] and agrees to go  to the [employer] for this.  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   Also discussed A1C is in diabetic range and she will need to follow exercise, and dietary changes as well as schedule a follow up visit for diabetes discussion and recheck on leg.   She has heard from dermatology for her atypical mole on leg and is waiting to hear from vascular for leg swelling. If she does not receive a call within the next week she will call office.   She was seen for acute viist for right leg swelling on 04/11/21, and needs to have establish care visit with this provider.   Patient verbalized understanding of all instructions given and denies any further questions at this time.She should schedule a follow up in office after she is seen in ER. Ok to give work note for her night shift 04/21/21.

## 2021-04-22 NOTE — Telephone Encounter (Signed)
Pt notified by provider

## 2021-04-22 NOTE — Telephone Encounter (Signed)
Called to follow up with patient  as discussed on 04/19/21 visit , patient has spoken with NP concerning low potassium and reports feeling much better.

## 2021-04-29 ENCOUNTER — Telehealth: Payer: Self-pay | Admitting: Adult Health

## 2021-04-29 NOTE — Telephone Encounter (Signed)
Rejection Reason - Patient did not respond - We have contacted this patient 2 times, left 2 messages and mailed a letter. This patient has not contacted [place] back to schedule. Referral is being closed due to time sensitivity but pt has our info to call and schedule. We will still be happy to assist your office and the patient. Thank you!" Virl Cagey said on Apr 29, 2021 9:09 AM  "Thank you for the referral. We have left a voicemail for this patient to contact our office to schedule an appointment. We will keep you updated as we can. Thank you" Virl Cagey said on Apr 16, 2021 11:06 AM  Msg from [facility]

## 2021-04-30 ENCOUNTER — Other Ambulatory Visit: Payer: Self-pay | Admitting: Adult Health

## 2021-04-30 ENCOUNTER — Encounter: Payer: Self-pay | Admitting: Adult Health

## 2021-04-30 ENCOUNTER — Ambulatory Visit: Payer: BC Managed Care – PPO | Admitting: Adult Health

## 2021-04-30 ENCOUNTER — Other Ambulatory Visit: Payer: Self-pay

## 2021-04-30 VITALS — BP 120/76 | HR 93 | Temp 98.0°F | Ht 61.0 in | Wt 242.4 lb

## 2021-04-30 DIAGNOSIS — R110 Nausea: Secondary | ICD-10-CM | POA: Diagnosis not present

## 2021-04-30 DIAGNOSIS — I10 Essential (primary) hypertension: Secondary | ICD-10-CM

## 2021-04-30 DIAGNOSIS — E559 Vitamin D deficiency, unspecified: Secondary | ICD-10-CM

## 2021-04-30 DIAGNOSIS — E119 Type 2 diabetes mellitus without complications: Secondary | ICD-10-CM

## 2021-04-30 DIAGNOSIS — E876 Hypokalemia: Secondary | ICD-10-CM

## 2021-04-30 DIAGNOSIS — Z114 Encounter for screening for human immunodeficiency virus [HIV]: Secondary | ICD-10-CM

## 2021-04-30 DIAGNOSIS — Z1231 Encounter for screening mammogram for malignant neoplasm of breast: Secondary | ICD-10-CM | POA: Diagnosis not present

## 2021-04-30 DIAGNOSIS — Z0000 Encounter for general adult medical examination without abnormal findings: Secondary | ICD-10-CM

## 2021-04-30 MED ORDER — METFORMIN HCL 500 MG PO TABS: 500.0000 mg | ORAL_TABLET | Freq: Two times a day (BID) | ORAL | 3 refills | Status: DC

## 2021-04-30 MED ORDER — ONDANSETRON HCL 4 MG PO TABS: 4.0000 mg | ORAL_TABLET | Freq: Three times a day (TID) | ORAL | 0 refills | Status: DC | PRN

## 2021-04-30 MED ORDER — BLOOD GLUCOSE METER KIT
PACK | 0 refills | Status: AC
Start: 2021-04-30 — End: ?

## 2021-04-30 NOTE — Progress Notes (Signed)
Established Patient Office Visit  Subjective:  Patient ID: Judy Garcia, female    DOB: 02-18-1971  Age: 50 y.o. MRN: [id number]  CC:  Chief Complaint  Patient presents with   Follow-up    HPI Judy Garcia presents for follow up for diabetes, found to have hemoglobin A1C of 6.8. Since she found out she was diabetic she started decreasing her sugar intake.She was drinking red bulls and has now gone to red bull sugar free. She has now gone to having water in the morning and lemons. She has had some fatigue in the past months and reports she feels better with this diet. She is decreasing carbohydrates. She is feeling " so much better".  She does desire to see a nutritionist.  She is in agreement to start metformin.  Right leg is improved, itches.  Dermatologist - seeing them end of January.   [facility] lab showed Potassium within normal limits, Maxzide was discontinued and she was started on losartan 25 mg, she did not discontinue the Maxide and wanted to talk to this provider before doing so.  She has not started the losartan as well. Referral to dermatology for atypical mole : appointment is scheduled for patient.  Refferal to vascular for rule out PAD/PVD. [place] was negative for DVT. History of chronic right leg edema.  Was referred to [facility] for right leg/ right hip pain :  YoungBlood, Rasheedah R routed conversation to You 23 hours ago (9:34 AM)   Marcelene [place], Rasheedah R 23 hours ago (9:33 AM)   RY Rejection Reason - Patient did not respond - We have contacted this patient 2 times, left 2 messages and mailed a letter. This patient has not contacted [place] back to schedule. Referral is being closed due to time sensitivity but pt has our info to call and schedule. We will still be happy to assist your office and the patient. Thank you!" Virl Cagey said on Apr 29, 2021 9:09 AM   "Thank you for the referral. We have left a voicemail for this patient to contact our office to  schedule an appointment. We will keep you updated as we can. Thank you" Virl Cagey said on Apr 16, 2021 11:06 AM   Msg from [facility]       Past Medical History:  Diagnosis Date   Alcoholism and drug addiction in family    Allergy    Back pain 08/09/2019   Chicken pox    COVID-19    11/2020   Depression    Eating disorder    GERD (gastroesophageal reflux disease)    History of lipoma 08/09/2019   Hypertension    Sleep disturbance 08/11/2019   Ulcer (traumatic) of oral mucosa    UTI (urinary tract infection)     Past Surgical History:  Procedure Laterality Date   HERNIA REPAIR     TONSILLECTOMY     TUBAL LIGATION     UTERINE FIBROID SURGERY      Family History  Problem Relation Age of Onset   Diabetes Mother    Cancer Maternal Grandmother     Social History   Socioeconomic History   Marital status: Married    Spouse name: Not on file   Number of children: Not on file   Years of education: Not on file   Highest education level: Not on file  Occupational History   Not on file  Tobacco Use   Smoking status: Every Day    Packs/day: 1.00  Years: 25.00    Pack years: 25.00    Types: Cigarettes   Smokeless tobacco: Never  Vaping Use   Vaping Use: Never used  Substance and Sexual Activity   Alcohol use: Never   Drug use: Never   Sexual activity: Yes    Birth control/protection: None  Other Topics Concern   Not on file  Social History Narrative   Not on file   Social Determinants of Health   Financial Resource Strain: Not on file  Food Insecurity: Not on file  Transportation Needs: Not on file  Physical Activity: Not on file  Stress: Not on file  Social Connections: Not on file  Intimate Partner Violence: Not on file    Outpatient Medications Prior to Visit  Medication Sig Dispense Refill   albuterol (VENTOLIN HFA) 108 (90 Base) MCG/ACT inhaler INHALE 1 TO 2 PUFFS INTO THE LUNGS EVERY 6 HOURS AS NEEDED FOR WHEEZING OR SHORTNESS OF BREATH  54 g 0   Ascorbic Acid (VITAMIN C) 100 MG tablet Take 100 mg by mouth as needed.     azelastine (ASTELIN) 0.1 % nasal spray Place into the nose.     docusate sodium (COLACE) 100 MG capsule Take 1 capsule (100 mg total) by mouth daily. 90 capsule 1   fluticasone furoate-vilanterol (BREO ELLIPTA) 100-25 MCG/INH AEPB Inhale 1 puff into the lungs daily. 60 each 5   Multiple Vitamin (MULTIVITAMIN) tablet Take 1 tablet by mouth daily.     PROAIR RESPICLICK 537 (90 Base) MCG/ACT AEPB INHALE 2 PUFFS INTO THE LUNGS FOUR TIMES DAILY AS NEEDED 1 each 0   venlafaxine XR (EFFEXOR-XR) 75 MG 24 hr capsule TAKE 3 CAPSULES(225 MG) BY MOUTH DAILY WITH BREAKFAST FOR 7 DAYS 90 capsule 2   losartan (COZAAR) 25 MG tablet Take 1 tablet by mouth daily. (Patient not taking: Reported on 04/30/2021)     azelastine (ASTELIN) 0.1 % nasal spray Place 2 sprays into both nostrils 2 (two) times daily. Use in each nostril as directed 30 mL 12   lisinopril (ZESTRIL) 20 MG tablet Take 20 mg by mouth daily.     potassium chloride (KLOR-CON) 10 MEQ tablet Take 1 tablet (10 mEq total) by mouth daily. (Patient not taking: Reported on 04/30/2021) 20 tablet 0   triamterene-hydrochlorothiazide (DYAZIDE) 37.5-25 MG capsule TAKE 2 CAPSULES BY MOUTH DAILY 180 capsule 1   No facility-administered medications prior to visit.    Allergies  Allergen Reactions   Lisinopril Swelling    ROS Review of Systems  Constitutional:  Positive for fatigue. Negative for activity change, appetite change, chills, diaphoresis, fever and unexpected weight change.  HENT: Negative.    Respiratory: Negative.    Cardiovascular: Negative.   Gastrointestinal: Negative.   Genitourinary: Negative.   Musculoskeletal:  Negative for arthralgias.  Neurological: Negative.   Hematological: Negative.   Psychiatric/Behavioral: Negative.       Objective:    Physical Exam Constitutional:      General: She is not in acute distress.    Appearance: She is obese.  She is not ill-appearing, toxic-appearing or diaphoretic.  HENT:     Head: Normocephalic and atraumatic.     Right Ear: External ear normal.     Left Ear: External ear normal.     Nose: Nose normal.     Mouth/Throat:     Pharynx: Oropharynx is clear.  Eyes:     Conjunctiva/sclera: Conjunctivae normal.  Cardiovascular:     Rate and Rhythm: Normal rate and regular rhythm.  Pulses: Normal pulses.     Heart sounds: Normal heart sounds.  Pulmonary:     Effort: Pulmonary effort is normal. No respiratory distress.     Breath sounds: No stridor. Rhonchi present. No wheezing or rales.  Chest:     Chest wall: No tenderness.  Abdominal:     Palpations: Abdomen is soft.  Musculoskeletal:        General: Swelling (mild swelling lower extremity - right - improved, has atypical mole posterior lower right leg as well, has dermatology scheduled. vascular refferal and pain in right hip and right leg , negative for DVT reffered to [facility] at last visit.) present. Normal range of motion.     Cervical back: Normal range of motion and neck supple.  Skin:    General: Skin is warm.     Coloration: Skin is not jaundiced.     Findings: No erythema or rash.  Neurological:     Mental Status: She is oriented to person, place, and time.  Psychiatric:        Mood and Affect: Mood normal.        Behavior: Behavior normal.        Thought Content: Thought content normal.        Judgment: Judgment normal.    BP 120/76 (BP Location: Left Arm, Patient Position: Sitting, Cuff Size: Large)   Pulse 93   Temp 98 F (36.7 C) (Oral)   Ht '5\' 1"'  (1.549 m)   Wt 242 lb 6.4 oz (110 kg)   SpO2 97%   BMI 45.80 kg/m  Wt Readings from Last 3 Encounters:  04/30/21 242 lb 6.4 oz (110 kg)  04/11/21 243 lb 12.8 oz (110.6 kg)  02/21/21 242 lb 3.2 oz (109.9 kg)   Vitals with BMI 04/30/2021 04/11/2021 02/21/2021  Height '5\' 1"'  5' 0.984" '5\' 1"'   Weight 242 lbs 6 oz 243 lbs 13 oz 242 lbs 3 oz  BMI 45.82 31.59  45.85  Systolic 929 244 628  Diastolic 76 88 80  Pulse 93 106 106     Health Maintenance Due  Topic Date Due   Hepatitis C Screening  Never done   COLONOSCOPY (Pts 45-21yr Insurance coverage will need to be confirmed)  Never done   Pneumococcal Vaccine 132626Years old (2 - PCV) 11/08/2016   MAMMOGRAM  Never done   Zoster Vaccines- Shingrix (1 of 2) Never done    There are no preventive care reminders to display for this patient.  Lab Results  Component Value Date   TSH 3.23 10/07/2018   Lab Results  Component Value Date   WBC 11.4 (H) 04/11/2021   HGB 13.6 04/11/2021   HCT 42.5 04/11/2021   MCV 84.8 04/11/2021   PLT 240.0 04/11/2021   Lab Results  Component Value Date   NA 139 04/19/2021   K 3.1 (L) 04/19/2021   CO2 28 04/19/2021   GLUCOSE 224 (H) 04/19/2021   BUN 15 04/19/2021   CREATININE 0.66 04/19/2021   BILITOT 0.4 04/19/2021   ALKPHOS 90 04/19/2021   AST 22 04/19/2021   ALT 20 04/19/2021   PROT 7.0 04/19/2021   ALBUMIN 4.0 04/19/2021   CALCIUM 9.1 04/19/2021   ANIONGAP 12 06/21/2020   GFR 100.55 04/19/2021   Lab Results  Component Value Date   CHOL 200 08/11/2019   Lab Results  Component Value Date   HDL 55.00 08/11/2019   Lab Results  Component Value Date   LDLCALC 121 (H) 08/11/2019  Lab Results  Component Value Date   TRIG 118.0 08/11/2019   Lab Results  Component Value Date   CHOLHDL 4 08/11/2019   Lab Results  Component Value Date   HGBA1C 6.8 (H) 04/19/2021      Assessment & Plan:   Problem List Items Addressed This Visit       Cardiovascular and Mediastinum   Hypertension   Relevant Medications   losartan (COZAAR) 25 MG tablet   Other Relevant Orders   Comprehensive metabolic panel   TSH   Lipid panel   Other Visit Diagnoses     Type 2 diabetes mellitus without complication, without long-term current use of insulin (HCC)    -  Primary   Relevant Medications   losartan (COZAAR) 25 MG tablet   metFORMIN  (GLUCOPHAGE) 500 MG tablet   blood glucose meter kit and supplies   Other Relevant Orders   Ambulatory referral to diabetic education   Comprehensive metabolic panel   Hemoglobin A1c   Nausea       Relevant Medications   ondansetron (ZOFRAN) 4 MG tablet   Screening mammogram for breast cancer       Relevant Orders   MM 3D SCREEN BREAST BILATERAL   Screening for HIV (human immunodeficiency virus)       Relevant Orders   HIV antibody (with reflex)   Hepatitis C Antibody   Vitamin D deficiency       Relevant Orders   VITAMIN D 25 Hydroxy (Vit-D Deficiency, Fractures)   Routine health maintenance       Relevant Orders   Ambulatory referral to Obstetrics / Gynecology       Meds ordered this encounter  Medications   metFORMIN (GLUCOPHAGE) 500 MG tablet    Sig: Take 1 tablet (500 mg total) by mouth 2 (two) times daily with a meal.    Dispense:  180 tablet    Refill:  3   blood glucose meter kit and supplies    Sig: Dispense based on patient and insurance preference. Use up to four times daily as directed. (FOR ICD-10 E10.9, E11.9).    Dispense:  1 each    Refill:  0    Order Specific Question:   Number of strips    Answer:   180    Order Specific Question:   Number of lancets    Answer:   180   ondansetron (ZOFRAN) 4 MG tablet    Sig: Take 1 tablet (4 mg total) by mouth every 8 (eight) hours as needed for nausea or vomiting.    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 Mammogram was ordered patient is advised to call for screening mammogram.  Discussed metformin and how to take, we will recheck an A1c in 1 month as well as her other labs that were needed. She is given prescription for glucose monitor and informed on how to use.  She will check fasting glucose in the a.m. and also before meals. Side effects of metformin were discussed thoroughly with patient patient is in agreement to start  metformin.  She has already started dietary changes that I helped her to feel better.  She is still smoking tobacco counseling was given she does not desire to quit at this time. Fasting lipid panel was also ordered.  She will have labs done before next visit follow-up 1 month.  Red Flags  discussed. The patient was given clear instructions to go to ER or return to medical center if any red flags develop, symptoms do not improve, worsen or new problems develop. They verbalized understanding.   Follow-up: Return in about 1 month (around 05/30/2021), or if symptoms worsen or fail to improve, for at any time for any worsening symptoms.    Marcille Buffy, FNP

## 2021-04-30 NOTE — Patient Instructions (Addendum)
Call to schedule your screening mammogram. Your orders have been placed for your exam.  Let our office know if you have questions, concerns, or any difficulty scheduling.  If normal results then yearly screening mammograms are recommended unless you notice  Changes in your breast then you should schedule a follow up office visit. If abnormal results  Further imaging will be warranted and sooner follow up as determined by the radiologist at the [facility].  [facility] [place], [place] [address]  Main: [phone]    Diabetes Mellitus and Nutrition, Adult When you have diabetes, or diabetes mellitus, it is very important to have healthy eating habits because your blood sugar (glucose) levels are greatly affected by what you eat and drink. Eating healthy foods in the right amounts, at about the same times every day, can help you: Manage your blood glucose. Lower your risk of heart disease. Improve your blood pressure. Reach or maintain a healthy weight. What can affect my meal plan? Every person with diabetes is different, and each person has different needs for a meal plan. Your health care provider may recommend that you work with a dietitian to make a meal plan that is best for you. Your meal plan may vary depending on factors such as: The calories you need. The medicines you take. Your weight. Your blood glucose, blood pressure, and cholesterol levels. Your activity level. Other health conditions you have, such as heart or kidney disease. How do carbohydrates affect me? Carbohydrates, also called carbs, affect your blood glucose level more than any other type of food. Eating carbs raises the amount of glucose in your blood. It is important to know how many carbs you can safely have in each meal. This is different for every person. Your dietitian can help you calculate how many carbs you should have at each meal and for each  snack. How does alcohol affect me? Alcohol can cause a decrease in blood glucose (hypoglycemia), especially if you use insulin or take certain diabetes medicines by mouth. Hypoglycemia can be a life-threatening condition. Symptoms of hypoglycemia, such as sleepiness, dizziness, and confusion, are similar to symptoms of having too much alcohol. Do not drink alcohol if: Your health care provider tells you not to drink. You are pregnant, may be pregnant, or are planning to become pregnant. If you drink alcohol: Limit how much you have to: 0-1 drink a day for women. 0-2 drinks a day for men. Know how much alcohol is in your drink. In the [place] one drink equals one 12 oz bottle of beer (355 mL), one 5 oz glass of wine (148 mL), or one 1 oz glass of hard liquor (44 mL). Keep yourself hydrated with water, diet soda, or unsweetened iced tea. Keep in mind that regular soda, juice, and other mixers may contain a lot of sugar and must be counted as carbs. What are tips for following this plan? Reading food labels Start by checking the serving size on the Nutrition Facts label of packaged foods and drinks. The number of calories and the amount of carbs, fats, and other nutrients listed on the label are based on one serving of the item. Many items contain more than one serving per package. Check the total grams (g) of carbs in one serving. Check the number of grams of saturated fats and trans fats in one serving. Choose foods that have a low amount or none of these fats. Check the number of  milligrams (mg) of salt (sodium) in one serving. Most people should limit total sodium intake to less than 2,300 mg per day. Always check the nutrition information of foods labeled as "low-fat" or "nonfat." These foods may be higher in added sugar or refined carbs and should be avoided. Talk to your dietitian to identify your daily goals for nutrients listed on the label. Shopping Avoid buying canned, pre-made, or  processed foods. These foods tend to be high in fat, sodium, and added sugar. Shop around the outside edge of the grocery store. This is where you will most often find fresh fruits and vegetables, bulk grains, fresh meats, and fresh dairy products. Cooking Use low-heat cooking methods, such as baking, instead of high-heat cooking methods, such as deep frying. Cook using healthy oils, such as olive, canola, or sunflower oil. Avoid cooking with butter, cream, or high-fat meats. Meal planning Eat meals and snacks regularly, preferably at the same times every day. Avoid going long periods of time without eating. Eat foods that are high in fiber, such as fresh fruits, vegetables, beans, and whole grains. Eat 4-6 oz (112-168 g) of lean protein each day, such as lean meat, chicken, fish, eggs, or tofu. One ounce (oz) (28 g) of lean protein is equal to: 1 oz (28 g) of meat, chicken, or fish. 1 egg.  cup (62 g) of tofu. Eat some foods each day that contain healthy fats, such as avocado, nuts, seeds, and fish. What foods should I eat? Fruits Berries. Apples. Oranges. Peaches. Apricots. Plums. Grapes. Mangoes. Papayas. Pomegranates. Kiwi. Cherries. Vegetables Leafy greens, including lettuce, spinach, kale, chard, collard greens, mustard greens, and cabbage. Beets. Cauliflower. Broccoli. Carrots. Green beans. Tomatoes. Peppers. Onions. Cucumbers. Brussels sprouts. Grains Whole grains, such as whole-wheat or whole-grain bread, crackers, tortillas, cereal, and pasta. Unsweetened oatmeal. Quinoa. Brown or wild rice. Meats and other proteins Seafood. Poultry without skin. Lean cuts of poultry and beef. Tofu. Nuts. Seeds. Dairy Low-fat or fat-free dairy products such as milk, yogurt, and cheese. The items listed above may not be a complete list of foods and beverages you can eat and drink. Contact a dietitian for more information. What foods should I avoid? Fruits Fruits canned with  syrup. Vegetables Canned vegetables. Frozen vegetables with butter or cream sauce. Grains Refined white flour and flour products such as bread, pasta, snack foods, and cereals. Avoid all processed foods. Meats and other proteins Fatty cuts of meat. Poultry with skin. Breaded or fried meats. Processed meat. Avoid saturated fats. Dairy Full-fat yogurt, cheese, or milk. Beverages Sweetened drinks, such as soda or iced tea. The items listed above may not be a complete list of foods and beverages you should avoid. Contact a dietitian for more information. Questions to ask a health care provider Do I need to meet with a certified diabetes care and education specialist? Do I need to meet with a dietitian? What number can I call if I have questions? When are the best times to check my blood glucose? Where to find more information: American Diabetes Association: diabetes.org Academy of Nutrition and Dietetics: eatright.[employer] of Diabetes and Digestive and Kidney Diseases: AmenCredit.is Association of Diabetes Care & Education Specialists: diabeteseducator.org Summary It is important to have healthy eating habits because your blood sugar (glucose) levels are greatly affected by what you eat and drink. It is important to use alcohol carefully. A healthy meal plan will help you manage your blood glucose and lower your risk of heart disease. Your health care  provider may recommend that you work with a dietitian to make a meal plan that is best for you. This information is not intended to replace advice given to you by your health care provider. Make sure you discuss any questions you have with your health care provider. Document Revised: 12/21/2019 Document Reviewed: 12/21/2019 Elsevier Patient Education  [employer]. Losartan Tablets What is this medication? LOSARTAN (loe SAR tan) treats high blood pressure. It may also be used to prevent a stroke in people with heart  disease and high blood pressure. It can be used to prevent kidney damage in people with diabetes. It works by relaxing the blood vessels, which helps decrease the amount of work your heart has to do. It belongs to a group of medications called ARBs. This medicine may be used for other purposes; ask your health care provider or pharmacist if you have questions. COMMON BRAND NAME(S): Cozaar What should I tell my care team before I take this medication? They need to know if you have any of these conditions: Heart failure Kidney disease Liver disease An unusual or allergic reaction to losartan, other medications, foods, dyes, or preservatives Pregnant or trying to get pregnant Breast-feeding How should I use this medication? Take this medication by mouth. Take it as directed on the prescription label at the same time every day. You can take it with or without food. If it upsets your stomach, take it with food. Keep taking it unless your care team tells you to stop. Talk to your care team about the use of this medication in children. While it may be prescribed for children as young as 6 for selected conditions, precautions do apply. Overdosage: If you think you have taken too much of this medicine contact a poison control center or emergency room at once. NOTE: This medicine is only for you. Do not share this medicine with others. What if I miss a dose? If you miss a dose, take it as soon as you can. If it is almost time for your next dose, take only that dose. Do not take double or extra doses. What may interact with this medication? Aliskiren ACE inhibitors, like enalapril or lisinopril Diuretics, especially amiloride, eplerenone, spironolactone, or triamterene Lithium NSAIDs, medications for pain and inflammation, like ibuprofen or naproxen Potassium salts or potassium supplements This list may not describe all possible interactions. Give your health care provider a list of all the medicines,  herbs, non-prescription drugs, or dietary supplements you use. Also tell them if you smoke, drink alcohol, or use illegal drugs. Some items may interact with your medicine. What should I watch for while using this medication? Visit your care team for regular check ups. Check your blood pressure as directed. Ask your care team what your blood pressure should be. Also, find out when you should contact them. Do not treat yourself for coughs, colds, or pain while you are using this medication without asking your care team for advice. Some medications may increase your blood pressure. Women should inform their care team if they wish to become pregnant or think they might be pregnant. There is a potential for serious side effects to an unborn child. Talk to your care team for more information. You may get drowsy or dizzy. Do not drive, use machinery, or do anything that needs mental alertness until you know how this medication affects you. Do not stand or sit up quickly, especially if you are an older patient. This reduces the risk of dizzy  or fainting spells. Alcohol can make you more drowsy and dizzy. Avoid alcoholic drinks. Avoid salt substitutes unless you are told otherwise by your care team. What side effects may I notice from receiving this medication? Side effects that you should report to your care team as soon as possible: Allergic reactions--skin rash, itching, hives, swelling of the face, lips, tongue, or throat High potassium level--muscle weakness, fast or irregular heartbeat Kidney injury--decrease in the amount of urine, swelling of the ankles, hands, or feet Low blood pressure--dizziness, feeling faint or lightheaded, blurry vision Side effects that usually do not require medical attention (report to your care team if they continue or are bothersome): Dizziness Headache Runny or stuffy nose This list may not describe all possible side effects. Call your doctor for medical advice about  side effects. You may report side effects to FDA at 1-800-FDA-1088. Where should I keep my medication? Keep out of the reach of children and pets. Store at room temperature between 20 and 25 degrees C (68 and 77 degrees F). Protect from light. Keep the container tightly closed. Get rid of any unused medication after the expiration date. To get rid of medications that are no longer needed or have expired: Take the medication to a medication take-back program. Check with your pharmacy or law enforcement to find a location. If you cannot return the medication, check the label or package insert to see if the medication should be thrown out in the garbage or flushed down the toilet. If you are not sure, ask your care team. If it is safe to put in the trash, empty the medication out of the container. Mix the medication with cat litter, dirt, coffee grounds, or other unwanted substance. Seal the mixture in a bag or container. Put it in the trash. NOTE: This sheet is a summary. It may not cover all possible information. If you have questions about this medicine, talk to your doctor, pharmacist, or health care provider.  2022 Elsevier/Gold Standard (2021-02-05 00:00:00) Metformin Tablets What is this medication? METFORMIN (met FOR min) treats type 2 diabetes. It controls blood sugar (glucose) and helps your body use insulin effectively. This medication is often combined with changes to diet and exercise. This medicine may be used for other purposes; ask your health care provider or pharmacist if you have questions. COMMON BRAND NAME(S): Glucophage What should I tell my care team before I take this medication? They need to know if you have any of these conditions: Anemia Dehydration Heart disease If you often drink alcohol Kidney disease Liver disease Polycystic ovary syndrome Serious infection or injury Vomiting An unusual or allergic reaction to metformin, other medications, foods, dyes, or  preservatives Pregnant or trying to get pregnant Breast-feeding How should I use this medication? Take this medication by mouth with a glass of water. Follow the directions on the prescription label. Take this medication with food. Take your medication at regular intervals. Do not take your medication more often than directed. Do not stop taking except on your care team's advice. Talk to your care team about the use of this medication in children. While this medication may be prescribed for children as young as 66 years of age for selected conditions, precautions do apply. Overdosage: If you think you have taken too much of this medicine contact a poison control center or emergency room at once. NOTE: This medicine is only for you. Do not share this medicine with others. What if I miss a dose? If you miss  a dose, take it as soon as you can. If it is almost time for your next dose, take only that dose. Do not take double or extra doses. What may interact with this medication? Do not take this medication with any of the following: Certain contrast medications given before X-rays, CT scans, MRI, or other procedures Dofetilide This medication may also interact with the following: Acetazolamide Alcohol Certain antivirals for HIV or hepatitis Certain medications for blood pressure, heart disease, irregular heart beat Cimetidine Dichlorphenamide Digoxin Diuretics Estrogens, progestins, or birth control pills Glycopyrrolate Isoniazid Lamotrigine Memantine Methazolamide Metoclopramide Midodrine Niacin Phenothiazines like chlorpromazine, mesoridazine, prochlorperazine, thioridazine Phenytoin Ranolazine Steroid medications like prednisone or cortisone Stimulant medications for attention disorders, weight loss, or to stay awake Thyroid medications Topiramate Trospium Vandetanib Zonisamide This list may not describe all possible interactions. Give your health care provider a list of all  the medicines, herbs, non-prescription drugs, or dietary supplements you use. Also tell them if you smoke, drink alcohol, or use illegal drugs. Some items may interact with your medicine. What should I watch for while using this medication? Visit your care team for regular checks on your progress. A test called the HbA1C (A1C) will be monitored. This is a simple blood test. It measures your blood sugar control over the last 2 to 3 months. You will receive this test every 3 to 6 months. Using this medication with insulin or a sulfonylurea may increase your risk of hypoglycemia. Learn how to check your blood sugar. Learn the symptoms of low and high blood sugar and how to manage them. Always carry a quick-source of sugar with you in case you have symptoms of low blood sugar. Examples include hard sugar candy or glucose tablets. Make sure others know that you can choke if you eat or drink when you develop serious symptoms of low blood sugar, such as seizures or unconsciousness. They must get medical help at once. Tell your care team if you have high blood sugar. You might need to change the dose of your medication. If you are sick or exercising more than usual, you might need to change the dose of your medication. Do not skip meals. Ask your care team if you should avoid alcohol. Many nonprescription cough and cold products contain sugar or alcohol. These can affect blood sugar. This medication may cause ovulation in premenopausal women who do not have regular monthly periods. This may increase your chances of becoming pregnant. You should not take this medication if you become pregnant or think you may be pregnant. Talk with your care team about your birth control options while taking this medication. Contact your care team right away if you think you are pregnant. If you are going to need surgery, an MRI, CT scan, or other procedure, tell your care team that you are taking this medication. You may need to  stop taking this medication before the procedure. Wear a medical ID bracelet or chain, and carry a card that describes your disease and details of your medication and dosage times. This medication may cause a decrease in folic acid and vitamin B12. You should make sure that you get enough vitamins while you are taking this medication. Discuss the foods you eat and the vitamins you take with your care team. What side effects may I notice from receiving this medication? Side effects that you should report to your care team as soon as possible: Allergic reactions--skin rash, itching, hives, swelling of the face, lips, tongue, or throat  High lactic acid level--muscle pain or cramps, stomach pain, trouble breathing, general discomfort or fatigue Low vitamin B12 level--pain, tingling, or numbness in the hands or feet, muscle weakness, dizziness, confusion, difficulty concentrating Side effects that usually do not require medical attention (report to your care team if they continue or are bothersome): Diarrhea Gas Headache Metallic taste in mouth Nausea This list may not describe all possible side effects. Call your doctor for medical advice about side effects. You may report side effects to FDA at 1-800-FDA-1088. Where should I keep my medication? Keep out of the reach of children and pets. Store at room temperature between 15 and 30 degrees C (59 and 86 degrees F). Protect from moisture and light. Get rid of any unused medication after the expiration date. To get rid of medications that are no longer needed or expired: Take the medication to a medication take-back program. Check with your pharmacy or law enforcement to find a location. If you cannot return the medication, check the label or package insert to see if the medication should be thrown out in the garbage or flushed down the toilet. If you are not sure, ask your care team. If it is safe to put in the trash, empty the medication out of the  container. Mix the medication with cat litter, dirt, coffee grounds, or other unwanted substance. Seal the mixture in a bag or container. Put it in the trash. NOTE: This sheet is a summary. It may not cover all possible information. If you have questions about this medicine, talk to your doctor, pharmacist, or health care provider.  2022 Elsevier/Gold Standard (2020-05-04 00:00:00)

## 2021-05-01 ENCOUNTER — Telehealth: Payer: Self-pay | Admitting: Pharmacist

## 2021-05-01 ENCOUNTER — Telehealth: Payer: Self-pay

## 2021-05-01 DIAGNOSIS — I10 Essential (primary) hypertension: Secondary | ICD-10-CM

## 2021-05-01 NOTE — Chronic Care Management (AMB) (Signed)
  Care Management   Note  [id number] Name: Anarosa Kubisiak MRN: [id number] DOB: [birth year]  Aideliz Garmany is a 50 y.o. year old female who is a primary care patient of Flinchum, Kelby Aline, FNP. I reached out to [name] by phone today in response to a referral sent by Ms. Laniya Stollings's primary care provider.   Ms. Bade was given information about care management services today including:  Care management services include personalized support from designated clinical staff supervised by her physician, including individualized plan of care and coordination with other care providers 24/7 contact phone numbers for assistance for urgent and routine care needs. The patient may stop care management services at any time by phone call to the office staff.  Patient agreed to services and verbal consent obtained.   Follow up plan: Telephone appointment with care management team member scheduled for:05/07/2021  Noreene Larsson, [place], [place], [place] [address] Direct Dial: [phone] [email or web address]

## 2021-05-01 NOTE — Telephone Encounter (Signed)
Patient reported to CCM scheduler that "something was wrong" with one of her medications at the pharmacy.   Called [facility] [place], spoke with Marya Amsler, PharmD. Appears they somehow received an order for both lisinopril and losartan.   Per review of provider's note yesterday, appears losartan was to be continued, but was added to her chart via outside medication reconcilliation.   Sharyn Lull, did you want her to continue losartan? If so, can you please send in a refill to [facility]?

## 2021-05-02 ENCOUNTER — Other Ambulatory Visit: Payer: Self-pay

## 2021-05-02 ENCOUNTER — Telehealth: Payer: Self-pay | Admitting: Adult Health

## 2021-05-02 MED ORDER — LOSARTAN POTASSIUM 25 MG PO TABS: 25.0000 mg | ORAL_TABLET | Freq: Every day | ORAL | 0 refills | Status: DC

## 2021-05-02 NOTE — Telephone Encounter (Signed)
LMTCB to ask patient what clarification was needed for blood glucose testing supplies.

## 2021-05-02 NOTE — Telephone Encounter (Signed)
Pt stated that she received all her glucose strip and monitor. She doesn't remember who called her but she just wanted everyone to know she received the items.

## 2021-05-02 NOTE — Telephone Encounter (Signed)
Pt called in stating that pharmacy advised her that they didn't receive a prescription for medication (losartan (COZAAR) 25 MG tablet). Pt is requesting for a prescription to be sent to her pharmacy. Pt requesting callback to confirm.

## 2021-05-02 NOTE — Addendum Note (Signed)
Addended by: De Hollingshead on: 05/02/2021 07:54 AM   Modules accepted: Orders

## 2021-05-02 NOTE — Telephone Encounter (Signed)
Refill sent under PCP.   I advised [facility] yesterday to add lisinopril as an allergy to her file.

## 2021-05-06 ENCOUNTER — Other Ambulatory Visit: Payer: Self-pay | Admitting: Adult Health

## 2021-05-06 ENCOUNTER — Encounter: Payer: Self-pay | Admitting: Adult Health

## 2021-05-06 DIAGNOSIS — I10 Essential (primary) hypertension: Secondary | ICD-10-CM

## 2021-05-06 MED ORDER — LOSARTAN POTASSIUM 25 MG PO TABS
25.0000 mg | ORAL_TABLET | Freq: Every day | ORAL | 1 refills | Status: DC
Start: 2021-05-06 — End: 2021-05-06

## 2021-05-06 MED ORDER — LOSARTAN POTASSIUM 25 MG PO TABS: 25.0000 mg | ORAL_TABLET | Freq: Every day | ORAL | 1 refills | Status: DC

## 2021-05-06 NOTE — Telephone Encounter (Signed)
Pt called in and advise her of previous note. Pt stated ok she will pickup.

## 2021-05-06 NOTE — Telephone Encounter (Signed)
Medication sent in to the [facility] in [place]. Got the following confirmation: E-Prescribing Status: Receipt confirmed by pharmacy (05/02/2021  7:54 AM EST).   Left message to return call. Okay to inform Patient if calling back in

## 2021-05-07 ENCOUNTER — Telehealth: Payer: Self-pay | Admitting: Pharmacist

## 2021-05-07 ENCOUNTER — Telehealth: Payer: BC Managed Care – PPO

## 2021-05-07 NOTE — Telephone Encounter (Signed)
  Chronic Care Management   Note  67/12/338 Name: Judy Garcia MRN: [id number] DOB: Jul 16, 1970   Attempted to contact patient for scheduled appointment for medication management support. Left HIPAA compliant message for patient to return my call at their convenience.    Plan: - If I do not hear back from the patient by end of business today, will collaborate with Care Guide to outreach to schedule follow up with me   Catie Darnelle Maffucci, PharmD, [place], [place] Pharmacist [employer] [facility] [employer] [phone]

## 2021-05-07 NOTE — Telephone Encounter (Signed)
Pt is requesting to speak with Arianna regarding prev message

## 2021-05-08 ENCOUNTER — Ambulatory Visit: Payer: BC Managed Care – PPO | Admitting: Dermatology

## 2021-05-13 NOTE — Telephone Encounter (Signed)
Pt has been r/s  

## 2021-05-17 ENCOUNTER — Encounter (INDEPENDENT_AMBULATORY_CARE_PROVIDER_SITE_OTHER): Payer: Self-pay | Admitting: Vascular Surgery

## 2021-05-21 ENCOUNTER — Telehealth: Payer: Self-pay | Admitting: Adult Health

## 2021-05-21 DIAGNOSIS — N644 Mastodynia: Secondary | ICD-10-CM

## 2021-05-21 NOTE — Telephone Encounter (Signed)
Pt called in stating that she try to schedule her appointment for mammogram at the [facility]. The provider at that office advise that she needs another referral for a diagnostic mammogram like Pt had before. Pt is request another referral to [facility] for diagnostic mammogram. Pt requesting callback to advise if referral was placed.

## 2021-05-21 NOTE — Telephone Encounter (Signed)
I do not have a previous mammogram in system.  Please call [facility] and see what they are requesting.

## 2021-05-30 ENCOUNTER — Encounter: Payer: Self-pay | Admitting: Adult Health

## 2021-05-30 ENCOUNTER — Ambulatory Visit (INDEPENDENT_AMBULATORY_CARE_PROVIDER_SITE_OTHER): Payer: BC Managed Care – PPO | Admitting: Adult Health

## 2021-05-30 ENCOUNTER — Other Ambulatory Visit: Payer: Self-pay

## 2021-05-30 VITALS — BP 132/78 | HR 89 | Ht 60.98 in | Wt 233.4 lb

## 2021-05-30 DIAGNOSIS — D171 Benign lipomatous neoplasm of skin and subcutaneous tissue of trunk: Secondary | ICD-10-CM

## 2021-05-30 DIAGNOSIS — R2241 Localized swelling, mass and lump, right lower limb: Secondary | ICD-10-CM

## 2021-05-30 DIAGNOSIS — E119 Type 2 diabetes mellitus without complications: Secondary | ICD-10-CM | POA: Diagnosis not present

## 2021-05-30 DIAGNOSIS — H9202 Otalgia, left ear: Secondary | ICD-10-CM

## 2021-05-30 DIAGNOSIS — H6502 Acute serous otitis media, left ear: Secondary | ICD-10-CM

## 2021-05-30 DIAGNOSIS — D229 Melanocytic nevi, unspecified: Secondary | ICD-10-CM

## 2021-05-30 MED ORDER — AMOXICILLIN 875 MG PO TABS: 875.0000 mg | ORAL_TABLET | Freq: Two times a day (BID) | ORAL | 0 refills | Status: DC

## 2021-05-30 MED ORDER — TRAMADOL HCL 50 MG PO TABS: 25.0000 mg | ORAL_TABLET | Freq: Every evening | ORAL | 0 refills | Status: AC

## 2021-05-30 NOTE — Progress Notes (Signed)
Established Patient Office Visit  Subjective:  Patient ID: Judy Garcia, female    DOB: 1970/09/08  Age: 50 y.o. MRN: [id number]  CC:  Chief Complaint  Patient presents with   Follow-up   Back Pain    HPI Judy Garcia presents for diabetes follow up and she has had lowest blood sugar at 99 fasting this morning. Denies any hypoglycemia event. 112-130's. Some of these readings are post prandial. She is taking metformin 500 mg BID.  She had initial GI upset with metformin but has now resolved.   She is still having pain with lipoma. She has seen Juanita Craver for surgical consult, suspects the area is lipoma. She can not have surgery right now she reports. She needs something to help with pain. Formatting of this note might be different from the original. 1. Excision of back soft tissue mass ([id number]) 2. Avoid taking aspirin 5 days before surgery 3. Contact us if you have any concern Electronically signed by Windell Moment, Michaelyn Barter, MD at 03/07/2021 11:50 AM EDT   Patient  denies any fever, body aches,chills, rash, chest pain, shortness of breath, nausea, vomiting, or diarrhea.    Past Medical History:  Diagnosis Date   Alcoholism and drug addiction in family    Allergy    Back pain 08/09/2019   Chicken pox    COVID-19    11/2020   Depression    Eating disorder    GERD (gastroesophageal reflux disease)    History of lipoma 08/09/2019   Hypertension    Sleep disturbance 08/11/2019   Ulcer (traumatic) of oral mucosa    UTI (urinary tract infection)     Past Surgical History:  Procedure Laterality Date   HERNIA REPAIR     TONSILLECTOMY     TUBAL LIGATION     UTERINE FIBROID SURGERY      Family History  Problem Relation Age of Onset   Diabetes Mother    Cancer Maternal Grandmother     Social History   Socioeconomic History   Marital status: Married    Spouse name: Not on file   Number of children: Not on file   Years of education: Not on file    Highest education level: Not on file  Occupational History   Not on file  Tobacco Use   Smoking status: Every Day    Packs/day: 1.00    Years: 25.00    Pack years: 25.00    Types: Cigarettes   Smokeless tobacco: Never  Vaping Use   Vaping Use: Never used  Substance and Sexual Activity   Alcohol use: Never   Drug use: Never   Sexual activity: Yes    Birth control/protection: None  Other Topics Concern   Not on file  Social History Narrative   Not on file   Social Determinants of Health   Financial Resource Strain: Not on file  Food Insecurity: Not on file  Transportation Needs: Not on file  Physical Activity: Not on file  Stress: Not on file  Social Connections: Not on file  Intimate Partner Violence: Not on file    Outpatient Medications Prior to Visit  Medication Sig Dispense Refill   albuterol (VENTOLIN HFA) 108 (90 Base) MCG/ACT inhaler INHALE 1 TO 2 PUFFS INTO THE LUNGS EVERY 6 HOURS AS NEEDED FOR WHEEZING OR SHORTNESS OF BREATH 54 g 0   Ascorbic Acid (VITAMIN C) 100 MG tablet Take 100 mg by mouth as needed.     azelastine (ASTELIN) 0.1 %  nasal spray Place into the nose.     blood glucose meter kit and supplies Dispense based on patient and insurance preference. Use up to four times daily as directed. (FOR ICD-10 E10.9, E11.9). 1 each 0   docusate sodium (COLACE) 100 MG capsule Take 1 capsule (100 mg total) by mouth daily. 90 capsule 1   fluticasone furoate-vilanterol (BREO ELLIPTA) 100-25 MCG/INH AEPB Inhale 1 puff into the lungs daily. 60 each 5   losartan (COZAAR) 25 MG tablet Take 1 tablet (25 mg total) by mouth daily. 90 tablet 1   metFORMIN (GLUCOPHAGE) 500 MG tablet Take 1 tablet (500 mg total) by mouth 2 (two) times daily with a meal. 180 tablet 3   Multiple Vitamin (MULTIVITAMIN) tablet Take 1 tablet by mouth daily.     ondansetron (ZOFRAN) 4 MG tablet Take 1 tablet (4 mg total) by mouth every 8 (eight) hours as needed for nausea or vomiting. 20 tablet 0    PROAIR RESPICLICK 920 (90 Base) MCG/ACT AEPB INHALE 2 PUFFS INTO THE LUNGS FOUR TIMES DAILY AS NEEDED 1 each 0   venlafaxine XR (EFFEXOR-XR) 75 MG 24 hr capsule TAKE 3 CAPSULES(225 MG) BY MOUTH DAILY WITH BREAKFAST FOR 7 DAYS 90 capsule 2   No facility-administered medications prior to visit.    Allergies  Allergen Reactions   Lisinopril Swelling    ROS Review of Systems  Constitutional: Negative.   HENT: Negative.    Eyes: Negative.   Respiratory: Negative.    Cardiovascular: Negative.   Gastrointestinal: Negative.   Genitourinary: Negative.   Musculoskeletal: Negative.   Neurological: Negative.   Hematological: Negative.   Psychiatric/Behavioral: Negative.       Objective:    Physical Exam Vitals reviewed.  Constitutional:      General: She is not in acute distress.    Appearance: She is well-developed. She is not diaphoretic.     Interventions: She is not intubated. HENT:     Head: Normocephalic and atraumatic.     Right Ear: External ear normal.     Left Ear: External ear normal.     Nose: Nose normal.     Mouth/Throat:     Pharynx: No oropharyngeal exudate.  Eyes:     General: Lids are normal. No scleral icterus.       Right eye: No discharge.        Left eye: No discharge.     Conjunctiva/sclera: Conjunctivae normal.     Right eye: Right conjunctiva is not injected. No exudate or hemorrhage.    Left eye: Left conjunctiva is not injected. No exudate or hemorrhage.    Pupils: Pupils are equal, round, and reactive to light.  Neck:     Thyroid: No thyroid mass or thyromegaly.     Vascular: Normal carotid pulses. No carotid bruit, hepatojugular reflux or JVD.     Trachea: Trachea and phonation normal. No tracheal tenderness or tracheal deviation.     Meningeal: Brudzinski's sign and Kernig's sign absent.  Cardiovascular:     Rate and Rhythm: Normal rate and regular rhythm.     Pulses: Normal pulses.          Radial pulses are 2+ on the right side and 2+ on  the left side.       Dorsalis pedis pulses are 2+ on the right side and 2+ on the left side.       Posterior tibial pulses are 2+ on the right side and 2+ on the left side.  Heart sounds: Normal heart sounds, S1 normal and S2 normal. Heart sounds not distant. No murmur heard.   No friction rub. No gallop.  Pulmonary:     Effort: Pulmonary effort is normal. No tachypnea, bradypnea, accessory muscle usage or respiratory distress. She is not intubated.     Breath sounds: Normal breath sounds. No stridor. No wheezing or rales.  Chest:     Chest wall: No tenderness.  Abdominal:     General: Bowel sounds are normal. There is no distension or abdominal bruit.     Palpations: Abdomen is soft. There is no shifting dullness, fluid wave, hepatomegaly, splenomegaly, mass or pulsatile mass.     Tenderness: There is no abdominal tenderness. There is no guarding or rebound.     Hernia: No hernia is present.  Musculoskeletal:        General: No tenderness or deformity. Normal range of motion.     Cervical back: Full passive range of motion without pain, normal range of motion and neck supple. No edema, erythema or rigidity. No spinous process tenderness or muscular tenderness. Normal range of motion.  Lymphadenopathy:     Head:     Right side of head: No submental, submandibular, tonsillar, preauricular, posterior auricular or occipital adenopathy.     Left side of head: No submental, submandibular, tonsillar, preauricular, posterior auricular or occipital adenopathy.     Cervical: No cervical adenopathy.     Right cervical: No superficial, deep or posterior cervical adenopathy.    Left cervical: No superficial, deep or posterior cervical adenopathy.     Upper Body:     Right upper body: No supraclavicular or pectoral adenopathy.     Left upper body: No supraclavicular or pectoral adenopathy.  Skin:    General: Skin is warm and dry.     Coloration: Skin is not pale.     Findings: No abrasion,  bruising, burn, ecchymosis, erythema, lesion, petechiae or rash.     Nails: There is no clubbing.  Neurological:     Mental Status: She is alert and oriented to person, place, and time.     GCS: GCS eye subscore is 4. GCS verbal subscore is 5. GCS motor subscore is 6.     Cranial Nerves: No cranial nerve deficit.     Sensory: No sensory deficit.     Motor: No tremor, atrophy, abnormal muscle tone or seizure activity.     Coordination: Coordination normal.     Gait: Gait normal.     Deep Tendon Reflexes: Reflexes are normal and symmetric. Reflexes normal. Babinski sign absent on the right side. Babinski sign absent on the left side.     Reflex Scores:      Tricep reflexes are 2+ on the right side and 2+ on the left side.      Bicep reflexes are 2+ on the right side and 2+ on the left side.      Brachioradialis reflexes are 2+ on the right side and 2+ on the left side.      Patellar reflexes are 2+ on the right side and 2+ on the left side.      Achilles reflexes are 2+ on the right side and 2+ on the left side. Psychiatric:        Speech: Speech normal.        Behavior: Behavior normal.        Thought Content: Thought content normal.        Judgment: Judgment normal.  BP 132/78    Pulse 89    Ht 5' 0.98" (1.549 m)    Wt 233 lb 6.4 oz (105.9 kg)    SpO2 95%    BMI 44.12 kg/m  Wt Readings from Last 3 Encounters:  05/30/21 233 lb 6.4 oz (105.9 kg)  04/30/21 242 lb 6.4 oz (110 kg)  04/11/21 243 lb 12.8 oz (110.6 kg)     Health Maintenance Due  Topic Date Due   Hepatitis C Screening  Never done   COLONOSCOPY (Pts 45-7yr Insurance coverage will need to be confirmed)  Never done   Pneumococcal Vaccine [id number] old (2 - PCV) 11/08/2016   MAMMOGRAM  Never done   Zoster Vaccines- Shingrix (1 of 2) Never done   COVID-19 Vaccine (3 - Booster for Moderna series) 01/05/2020    There are no preventive care reminders to display for this patient.  Lab Results  Component Value Date    TSH 3.23 10/07/2018   Lab Results  Component Value Date   WBC 11.4 (H) 04/11/2021   HGB 13.6 04/11/2021   HCT 42.5 04/11/2021   MCV 84.8 04/11/2021   PLT 240.0 04/11/2021   Lab Results  Component Value Date   NA 139 04/19/2021   K 3.1 (L) 04/19/2021   CO2 28 04/19/2021   GLUCOSE 224 (H) 04/19/2021   BUN 15 04/19/2021   CREATININE 0.66 04/19/2021   BILITOT 0.4 04/19/2021   ALKPHOS 90 04/19/2021   AST 22 04/19/2021   ALT 20 04/19/2021   PROT 7.0 04/19/2021   ALBUMIN 4.0 04/19/2021   CALCIUM 9.1 04/19/2021   ANIONGAP 12 06/21/2020   GFR 100.55 04/19/2021   Lab Results  Component Value Date   CHOL 200 08/11/2019   Lab Results  Component Value Date   HDL 55.00 08/11/2019   Lab Results  Component Value Date   LDLCALC 121 (H) 08/11/2019   Lab Results  Component Value Date   TRIG 118.0 08/11/2019   Lab Results  Component Value Date   CHOLHDL 4 08/11/2019   Lab Results  Component Value Date   HGBA1C 6.8 (H) 04/19/2021      Assessment & Plan:   Problem List Items Addressed This Visit       Other   Atypical mole   Other Visit Diagnoses     Diabetes mellitus without complication ([facility]    -  Primary   Relevant Orders   Urine Microalbumin w/creat. ratio   Comprehensive metabolic panel   CBC with Differential/Platelet   Lipoma of torso       Relevant Medications   traMADol (ULTRAM) 50 MG tablet   Localized swelling of right lower leg       Left ear pain       Non-recurrent acute serous otitis media of left ear       Relevant Medications   amoxicillin (AMOXIL) 875 MG tablet      Urgent to see dermatology was referred to dermatology . She is aware and she is to be seen ASAP right lower posterior lower leg. Benitiz GPhillip Heal  Meds ordered this encounter  Medications   traMADol (ULTRAM) 50 MG tablet    Sig: Take 0.5-1 tablets (25-50 mg total) by mouth at bedtime. Will cause drowsiness.    Dispense:  30 tablet    Refill:  0   amoxicillin (AMOXIL)  875 MG tablet    Sig: Take 1 tablet (875 mg total) by mouth 2 (two) times daily for 10 days.  Dispense:  20 tablet    Refill:  0   She is not taking labetalol, continued to take maxide. She needs to stop maxide and she is aware and was at last 3 visits and [place]. Red Flags discussed. The patient was given clear instructions to go to ER or return to medical center if any red flags develop, symptoms do not improve, worsen or new problems develop. They verbalized understanding.  Red Flags discussed. The patient was given clear instructions to go to ER or return to medical center if any red flags develop, symptoms do not improve, worsen or new problems develop. They verbalized understanding.  Follow-up: Return in about 2 months (around 07/30/2021), or if symptoms worsen or fail to improve, for at any time for any worsening symptoms, Go to Emergency room/ urgent care if worse.    Marcille Buffy, FNP

## 2021-05-30 NOTE — Patient Instructions (Addendum)
Schedule fasting labs follow up in 2 months. Stop Maxide start Losartan as previously discussed     Losartan Tablets What is this medication? LOSARTAN (loe SAR tan) treats high blood pressure. It may also be used to prevent a stroke in people with heart disease and high blood pressure. It can be used to prevent kidney damage in people with diabetes. It works by relaxing the blood vessels, which helps decrease the amount of work your heart has to do. It belongs to a group of medications called ARBs. This medicine may be used for other purposes; ask your health care provider or pharmacist if you have questions. COMMON BRAND NAME(S): Cozaar What should I tell my care team before I take this medication? They need to know if you have any of these conditions: Heart failure Kidney disease Liver disease An unusual or allergic reaction to losartan, other medications, foods, dyes, or preservatives Pregnant or trying to get pregnant Breast-feeding How should I use this medication? Take this medication by mouth. Take it as directed on the prescription label at the same time every day. You can take it with or without food. If it upsets your stomach, take it with food. Keep taking it unless your care team tells you to stop. Talk to your care team about the use of this medication in children. While it may be prescribed for children as young as 6 for selected conditions, precautions do apply. Overdosage: If you think you have taken too much of this medicine contact a poison control center or emergency room at once. NOTE: This medicine is only for you. Do not share this medicine with others. What if I miss a dose? If you miss a dose, take it as soon as you can. If it is almost time for your next dose, take only that dose. Do not take double or extra doses. What may interact with this medication? Aliskiren ACE inhibitors, like enalapril or lisinopril Diuretics, especially amiloride, eplerenone,  spironolactone, or triamterene Lithium NSAIDs, medications for pain and inflammation, like ibuprofen or naproxen Potassium salts or potassium supplements This list may not describe all possible interactions. Give your health care provider a list of all the medicines, herbs, non-prescription drugs, or dietary supplements you use. Also tell them if you smoke, drink alcohol, or use illegal drugs. Some items may interact with your medicine. What should I watch for while using this medication? Visit your care team for regular check ups. Check your blood pressure as directed. Ask your care team what your blood pressure should be. Also, find out when you should contact them. Do not treat yourself for coughs, colds, or pain while you are using this medication without asking your care team for advice. Some medications may increase your blood pressure. Women should inform their care team if they wish to become pregnant or think they might be pregnant. There is a potential for serious side effects to an unborn child. Talk to your care team for more information. You may get drowsy or dizzy. Do not drive, use machinery, or do anything that needs mental alertness until you know how this medication affects you. Do not stand or sit up quickly, especially if you are an older patient. This reduces the risk of dizzy or fainting spells. Alcohol can make you more drowsy and dizzy. Avoid alcoholic drinks. Avoid salt substitutes unless you are told otherwise by your care team. What side effects may I notice from receiving this medication? Side effects that you should report to  your care team as soon as possible: Allergic reactions--skin rash, itching, hives, swelling of the face, lips, tongue, or throat High potassium level--muscle weakness, fast or irregular heartbeat Kidney injury--decrease in the amount of urine, swelling of the ankles, hands, or feet Low blood pressure--dizziness, feeling faint or lightheaded, blurry  vision Side effects that usually do not require medical attention (report to your care team if they continue or are bothersome): Dizziness Headache Runny or stuffy nose This list may not describe all possible side effects. Call your doctor for medical advice about side effects. You may report side effects to FDA at 1-800-FDA-1088. Where should I keep my medication? Keep out of the reach of children and pets. Store at room temperature between 20 and 25 degrees C (68 and 77 degrees F). Protect from light. Keep the container tightly closed. Get rid of any unused medication after the expiration date. To get rid of medications that are no longer needed or have expired: Take the medication to a medication take-back program. Check with your pharmacy or law enforcement to find a location. If you cannot return the medication, check the label or package insert to see if the medication should be thrown out in the garbage or flushed down the toilet. If you are not sure, ask your care team. If it is safe to put in the trash, empty the medication out of the container. Mix the medication with cat litter, dirt, coffee grounds, or other unwanted substance. Seal the mixture in a bag or container. Put it in the trash. NOTE: This sheet is a summary. It may not cover all possible information. If you have questions about this medicine, talk to your doctor, pharmacist, or health care provider.  2022 Elsevier/Gold Standard (2021-02-05 00:00:00) Tramadol; Acetaminophen Tablets What is this medication? TRAMADOL; ACETAMINOPHEN (TRA ma dol; ea set a MEE noe fen) treats severe pain. It is prescribed when other pain medications have not worked or cannot be tolerated. It works by blocking pain signals in the brain. This medication is a combination of acetaminophen and an opioid. This medicine may be used for other purposes; ask your health care provider or pharmacist if you have questions. COMMON BRAND NAME(S): Ultracet What  should I tell my care team before I take this medication? They need to know if you have any of these conditions: Brain tumor Frequently drink alcohol Head injury Heart disease Kidney disease or trouble passing urine Low adrenal gland function Lung disease, asthma, or breathing problems Seizures Stomach or intestine problems Substance use disorder Taken an MAOI like Marplan, Nardil, or Parnate in the last 14 days An unusual or allergic reaction to tramadol, acetaminophen, other medications, foods, dyes, or preservatives Pregnant or trying to get pregnant Breast-feeding How should I use this medication? Take this medication by mouth with a full glass of water. Follow the directions on the prescription label. If the medication upsets your stomach, take it with food or milk. Do not take your medication more often than directed. A special MedGuide will be given to you by the pharmacist with each prescription and refill. Be sure to read this information carefully each time. Talk to your care team about the use of this medication in children. Special care may be needed. This medication is not for use in children younger than 58 years of age. Do not give this medication to a child younger than 36 years of age after surgery to remove the tonsils and/or adenoids. Overdosage: If you think you have taken too  much of this medicine contact a poison control center or emergency room at once. NOTE: This medicine is only for you. Do not share this medicine with others. What if I miss a dose? If you miss a dose, take it as soon as you can. If it is almost time for your next dose, take only that dose. Do not take double or extra doses. What may interact with this medication? Do not take this medication with any of the following: Linezolid MAOIs like Marplan, Nardil, and Parnate Methylene blue Ozanimod Samidorphan This medication may also interact with the following: Alcohol Antihistamines for allergy,  cough, and cold Atropine Certain antibiotics like erythromycin, clarithromycin, rifampin Certain antivirals for HIV or hepatitis Certain medications for anxiety or sleep Certain medications for bladder problems like oxybutynin, tolterodine Certain medications for depression like amitriptyline, bupropion, fluoxetine, paroxetine, sertraline Certain medications for fungal infections like ketoconazole, itraconazole, or posaconazole Certain medications for migraine headache like almotriptan, eletriptan, frovatriptan, naratriptan, rizatriptan, sumatriptan, zolmitriptan Certain medications for Parkinson disease like benztropine, trihexyphenidyl Certain medications for seizures like carbamazepine, phenobarbital, phenytoin, primidone Certain medications for stomach problems like dicyclomine, hyoscyamine Certain medications for travel sickness like scopolamine Digoxin Diuretics General anesthetics like halothane, isoflurane, methoxyflurane, propofol Ipratropium Medications that relax muscles for surgery Other medications with acetaminophen Other opioid medications for pain Phenothiazines like chlorpromazine, mesoridazine, prochlorperazine, thioridazine Quinidine Warfarin This list may not describe all possible interactions. Give your health care provider a list of all the medicines, herbs, non-prescription drugs, or dietary supplements you use. Also tell them if you smoke, drink alcohol, or use illegal drugs. Some items may interact with your medicine. What should I watch for while using this medication? Tell your care team if your pain does not go away, if it gets worse, or if you have new or a different type of pain. You may develop tolerance to this medication. Tolerance means that you will need a higher dose of the medication for pain relief. Tolerance is normal and is expected if you take this medication for a long time. Do not suddenly stop taking your medication because you may develop a  severe reaction. Your body becomes used to the medication. This does NOT mean you are addicted. Addiction is a behavior related to getting and using a medication for a nonmedical reason. If you have pain, you have a medical reason to take pain medication. Your care team will tell you how much medication to take. If your care team wants you to stop the medication, the dose will be slowly lowered over time to avoid any side effects. There are different types of medications for pain. If you take more than one type at the same time, you may have more side effects. Give your care team a list of all medications you use. They will tell you how much medication to take. Do not take more medication than directed. Call emergency services if you have problems breathing. Naloxone is an emergency medication used for an opioid overdose. An overdose can happen if you take too much opioid. It can also happen if an opioid is taken with some other medications or substances such as alcohol. Know the symptoms of an overdose, like trouble breathing, unusually tired or sleepy, or not being able to respond or wake up. Make sure to tell caregivers and close contacts where your naloxone stored. Make sure they know how to use it. After naloxone is given, the person giving it must call emergency services. Naloxone is a temporary treatment.  Repeat doses may be needed. Do not take other medications that contain acetaminophen with this medication. Many non-prescription medications contain acetaminophen. Always read labels carefully. If you have questions, ask your care team. If you take too much acetaminophen, get medical help right away. Too much acetaminophen can be very dangerous and cause liver damage. Even if you do not have symptoms, it is important to get help right away. This medication may cause serious skin reactions. They can happen weeks to months after starting the medication. Contact your care team right away if you notice  fevers or flu-like symptoms with a rash. The rash may be red or purple and then turn into blisters or peeling of the skin. Or, you might notice a red rash with swelling of the face, lips or lymph nodes in your neck or under your arms. This medication may affect your coordination, reaction time, or judgment. Do not drive or operate machinery until you know how this medication affects you. Sit up or stand slowly to reduce the risk of dizzy or fainting spells. Drinking alcohol with this medication can increase the risk of these side effects. This medication will cause constipation. If you do not have a bowel movement for 3 days, call your care team. Your mouth may get dry. Chewing sugarless gum or sucking hard candy and drinking plenty of water may help. Contact your care team if the problem does not go away or is severe. What side effects may I notice from receiving this medication? Side effects that you should report to your care team as soon as possible: Allergic reactions--skin rash, itching, hives, swelling of the face, lips, tongue, or throat CNS depression--slow or shallow breathing, shortness of breath, feeling faint, dizziness, confusion, trouble staying awake Liver injury--right upper belly pain, loss of appetite, nausea, light-colored stool, dark yellow or brown urine, yellowing skin or eyes, unusual weakness or fatigue Low adrenal gland function--nausea, vomiting, loss of appetite, unusual weakness or fatigue, dizziness Low blood pressure--dizziness, feeling faint or lightheaded, blurry vision Low blood sugar (hypoglycemia)--tremors or shaking, anxiety, sweating, cold or clammy skin, confusion, dizziness, rapid heartbeat Low sodium level--muscle weakness, fatigue, dizziness, headache, confusion Redness, blistering, peeling, or loosening of the skin, including inside the mouth Seizures Side effects that usually do not require medical attention (report to your care team if they continue or are  bothersome): Constipation Dizziness Drowsiness Dry mouth Headache Nausea Trouble sleeping Upset stomach Vomiting This list may not describe all possible side effects. Call your doctor for medical advice about side effects. You may report side effects to FDA at 1-800-FDA-1088. Where should I keep my medication? Keep out of the reach of children and pets. This medication can be abused. Keep it in a safe place to protect it from theft. Do not share it with anyone. It is only for you. Selling or giving away this medication is dangerous and against the law. Store at room temperature between 20 and 25 degrees C (68 and 77 degrees F). Get rid of any unused medication after the expiration date. This medication may cause harm and death if it is taken by other adults, children, or pets. It is important to get rid of the medication as soon as you no longer need it or it is expired. You can do this in two ways: Take the medication to a medication take-back program. Check with your pharmacy or law enforcement to find a location. If you cannot return the medication, check the label or package insert to see  if the medication should be thrown out in the garbage or flushed down the toilet. If you are not sure, ask your care team. If it is safe to put it in the trash, take the medication out of the container. Mix the medication with cat litter, dirt, coffee grounds, or other unwanted substance. Seal the mixture in a bag or container. Put it in the trash. NOTE: This sheet is a summary. It may not cover all possible information. If you have questions about this medicine, talk to your doctor, pharmacist, or health care provider.  2022 Elsevier/Gold Standard (2020-12-12 00:00:00)    Diabetes Mellitus and Nutrition, Adult When you have diabetes, or diabetes mellitus, it is very important to have healthy eating habits because your blood sugar (glucose) levels are greatly affected by what you eat and drink. Eating  healthy foods in the right amounts, at about the same times every day, can help you: Manage your blood glucose. Lower your risk of heart disease. Improve your blood pressure. Reach or maintain a healthy weight. What can affect my meal plan? Every person with diabetes is different, and each person has different needs for a meal plan. Your health care provider may recommend that you work with a dietitian to make a meal plan that is best for you. Your meal plan may vary depending on factors such as: The calories you need. The medicines you take. Your weight. Your blood glucose, blood pressure, and cholesterol levels. Your activity level. Other health conditions you have, such as heart or kidney disease. How do carbohydrates affect me? Carbohydrates, also called carbs, affect your blood glucose level more than any other type of food. Eating carbs raises the amount of glucose in your blood. It is important to know how many carbs you can safely have in each meal. This is different for every person. Your dietitian can help you calculate how many carbs you should have at each meal and for each snack. How does alcohol affect me? Alcohol can cause a decrease in blood glucose (hypoglycemia), especially if you use insulin or take certain diabetes medicines by mouth. Hypoglycemia can be a life-threatening condition. Symptoms of hypoglycemia, such as sleepiness, dizziness, and confusion, are similar to symptoms of having too much alcohol. Do not drink alcohol if: Your health care provider tells you not to drink. You are pregnant, may be pregnant, or are planning to become pregnant. If you drink alcohol: Limit how much you have to: 0-1 drink a day for women. 0-2 drinks a day for men. Know how much alcohol is in your drink. In the [place] one drink equals one 12 oz bottle of beer (355 mL), one 5 oz glass of wine (148 mL), or one 1 oz glass of hard liquor (44 mL). Keep yourself hydrated with water, diet  soda, or unsweetened iced tea. Keep in mind that regular soda, juice, and other mixers may contain a lot of sugar and must be counted as carbs. What are tips for following this plan? Reading food labels Start by checking the serving size on the Nutrition Facts label of packaged foods and drinks. The number of calories and the amount of carbs, fats, and other nutrients listed on the label are based on one serving of the item. Many items contain more than one serving per package. Check the total grams (g) of carbs in one serving. Check the number of grams of saturated fats and trans fats in one serving. Choose foods that have a low amount or  none of these fats. Check the number of milligrams (mg) of salt (sodium) in one serving. Most people should limit total sodium intake to less than 2,300 mg per day. Always check the nutrition information of foods labeled as "low-fat" or "nonfat." These foods may be higher in added sugar or refined carbs and should be avoided. Talk to your dietitian to identify your daily goals for nutrients listed on the label. Shopping Avoid buying canned, pre-made, or processed foods. These foods tend to be high in fat, sodium, and added sugar. Shop around the outside edge of the grocery store. This is where you will most often find fresh fruits and vegetables, bulk grains, fresh meats, and fresh dairy products. Cooking Use low-heat cooking methods, such as baking, instead of high-heat cooking methods, such as deep frying. Cook using healthy oils, such as olive, canola, or sunflower oil. Avoid cooking with butter, cream, or high-fat meats. Meal planning Eat meals and snacks regularly, preferably at the same times every day. Avoid going long periods of time without eating. Eat foods that are high in fiber, such as fresh fruits, vegetables, beans, and whole grains. Eat 4-6 oz (112-168 g) of lean protein each day, such as lean meat, chicken, fish, eggs, or tofu. One ounce (oz)  (28 g) of lean protein is equal to: 1 oz (28 g) of meat, chicken, or fish. 1 egg.  cup (62 g) of tofu. Eat some foods each day that contain healthy fats, such as avocado, nuts, seeds, and fish. What foods should I eat? Fruits Berries. Apples. Oranges. Peaches. Apricots. Plums. Grapes. Mangoes. Papayas. Pomegranates. Kiwi. Cherries. Vegetables Leafy greens, including lettuce, spinach, kale, chard, collard greens, mustard greens, and cabbage. Beets. Cauliflower. Broccoli. Carrots. Green beans. Tomatoes. Peppers. Onions. Cucumbers. Brussels sprouts. Grains Whole grains, such as whole-wheat or whole-grain bread, crackers, tortillas, cereal, and pasta. Unsweetened oatmeal. Quinoa. Brown or wild rice. Meats and other proteins Seafood. Poultry without skin. Lean cuts of poultry and beef. Tofu. Nuts. Seeds. Dairy Low-fat or fat-free dairy products such as milk, yogurt, and cheese. The items listed above may not be a complete list of foods and beverages you can eat and drink. Contact a dietitian for more information. What foods should I avoid? Fruits Fruits canned with syrup. Vegetables Canned vegetables. Frozen vegetables with butter or cream sauce. Grains Refined white flour and flour products such as bread, pasta, snack foods, and cereals. Avoid all processed foods. Meats and other proteins Fatty cuts of meat. Poultry with skin. Breaded or fried meats. Processed meat. Avoid saturated fats. Dairy Full-fat yogurt, cheese, or milk. Beverages Sweetened drinks, such as soda or iced tea. The items listed above may not be a complete list of foods and beverages you should avoid. Contact a dietitian for more information. Questions to ask a health care provider Do I need to meet with a certified diabetes care and education specialist? Do I need to meet with a dietitian? What number can I call if I have questions? When are the best times to check my blood glucose? Where to find more  information: American Diabetes Association: diabetes.org Academy of Nutrition and Dietetics: eatright.[employer] of Diabetes and Digestive and Kidney Diseases: [employer] Association of Diabetes Care & Education Specialists: diabeteseducator.org Summary It is important to have healthy eating habits because your blood sugar (glucose) levels are greatly affected by what you eat and drink. It is important to use alcohol carefully. A healthy meal plan will help you manage your blood glucose and lower  your risk of heart disease. Your health care provider may recommend that you work with a dietitian to make a meal plan that is best for you. This information is not intended to replace advice given to you by your health care provider. Make sure you discuss any questions you have with your health care provider. Document Revised: 12/21/2019 Document Reviewed: 12/21/2019 Elsevier Patient Education  [place]. Diabetes Mellitus and Exercise Exercising regularly is important for overall health, especially for people who have diabetes mellitus. Exercising is not only about losing weight. It has many other health benefits, such as increasing muscle strength and bone density and reducing body fat and stress. This leads to improved fitness, flexibility, and endurance, all of which result in better overall health. What are the benefits of exercise if I have diabetes? Exercise has many benefits for people with diabetes. They include: Helping to lower and control blood sugar (glucose). Helping the body to respond better to the hormone insulin by improving insulin sensitivity. Reducing how much insulin the body needs. Lowering the risk for heart disease by: Lowering "bad" cholesterol and triglyceride levels. Increasing "good" cholesterol levels. Lowering blood pressure. Lowering blood glucose levels. What is my activity plan? Your health care provider or certified diabetes educator can  help you make a plan for the type and frequency of exercise that works for you. This is called your activity plan. Be sure to: Get at least 150 minutes of medium-intensity or high-intensity exercise each week. Exercises may include brisk walking, biking, or water aerobics. Do stretching and strengthening exercises, such as yoga or weight lifting, at least 2 times a week. Spread out your activity over at least 3 days of the week. Get some form of physical activity each day. Do not go more than 2 days in a row without some kind of physical activity. Avoid being inactive for more than 90 minutes at a time. Take frequent breaks to walk or stretch. Choose exercises or activities that you enjoy. Set realistic goals. Start slowly and gradually increase your exercise intensity over time. How do I manage my diabetes during exercise? Monitor your blood glucose Check your blood glucose before and after exercising. If your blood glucose is: 240 mg/dL (13.3 mmol/L) or higher before you exercise, check your urine for ketones. These are chemicals created by the liver. If you have ketones in your urine, do not exercise until your blood glucose returns to normal. 100 mg/dL (5.6 mmol/L) or lower, eat a snack containing 15-20 grams of carbohydrate. Check your blood glucose 15 minutes after the snack to make sure that your glucose level is above 100 mg/dL (5.6 mmol/L) before you start your exercise. Know the symptoms of low blood glucose (hypoglycemia) and how to treat it. Your risk for hypoglycemia increases during and after exercise. Follow these tips and your health care provider's instructions Keep a carbohydrate snack that is fast-acting for use before, during, and after exercise to help prevent or treat hypoglycemia. Avoid injecting insulin into areas of the body that are going to be exercised. For example, avoid injecting insulin into: Your arms, when you are about to play tennis. Your legs, when you are about  to go jogging. Keep records of your exercise habits. Doing this can help you and your health care provider adjust your diabetes management plan as needed. Write down: Food that you eat before and after you exercise. Blood glucose levels before and after you exercise. The type and amount of exercise you have done.  Work with your health care provider when you start a new exercise or activity. He or she may need to: Make sure that the activity is safe for you. Adjust your insulin, other medicines, and food that you eat. Drink plenty of water while you exercise. This prevents loss of water (dehydration) and problems caused by a lot of heat in the body (heat stroke). Where to find more information American Diabetes Association: www.diabetes.org Summary Exercising regularly is important for overall health, especially for people who have diabetes mellitus. Exercising has many health benefits. It increases muscle strength and bone density and reduces body fat and stress. It also lowers and controls blood glucose. Your health care provider or certified diabetes educator can help you make an activity plan for the type and frequency of exercise that works for you. Work with your health care provider to make sure any new activity is safe for you. Also work with your health care provider to adjust your insulin, other medicines, and the food you eat. This information is not intended to replace advice given to you by your health care provider. Make sure you discuss any questions you have with your health care provider. Document Revised: 02/14/2019 Document Reviewed: 02/14/2019 Elsevier Patient Education  [place].  Ibuprofen Capsules or Tablets What is this medication? IBUPROFEN (eye BYOO proe fen) treats mild to moderate pain, inflammation, or arthritis. It may also be used to reduce fever. It belongs to a group of medications called NSAIDs. This medicine may be used for other purposes; ask your  health care provider or pharmacist if you have questions. COMMON BRAND NAME(S): Advil, Advil Junior Strength, Advil Migraine, Genpril, Ibren, IBU, Ibupak, Midol, Midol Cramps and Body Aches, Motrin, Motrin IB, Motrin Junior Strength, Motrin Migraine Pain, Samson-8, Toxicology Saliva Collection What should I tell my care team before I take this medication? They need to know if you have any of these conditions: Bleeding disorder Coronary artery bypass graft (CABG) within the past 2 weeks Dehydration Diarrhea Heart attack Heart disease Heart failure High blood pressure If you often drink alcohol Kidney disease Liver disease Lung or breathing disease (asthma) Receiving steroids such as dexamethasone or prednisone Smoke tobacco cigarettes Stomach bleeding Stomach ulcers, other stomach or intestine problems Stroke Take medications that treat or prevent blood clots Vomiting An unusual or allergic reaction to ibuprofen, other medications, foods, dyes, or preservatives Pregnant or trying to get pregnant Breast-feeding How should I use this medication? Take this medication by mouth. Take it as directed on the label. You can take it with or without food. If it upsets your stomach, take it with food. Talk to your care team about the use of this medication in children. While it may be prescribed for children as young as 12 for selected conditions, precautions do apply. Patients over 102 years of age may have a stronger reaction and need a smaller dose. If you get this medication as a prescription, a special MedGuide will be given to you by the pharmacist with each prescription and refill. Be sure to read this information carefully each time. Overdosage: If you think you have taken too much of this medicine contact a poison control center or emergency room at once. NOTE: This medicine is only for you. Do not share this medicine with others. What if I miss a dose? If you take this medication on a  regular basis, take it as soon as you can. If it is almost time for your next dose, take only  that dose. Do not take double or extra doses. What may interact with this medication? Do not take this medication with any of the following: Cidofovir Ketorolac Methotrexate Pemetrexed This medication may also interact with the following: Alcohol Aspirin Diuretics Lithium Other medications for inflammation such as prednisone Warfarin This list may not describe all possible interactions. Give your health care provider a list of all the medicines, herbs, non-prescription drugs, or dietary supplements you use. Also tell them if you smoke, drink alcohol, or use illegal drugs. Some items may interact with your medicine. What should I watch for while using this medication? Visit your health care provider for regular checks on your progress. Tell your health care provider if your symptoms do not start to get better or if they get worse. A painful sore throat or sore throat with high fevers, headaches, nausea, or vomiting may be signs of a serious infection. Call your health care provider if these symptoms occur. Do not use this medication for more than 2 days or give to children under 35 years of age unless your health care provider tells you to. Do not take other medications that contain aspirin, ibuprofen, or naproxen with this medication. Side effects such as stomach upset, nausea, or ulcers may be more likely to occur. Many non-prescription medications contain aspirin, ibuprofen, or naproxen. Always read labels carefully. This medication can cause serious ulcers and bleeding in the stomach. It can happen with no warning. Smoking, drinking alcohol, older age, and poor health can also increase risks. Call your health care provider right away if you have stomach pain or blood in your vomit or stool. This medication does not prevent a heart attack or stroke. This medication may increase the chance of a heart  attack or stroke. The chance may increase the longer you use this medication or if you have heart disease. If you take aspirin to prevent a heart attack or stroke, talk to your health care provider about using this medication. Alcohol may interfere with the effect of this medication. Avoid alcohol-containing drinks. This medication may cause serious skin reactions. They can happen weeks to months after starting the medication. Contact your health care provider right away if you notice fevers or flu-like symptoms with a rash. The rash may be red or purple and then turn into blisters or peeling of the skin. Or, you might notice a red rash with swelling of the face, lips or lymph nodes in your neck or under your arms. Talk to your health care provider if you are pregnant before taking this medication. Taking this medication between weeks 20 and 30 of pregnancy may harm your unborn baby. Your health care provider will monitor you closely if you need to take it. After 30 weeks of pregnancy, do not take this medication. You may get drowsy or dizzy. Do not drive, use machinery, or do anything that needs mental alertness until you know how this medication affects you. Do not stand up or sit up quickly, especially if you are an older patient. This reduces the risk of dizzy or fainting spells. Be careful brushing or flossing your teeth or using a toothpick because you may get an infection or bleed more easily. If you have any dental work done, tell your dentist you are receiving this medication. This medication may make it more difficult to get pregnant. Talk to your health care provider if you are concerned about your fertility. What side effects may I notice from receiving this medication? Side effects  that you should report to your care team as soon as possible: Allergic reactions--skin rash, itching, hives, swelling of the face, lips, tongue, or throat Bleeding--bloody or black, tar-like stools, vomiting blood  or brown material that looks like coffee grounds, red or dark brown urine, small red or purple spots on skin, unusual bruising or bleeding Heart attack--pain or tightness in the chest, shoulders, arms, or jaw, nausea, shortness of breath, cold or clammy skin, feeling faint or lightheaded Heart failure--shortness of breath, swelling of the ankles, feet, or hands, sudden weight gain, unusual weakness or fatigue Increase in blood pressure Kidney injury--decrease in the amount of urine, swelling of the ankles, hands, or feet Liver injury--right upper belly pain, loss of appetite, nausea, light-colored stool, dark yellow or brown urine, yellowing skin or eyes, unusual weakness or fatigue Rash, fever, and swollen lymph nodes Redness, blistering, peeling, or loosening of the skin, including inside the mouth Stroke--sudden numbness or weakness of the face, arm, or leg, trouble speaking, confusion, trouble walking, loss of balance or coordination, dizziness, severe headache, change in vision Side effects that usually do not require medical attention (report to your care team if they continue or are bothersome): Headache Loss of appetite Nausea Upset stomach This list may not describe all possible side effects. Call your doctor for medical advice about side effects. You may report side effects to FDA at 1-800-FDA-1088. Where should I keep my medication? Keep out of the reach of children and pets. Store at room temperature between 20 and 25 degrees C (68 and 77 degrees F). Get rid of any unused medication after the expiration date. To get rid of medications that are no longer needed or have expired: Take the medication to a medication take-back program. Check with your pharmacy or law enforcement to find a location. If you cannot return the medication, check the label or package insert to see if the medication should be thrown out in the garbage or flushed down the toilet. If you are not sure, ask your  care team. If it is safe to put it in the trash, empty the medication out of the container. Mix the medication with cat litter, dirt, coffee grounds, or other unwanted substance. Seal the mixture in a bag or container. Put it in the trash. NOTE: This sheet is a summary. It may not cover all possible information. If you have questions about this medicine, talk to your doctor, pharmacist, or health care provider.  2022 Elsevier/Gold Standard (2020-05-24 00:00:00)

## 2021-05-31 ENCOUNTER — Other Ambulatory Visit: Payer: BC Managed Care – PPO

## 2021-06-02 ENCOUNTER — Encounter: Payer: Self-pay | Admitting: Adult Health

## 2021-06-04 ENCOUNTER — Encounter: Payer: BC Managed Care – PPO | Attending: Adult Health | Admitting: Dietician

## 2021-06-04 ENCOUNTER — Telehealth: Payer: Self-pay

## 2021-06-04 ENCOUNTER — Other Ambulatory Visit: Payer: Self-pay

## 2021-06-04 DIAGNOSIS — E119 Type 2 diabetes mellitus without complications: Secondary | ICD-10-CM | POA: Insufficient documentation

## 2021-06-04 NOTE — Telephone Encounter (Signed)
PA submitted for pts Tramadol 50 mg for approval or denial.  [id number]

## 2021-06-06 ENCOUNTER — Ambulatory Visit: Payer: BC Managed Care – PPO | Admitting: Pharmacist

## 2021-06-06 DIAGNOSIS — I10 Essential (primary) hypertension: Secondary | ICD-10-CM

## 2021-06-06 DIAGNOSIS — Z720 Tobacco use: Secondary | ICD-10-CM

## 2021-06-06 DIAGNOSIS — E119 Type 2 diabetes mellitus without complications: Secondary | ICD-10-CM

## 2021-06-06 MED ORDER — NICOTINE 21 MG/24HR TD PT24: 21.0000 mg | MEDICATED_PATCH | Freq: Every day | TRANSDERMAL | 3 refills | Status: DC

## 2021-06-06 MED ORDER — NICOTINE POLACRILEX 4 MG MT GUM: 4.0000 mg | CHEWING_GUM | OROMUCOSAL | 3 refills | Status: DC | PRN

## 2021-06-06 NOTE — Patient Instructions (Signed)
Judy Garcia,   It was great talking to you today!  Continue metformin 500 mg twice daily. I recommend an A1c goal for YOU of less than 6.5%. Moving forward, we can think about agents like Ozempic that treat diabetes but can also help with weight management. Please call us if the diarrhea doesn't completely resolve and we can switch to the extended release metformin  Continue losartan 25 mg daily. This is a blood pressure medication that helps protect your kidneys from the long term effects of diabetes.   Call the [place] Quitline at 1-800-QUIT-NOW. They can help provide counseling support and nicotine replacement therapy.   Apply one 21 mg patch every morning. Wear it for 24 hours, unless you develop trouble sleeping, then remove before you go to bed.  Also, use nicotine 4 mg gum throughout the day as needed. Use the chew and park method that we discussed. You can cut the gum in half if you feel like you need a smaller dose. Use this throughout the day if you feel like you have breakthrough cravings or need to proactively use nicotine when you normally would smoke (when you wake up, around meals, when driving, etc)>   Let us know if you need anything in the meantime!  Catie Darnelle Maffucci, PharmD
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
## 2021-06-07 ENCOUNTER — Encounter: Payer: Self-pay | Admitting: Dietician

## 2021-06-07 NOTE — Progress Notes (Addendum)
Patient was seen on 06/04/2021 for the first of a series of three diabetes self-management courses at [facility].  Patient Education Plan per assessed needs and concerns is to attend three course education program for Diabetes Self Management Education.  6.8% 04/19/2021  The following learning objectives were met by the patient during this class: Describe diabetes, types of diabetes and pathophysiology State some common risk factors for diabetes Defines the role of glucose and insulin Describe the relationship between diabetes and cardiovascular and other risks State the members of the Healthcare Team States the rationale for glucose monitoring and when to test State their individual [place] the importance of logging glucose readings and how to interpret the readings Identifies A1C target Explain the correlation between A1c and eAG values State symptoms and treatment of high blood glucose and low blood glucose Explain proper technique for glucose testing and identify proper sharps disposal  Handouts given during class include: How to Thrive:  A Guide for Your Journey with Diabetes by the ADA Meal Plan Card and carbohydrate content list Dietary intake form Low Sodium Flavoring Tips Types of Fats Dining Out Label reading Snack list The diabetes portion plate Diabetes Resources A1c to eAG Conversion Chart Blood Glucose Log Diabetes Recommended Care Schedule Support Group Diabetes Success Plan Core Class Satisfaction Survey   Follow-Up Plan: Attend core 2

## 2021-06-11 ENCOUNTER — Other Ambulatory Visit: Payer: Self-pay | Admitting: Adult Health

## 2021-06-11 ENCOUNTER — Telehealth: Payer: Self-pay | Admitting: Adult Health

## 2021-06-11 DIAGNOSIS — N644 Mastodynia: Secondary | ICD-10-CM

## 2021-06-11 NOTE — Telephone Encounter (Signed)
Pt called stating that [facility] put in a diagnostic mammogram order for the provider to sign. The order that the provider put in is a regular one and it needs to be a diagnostic one. The pt said the order is in right now in the system

## 2021-06-11 NOTE — Telephone Encounter (Signed)
error 

## 2021-06-12 ENCOUNTER — Encounter: Payer: Self-pay | Admitting: Adult Health

## 2021-06-12 ENCOUNTER — Telehealth (INDEPENDENT_AMBULATORY_CARE_PROVIDER_SITE_OTHER): Payer: BC Managed Care – PPO | Admitting: Adult Health

## 2021-06-12 ENCOUNTER — Telehealth: Payer: Self-pay | Admitting: Adult Health

## 2021-06-12 VITALS — Ht 60.98 in | Wt 241.0 lb

## 2021-06-12 DIAGNOSIS — I10 Essential (primary) hypertension: Secondary | ICD-10-CM

## 2021-06-12 MED ORDER — SPIRONOLACTONE 25 MG PO TABS: 12.5000 mg | ORAL_TABLET | Freq: Every day | ORAL | 0 refills | Status: DC

## 2021-06-12 NOTE — Progress Notes (Signed)
Virtual Visit via Video Note  I connected with Judy Garcia on [birth year] at  4:00 PM EST by a video enabled telemedicine application and verified that I am speaking with the correct person using two identifiers.  Location: Patient: at home  Provider: Provider: Provider's office at  [facility], [place].      I discussed the limitations of evaluation and management by telemedicine and the availability of in person appointments. The patient expressed understanding and agreed to proceed. Only the patient and myself were present for today's video call.   History of Present Illness:  Patient is on video today, she feels when she started the Losartan around a week ago, she checked blood pressure and was 118/109.  She had it checked at work. They gave her a clonopin at her work and blood pressure came down to 158/ 92. Denies any throat swelling. She just noted she had fluid in her legs. Denies any angioedema symptoms, or problems swallowing .She notes she was not urinating as much as she was on the [facility]. The fluid is now off since starting the Maxide back.   She then started taking the Maxide again and she says blood pressure is now back to normal 142/90.   Patient  denies any fever, body aches,chills, rash, chest pain, shortness of breath, nausea, vomiting, or diarrhea.  Denies dizziness, lightheadedness, pre syncopal or syncopal episodes.     Observations/Objective:   Gen: Awake, alert, no acute distress Resp: Breathing is even and non-labored Psych: calm/pleasant demeanor Neuro: Alert and Oriented x 3, + facial symmetry, speech is clear.   Assessment and Plan:  Essential hypertension - Plan: spironolactone (ALDACTONE) 25 MG tablet, Comprehensive metabolic panel Trial continue Losartan as prescribed and add on 12.5 mg of spirolactone 1/2 tablet.  Discontinue Maxide given her history hypokalemia.  Red Flags discussed. The patient was given clear  instructions to go to ER or return to medical center if any red flags develop, symptoms do not improve, worsen or new problems develop. They verbalized understanding.  Return in about 3 weeks (around 07/03/2021), or if symptoms worsen or fail to improve, for at any time for any worsening symptoms, Go to Emergency room/ urgent care if worse.  Follow Up Instructions:    I discussed the assessment and treatment plan with the patient. The patient was provided an opportunity to ask questions and all were answered. The patient agreed with the plan and demonstrated an understanding of the instructions.   The patient was advised to call back or seek an in-person evaluation if the symptoms worsen or if the condition fails to improve as anticipated.  Marcille Buffy, FNP

## 2021-06-12 NOTE — Patient Instructions (Signed)
Will stop Maxide.   Hypertension, Adult Hypertension is another name for high blood pressure. High blood pressure forces your heart to work harder to pump blood. This can cause problems over time. There are two numbers in a blood pressure reading. There is a top number (systolic) over a bottom number (diastolic). It is best to have a blood pressure that is below 120/80. Healthy choices can help lower your blood pressure, or you may need medicine to help lower it. What are the causes? The cause of this condition is not known. Some conditions may be related to high blood pressure. What increases the risk? Smoking. Having type 2 diabetes mellitus, high cholesterol, or both. Not getting enough exercise or physical activity. Being overweight. Having too much fat, sugar, calories, or salt (sodium) in your diet. Drinking too much alcohol. Having long-term (chronic) kidney disease. Having a family history of high blood pressure. Age. Risk increases with age. Race. You may be at higher risk if you are African American. Gender. Men are at higher risk than women before age 12. After age 41, women are at higher risk than men. Having obstructive sleep apnea. Stress. What are the signs or symptoms? High blood pressure may not cause symptoms. Very high blood pressure (hypertensive crisis) may cause: Headache. Feelings of worry or nervousness (anxiety). Shortness of breath. Nosebleed. A feeling of being sick to your stomach (nausea). Throwing up (vomiting). Changes in how you see. Very bad chest pain. Seizures. How is this treated? This condition is treated by making healthy lifestyle changes, such as: Eating healthy foods. Exercising more. Drinking less alcohol. Your health care provider may prescribe medicine if lifestyle changes are not enough to get your blood pressure under control, and if: Your top number is above 130. Your bottom number is above 80. Your personal target blood pressure  may vary. Follow these instructions at home: Eating and drinking  If told, follow the DASH eating plan. To follow this plan: Fill one half of your plate at each meal with fruits and vegetables. Fill one fourth of your plate at each meal with whole grains. Whole grains include whole-wheat pasta, brown rice, and whole-grain bread. Eat or drink low-fat dairy products, such as skim milk or low-fat yogurt. Fill one fourth of your plate at each meal with low-fat (lean) proteins. Low-fat proteins include fish, chicken without skin, eggs, beans, and tofu. Avoid fatty meat, cured and processed meat, or chicken with skin. Avoid pre-made or processed food. Eat less than 1,500 mg of salt each day. Do not drink alcohol if: Your doctor tells you not to drink. You are pregnant, may be pregnant, or are planning to become pregnant. If you drink alcohol: Limit how much you use to: 0-1 drink a day for women. 0-2 drinks a day for men. Be aware of how much alcohol is in your drink. In the [place] one drink equals one 12 oz bottle of beer (355 mL), one 5 oz glass of wine (148 mL), or one 1 oz glass of hard liquor (44 mL). Lifestyle  Work with your doctor to stay at a healthy weight or to lose weight. Ask your doctor what the best weight is for you. Get at least 30 minutes of exercise most days of the week. This may include walking, swimming, or biking. Get at least 30 minutes of exercise that strengthens your muscles (resistance exercise) at least 3 days a week. This may include lifting weights or doing Pilates. Do not use any products that  contain nicotine or tobacco, such as cigarettes, e-cigarettes, and chewing tobacco. If you need help quitting, ask your doctor. Check your blood pressure at home as told by your doctor. Keep all follow-up visits as told by your doctor. This is important. Medicines Take over-the-counter and prescription medicines only as told by your doctor. Follow directions carefully. Do  not skip doses of blood pressure medicine. The medicine does not work as well if you skip doses. Skipping doses also puts you at risk for problems. Ask your doctor about side effects or reactions to medicines that you should watch for. Contact a doctor if you: Think you are having a reaction to the medicine you are taking. Have headaches that keep coming back (recurring). Feel dizzy. Have swelling in your ankles. Have trouble with your vision. Get help right away if you: Get a very bad headache. Start to feel mixed up (confused). Feel weak or numb. Feel faint. Have very bad pain in your: Chest. Belly (abdomen). Throw up more than once. Have trouble breathing. Summary Hypertension is another name for high blood pressure. High blood pressure forces your heart to work harder to pump blood. For most people, a normal blood pressure is less than 120/80. Making healthy choices can help lower blood pressure. If your blood pressure does not get lower with healthy choices, you may need to take medicine. This information is not intended to replace advice given to you by your health care provider. Make sure you discuss any questions you have with your health care provider. Document Revised: 01/27/2018 Document Reviewed: 01/27/2018 Elsevier Patient Education  [place]. Losartan Tablets What is this medication? LOSARTAN (loe SAR tan) treats high blood pressure. It may also be used to prevent a stroke in people with heart disease and high blood pressure. It can be used to prevent kidney damage in people with diabetes. It works by relaxing the blood vessels, which helps decrease the amount of work your heart has to do. It belongs to a group of medications called ARBs. This medicine may be used for other purposes; ask your health care provider or pharmacist if you have questions. COMMON BRAND NAME(S): Cozaar What should I tell my care team before I take this medication? They need to know if  you have any of these conditions: Heart failure Kidney disease Liver disease An unusual or allergic reaction to losartan, other medications, foods, dyes, or preservatives Pregnant or trying to get pregnant Breast-feeding How should I use this medication? Take this medication by mouth. Take it as directed on the prescription label at the same time every day. You can take it with or without food. If it upsets your stomach, take it with food. Keep taking it unless your care team tells you to stop. Talk to your care team about the use of this medication in children. While it may be prescribed for children as young as 6 for selected conditions, precautions do apply. Overdosage: If you think you have taken too much of this medicine contact a poison control center or emergency room at once. NOTE: This medicine is only for you. Do not share this medicine with others. What if I miss a dose? If you miss a dose, take it as soon as you can. If it is almost time for your next dose, take only that dose. Do not take double or extra doses. What may interact with this medication? Aliskiren ACE inhibitors, like enalapril or lisinopril Diuretics, especially amiloride, eplerenone, spironolactone, or triamterene Lithium NSAIDs,  medications for pain and inflammation, like ibuprofen or naproxen Potassium salts or potassium supplements This list may not describe all possible interactions. Give your health care provider a list of all the medicines, herbs, non-prescription drugs, or dietary supplements you use. Also tell them if you smoke, drink alcohol, or use illegal drugs. Some items may interact with your medicine. What should I watch for while using this medication? Visit your care team for regular check ups. Check your blood pressure as directed. Ask your care team what your blood pressure should be. Also, find out when you should contact them. Do not treat yourself for coughs, colds, or pain while you are using  this medication without asking your care team for advice. Some medications may increase your blood pressure. Women should inform their care team if they wish to become pregnant or think they might be pregnant. There is a potential for serious side effects to an unborn child. Talk to your care team for more information. You may get drowsy or dizzy. Do not drive, use machinery, or do anything that needs mental alertness until you know how this medication affects you. Do not stand or sit up quickly, especially if you are an older patient. This reduces the risk of dizzy or fainting spells. Alcohol can make you more drowsy and dizzy. Avoid alcoholic drinks. Avoid salt substitutes unless you are told otherwise by your care team. What side effects may I notice from receiving this medication? Side effects that you should report to your care team as soon as possible: Allergic reactions--skin rash, itching, hives, swelling of the face, lips, tongue, or throat High potassium level--muscle weakness, fast or irregular heartbeat Kidney injury--decrease in the amount of urine, swelling of the ankles, hands, or feet Low blood pressure--dizziness, feeling faint or lightheaded, blurry vision Side effects that usually do not require medical attention (report to your care team if they continue or are bothersome): Dizziness Headache Runny or stuffy nose This list may not describe all possible side effects. Call your doctor for medical advice about side effects. You may report side effects to FDA at 1-800-FDA-1088. Where should I keep my medication? Keep out of the reach of children and pets. Store at room temperature between 20 and 25 degrees C (68 and 77 degrees F). Protect from light. Keep the container tightly closed. Get rid of any unused medication after the expiration date. To get rid of medications that are no longer needed or have expired: Take the medication to a medication take-back program. Check with your  pharmacy or law enforcement to find a location. If you cannot return the medication, check the label or package insert to see if the medication should be thrown out in the garbage or flushed down the toilet. If you are not sure, ask your care team. If it is safe to put in the trash, empty the medication out of the container. Mix the medication with cat litter, dirt, coffee grounds, or other unwanted substance. Seal the mixture in a bag or container. Put it in the trash. NOTE: This sheet is a summary. It may not cover all possible information. If you have questions about this medicine, talk to your doctor, pharmacist, or health care provider.  2022 Elsevier/Gold Standard (2021-02-05 00:00:00) Spironolactone Tablets What is this medication? SPIRONOLACTONE ([place] LAK tone) treats high blood pressure and heart failure. It may also be used to reduce swelling related to heart, kidney, or liver disease. It helps your kidneys remove more fluid and  salt from your blood through the urine without losing too much potassium. It belongs to a group of medications called diuretics. This medicine may be used for other purposes; ask your health care provider or pharmacist if you have questions. COMMON BRAND NAME(S): Aldactone What should I tell my care team before I take this medication? They need to know if you have any of these conditions: Addison's disease or low adrenal gland function High blood level of potassium Kidney disease Liver disease An unusual or allergic reaction to spironolactone, other medications, foods, dyes, or preservatives Pregnant or trying to get pregnant Breast-feeding How should I use this medication? Take this medication by mouth. Take it as directed on the prescription label at the same time every day. You can take it with or without food. You should always take it the same way. Keep taking it unless your care team tells you to stop. Talk to your care team about the use of this  medication in children. Special care may be needed. Overdosage: If you think you have taken too much of this medicine contact a poison control center or emergency room at once. NOTE: This medicine is only for you. Do not share this medicine with others. What if I miss a dose? If you miss a dose, take it as soon as you can. If it is almost time for your next dose, take only that dose. Do not take double or extra doses. What may interact with this medication? Do not take this medication with any of the following: Cidofovir Eplerenone Tranylcypromine This medication may also interact with the following: Aspirin Certain medications for blood pressure or heart disease like benazepril, lisinopril, losartan, valsartan Certain medications that treat or prevent blood clots like heparin and enoxaparin Cholestyramine Cyclosporine Digoxin Lithium Medications that relax muscles for surgery NSAIDs, medications for pain and inflammation, like ibuprofen or naproxen Other diuretics Potassium salts or supplements Steroid medications like prednisone or cortisone Trimethoprim This list may not describe all possible interactions. Give your health care provider a list of all the medicines, herbs, non-prescription drugs, or dietary supplements you use. Also tell them if you smoke, drink alcohol, or use illegal drugs. Some items may interact with your medicine. What should I watch for while using this medication? Visit your care team for regular checks on your progress. Check your blood pressure as directed. Ask your care team what your blood pressure should be. Also, find out when you should contact him or her. Do not treat yourself for coughs, colds, or pain while you are using this medication without asking your care team for advice. Some medications may increase your blood pressure. Check with your care team if you have severe diarrhea, nausea, and vomiting, or if you sweat a lot. The loss of too much body  fluid may make it dangerous for you to take this medication. You may need to be on a special diet while taking this medication. Ask your care team. Also, find out how many glasses of fluid you need to drink each day. You may get drowsy or dizzy. Do not drive, use machinery, or do anything that needs mental alertness until you know how this medication affects you. Do not stand or sit up quickly, especially if you are an older patient. This reduces the risk of dizzy or fainting spells. Alcohol may interfere with the effects of this medication. Avoid alcoholic drinks. Avoid salt substitutes unless you are told otherwise by your care team. What side effects may I  notice from receiving this medication? Side effects that you should report to your care team as soon as possible: Allergic reactions--skin rash, itching, hives, swelling of the face, lips, tongue, or throat Dehydration--increased thirst, dry mouth, feeling faint or lightheaded, headache, dark yellow or brown urine High potassium level--muscle weakness, fast or irregular heartbeat Kidney injury--decrease in the amount of urine, swelling of the ankles, hands, or feet Low blood pressure--dizziness, feeling faint or lightheaded, blurry vision Low sodium level--muscle weakness, fatigue, dizziness, headache, confusion Side effects that usually do not require medical attention (report to your care team if they continue or are bothersome): Breast pain or tenderness Changes in sex drive or performance Dizziness Headache Irregular menstrual cycles or spotting Unexpected breast tissue growth This list may not describe all possible side effects. Call your doctor for medical advice about side effects. You may report side effects to FDA at 1-800-FDA-1088. Where should I keep my medication? Keep out of the reach of children and pets. Store below 25 degrees C (77 degrees F). Get rid of any unused medication after the expiration date. To get rid of  medications that are no longer needed or have expired: Take the medication to a medication take-back program. Check with your pharmacy or law enforcement to find a location. If you cannot return the medication, check the label or package insert to see if the medication should be thrown out in the garbage or flushed down the toilet. If you are not sure, ask your care team. If it is safe to put into the trash, take the medication out of the container. Mix the medication with cat litter, dirt, coffee grounds, or other unwanted substance. Seal the mixture in a bag or container. Put it in the trash. NOTE: This sheet is a summary. It may not cover all possible information. If you have questions about this medicine, talk to your doctor, pharmacist, or health care provider.  2022 Elsevier/Gold Standard (2021-02-05 00:00:00)

## 2021-06-12 NOTE — Telephone Encounter (Signed)
Lft pt vm to call ofc to call [place] to sch diag mammo and [place]. thanks

## 2021-06-12 NOTE — Telephone Encounter (Signed)
Correct screening mammogram was originally placed as no records of abnormal mammograms were on file for patient nor any concerns expressed by patient. I did sign orders sent by [facility] breast center. I still do not have any previous records for mammograms. If we can obtain.

## 2021-06-14 ENCOUNTER — Other Ambulatory Visit: Payer: BC Managed Care – PPO

## 2021-06-14 ENCOUNTER — Encounter: Payer: Self-pay | Admitting: Obstetrics and Gynecology

## 2021-06-14 NOTE — Addendum Note (Signed)
Addended by: Leeanne Rio on: 06/14/2021 11:52 AM   Modules accepted: Orders

## 2021-06-18 ENCOUNTER — Ambulatory Visit: Payer: BC Managed Care – PPO

## 2021-07-04 ENCOUNTER — Telehealth (INDEPENDENT_AMBULATORY_CARE_PROVIDER_SITE_OTHER): Payer: BC Managed Care – PPO | Admitting: Pharmacist

## 2021-07-04 ENCOUNTER — Encounter: Payer: Self-pay | Admitting: Pharmacist

## 2021-07-04 DIAGNOSIS — Z716 Tobacco abuse counseling: Secondary | ICD-10-CM

## 2021-07-04 DIAGNOSIS — I10 Essential (primary) hypertension: Secondary | ICD-10-CM

## 2021-07-04 DIAGNOSIS — Z720 Tobacco use: Secondary | ICD-10-CM

## 2021-07-04 DIAGNOSIS — E119 Type 2 diabetes mellitus without complications: Secondary | ICD-10-CM

## 2021-07-04 NOTE — Patient Instructions (Signed)
Mckaylee,   It was great talking to you today!  Please check your blood pressure daily at home, write down these readings, and bring to Ms. Flinchum for follow up. Make sure you schedule an in office follow up appointment with her in February.   Call the [place] Quitline - 1-800-QUIT-NOW. They can usually provide a supply of free nicotine replacement therapy.   You can also take these GoodRx coupons to [facility] to help reduce the price of the patches and gum prescriptions I have previously sent.   Take care!  Catie Darnelle Maffucci, PharmD

## 2021-07-04 NOTE — Progress Notes (Signed)
Chief Complaint  Patient presents with   Hypertension    Judy Garcia is a 51 y.o. year old female who was referred for medication management by their primary care provider, Flinchum, Kelby Aline, FNP. They presented for a virtual visit in the context of the COVID-19 pandemic.   However, video visit connection was unable to occur  Subjective: Hypertension:  Current medications: losartan 25 mg daily, spironolactone 12.5 mg daily   Current blood pressure readings readings: reports home readings ~110-130s/70s. Reports tolerability and appreciates diuretic benefit.   Patient denies hypotensive s/sx including dizziness, lightheadedness.   Tobacco Abuse:  Tobacco Use History: Age when started using tobacco on a daily basis: 12 years  Number of cigarettes per day ~ 0.5 ppd (decreased from last visit) Smokes first cigarette 0 minutes after waking Does wake at night to smoke Triggers include stress  Quit Attempt History: Most recent quit attempt: working on reducing now Has not purchased nicotine gum or lozenges yet due to cost. Has not called [place] [facility]uit Line    Objective: Lab Results  Component Value Date   HGBA1C 6.8 (H) 04/19/2021    Lab Results  Component Value Date   CREATININE 0.66 04/19/2021   BUN 15 04/19/2021   NA 139 04/19/2021   K 3.1 (L) 04/19/2021   CL 100 04/19/2021   CO2 28 04/19/2021    Lab Results  Component Value Date   CHOL 200 08/11/2019   HDL 55.00 08/11/2019   LDLCALC 121 (H) 08/11/2019   TRIG 118.0 08/11/2019   CHOLHDL 4 08/11/2019    Medications Reviewed Today     Reviewed by De Hollingshead, RPH-CPP (Pharmacist) on 07/04/21 at 1145  Med List Status: <None>   Medication Order Taking? Sig Documenting Provider Last Dose Status Informant  albuterol (VENTOLIN HFA) 108 (90 Base) MCG/ACT inhaler [id number]  INHALE 1 TO 2 PUFFS INTO THE LUNGS EVERY 6 HOURS AS NEEDED FOR WHEEZING OR SHORTNESS OF BREATH Flinchum, Kelby Aline, FNP  Active             Med Note Darnelle Maffucci, Poppi Scantling E   Thu Jun 06, 2021 11:04 AM) 1-2 days per week  Ascorbic Acid (VITAMIN C) 100 MG tablet [id number]  Take 100 mg by mouth as needed. [provider]  Active   azelastine (ASTELIN) 0.1 % nasal spray [id number]  Place into the nose. [provider]  Active   blood glucose meter kit and supplies [id number]  Dispense based on patient and insurance preference. Use up to four times daily as directed. (FOR ICD-10 E10.9, E11.9). Flinchum, Kelby Aline, FNP  Active   docusate sodium (COLACE) 100 MG capsule [id number]  Take 1 capsule (100 mg total) by mouth daily. Jodelle Green, FNP  Active   fluticasone furoate-vilanterol (BREO ELLIPTA) 100-25 MCG/INH AEPB [id number]  Inhale 1 puff into the lungs daily. McLean-Scocuzza, Nino Glow, MD  Active   losartan (COZAAR) 25 MG tablet [id number] Yes Take 1 tablet (25 mg total) by mouth daily. Flinchum, Kelby Aline, FNP Taking Active   metFORMIN (GLUCOPHAGE) 500 MG tablet [id number]  Take 1 tablet (500 mg total) by mouth 2 (two) times daily with a meal. Flinchum, Kelby Aline, FNP  Active   Multiple Vitamin (MULTIVITAMIN) tablet [id number]  Take 1 tablet by mouth daily. [provider]  Active   nicotine (NICODERM CQ - DOSED IN MG/24 HOURS) 21 mg/24hr patch [id number]  Place 1 patch (21 mg total) onto the skin daily. Flinchum, Sharyn Lull  S, FNP  Active   nicotine polacrilex (NICORETTE) 4 MG gum [id number]  Take 1 each (4 mg total) by mouth as needed for smoking cessation. Flinchum, Kelby Aline, FNP  Active   ondansetron (ZOFRAN) 4 MG tablet [id number]  Take 1 tablet (4 mg total) by mouth every 8 (eight) hours as needed for nausea or vomiting. Doreen Beam, FNP  Active   PROAIR RESPICLICK 861 [phone] Base) MCG/ACT AEPB [id number]  INHALE 2 PUFFS INTO THE LUNGS FOUR TIMES DAILY AS NEEDED Flinchum, Kelby Aline, FNP  Active   spironolactone (ALDACTONE) 25 MG tablet [id number] Yes Take 0.5 tablets (12.5 mg total) by mouth daily.  Flinchum, Kelby Aline, FNP Taking Active   venlafaxine XR (EFFEXOR-XR) 75 MG 24 hr capsule [id number]  TAKE 3 CAPSULES(225 MG) BY MOUTH DAILY WITH BREAKFAST FOR 7 DAYS Flinchum, Kelby Aline, FNP  Active             Assessment/Plan:   Hypertension: - Currently controlled per home readings - Continue current regimen. Scheduled non fasting labs Monday (patient's first available) and discussed with PCP, placed order for BMP. Recommended to check home blood pressure and heart rate daily - Attempted to schedule follow up with PCP, however, patient declined - reports she gets her next 6 week schedule this coming Monday and will schedule PCP follow pu after that  Tobacco Abuse - Currently uncontrolled - Provided motivational interviewing to assess tobacco use and strategies for reduction - Provided information on 1 800 QUIT NOW support program. Encouraged to call to discuss free supply of nicotine replacement products.  - Will provide GoodRx coupons for current prescriptions  Follow Up Plan: video visit in 4 weeks  Catie Darnelle Maffucci, PharmD, [place], CPP Clinical Pharmacist [employer] [facility] [employer] [phone]

## 2021-07-08 ENCOUNTER — Other Ambulatory Visit: Payer: BC Managed Care – PPO

## 2021-07-30 ENCOUNTER — Other Ambulatory Visit: Payer: Self-pay | Admitting: *Deleted

## 2021-07-30 ENCOUNTER — Inpatient Hospital Stay
Admission: RE | Admit: 2021-07-30 | Discharge: 2021-07-30 | Disposition: A | Discharge status: Home / Self Care | Payer: Self-pay | Source: Ambulatory Visit | Attending: *Deleted | Admitting: *Deleted

## 2021-07-30 DIAGNOSIS — Z1231 Encounter for screening mammogram for malignant neoplasm of breast: Secondary | ICD-10-CM

## 2021-08-08 ENCOUNTER — Telehealth: Payer: BC Managed Care – PPO

## 2021-08-15 ENCOUNTER — Other Ambulatory Visit: Payer: BC Managed Care – PPO

## 2021-08-15 ENCOUNTER — Inpatient Hospital Stay: Admission: RE | Admit: 2021-08-15 | Payer: BC Managed Care – PPO | Source: Ambulatory Visit

## 2021-09-05 ENCOUNTER — Telehealth: Payer: Self-pay | Admitting: *Deleted

## 2021-09-05 DIAGNOSIS — I10 Essential (primary) hypertension: Secondary | ICD-10-CM

## 2021-09-05 MED ORDER — LOSARTAN POTASSIUM 25 MG PO TABS: 25.0000 mg | ORAL_TABLET | Freq: Every day | ORAL | 0 refills | Status: DC

## 2021-09-05 NOTE — Telephone Encounter (Signed)
Pt is scheduled for TOC with Ar[facility]. Pt has complications with the medication that Flinchum put her on-metformin. Pt is having diarrhea, constipation, stomach cramps, increase loss of appetite and not losing weight. ?

## 2021-09-05 NOTE — Telephone Encounter (Signed)
Patient has TOC scheduled for 10/09/2021 say she is having trouble with GI symptoms from using Metformin Flatulence , diarrhea and stomach cramping . ?

## 2021-09-05 NOTE — Addendum Note (Signed)
Addended by: Nanci Pina on: 09/05/2021 03:56 PM ? ? Modules accepted: Orders ? ?

## 2021-09-05 NOTE — Telephone Encounter (Signed)
Left message for patient to call office , patient needs [facility] appointment in order for medication refills. Please schedule and advise when completed. ?

## 2021-09-16 ENCOUNTER — Telehealth: Payer: Self-pay

## 2021-09-16 NOTE — Telephone Encounter (Signed)
[place] office to discuss concerns since beginning Metformin.Would like to schedule appointment to come in sooner of needed. ?

## 2021-09-16 NOTE — Telephone Encounter (Signed)
[place] office to discuss concerns since beginning Metformin.Would like to schedule appointment to come in sooner of needed. ?

## 2021-10-09 ENCOUNTER — Ambulatory Visit
Admission: RE | Admit: 2021-10-09 | Discharge: 2021-10-09 | Disposition: A | Discharge status: Home / Self Care | Payer: BC Managed Care – PPO | Source: Ambulatory Visit | Attending: Adult Health | Admitting: Adult Health

## 2021-10-09 ENCOUNTER — Encounter: Payer: BC Managed Care – PPO | Admitting: Family

## 2021-10-09 ENCOUNTER — Telehealth: Payer: Self-pay

## 2021-10-09 DIAGNOSIS — N644 Mastodynia: Secondary | ICD-10-CM | POA: Diagnosis present

## 2021-10-09 NOTE — Telephone Encounter (Signed)
Lvm for pt to return call in regards to mammogram results.  ? 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

## 2021-10-10 ENCOUNTER — Encounter: Payer: BC Managed Care – PPO | Admitting: Internal Medicine

## 2021-10-11 NOTE — Telephone Encounter (Signed)
I called and left message for patient to call and reschedule ?

## 2021-10-16 ENCOUNTER — Encounter: Payer: BC Managed Care – PPO | Admitting: Internal Medicine

## 2021-10-23 ENCOUNTER — Encounter: Payer: BC Managed Care – PPO | Admitting: Internal Medicine

## 2021-10-23 ENCOUNTER — Telehealth: Payer: Self-pay | Admitting: Internal Medicine

## 2021-10-23 NOTE — Telephone Encounter (Signed)
This pt has no showed 3 back to back times what to do about this?

## 2021-10-24 ENCOUNTER — Encounter: Payer: Self-pay | Admitting: Adult Health

## 2021-10-27 ENCOUNTER — Other Ambulatory Visit: Payer: Self-pay | Admitting: Internal Medicine

## 2021-10-27 DIAGNOSIS — F32A Anxiety disorder, unspecified: Secondary | ICD-10-CM

## 2021-10-27 DIAGNOSIS — Z760 Encounter for issue of repeat prescription: Secondary | ICD-10-CM

## 2021-11-02 ENCOUNTER — Telehealth: Payer: Self-pay | Admitting: Internal Medicine

## 2021-11-02 DIAGNOSIS — Z760 Encounter for issue of repeat prescription: Secondary | ICD-10-CM

## 2021-11-02 DIAGNOSIS — F32A Depression, unspecified: Secondary | ICD-10-CM

## 2021-11-04 ENCOUNTER — Other Ambulatory Visit: Payer: Self-pay

## 2021-11-04 DIAGNOSIS — Z760 Encounter for issue of repeat prescription: Secondary | ICD-10-CM

## 2021-11-04 DIAGNOSIS — F32A Depression, unspecified: Secondary | ICD-10-CM

## 2021-11-04 MED ORDER — VENLAFAXINE HCL ER 75 MG PO CP24: ORAL_CAPSULE | ORAL | 1 refills | Status: DC

## 2021-11-04 NOTE — Telephone Encounter (Signed)
I have sent until she sees Dr. Caryl Bis.

## 2021-11-04 NOTE — Telephone Encounter (Signed)
Pt need refill on venlafaxine XR  '75mg'$  sent to [facility]-3 capsules one time a day. Pt has TOC with sonnenberg on July 18

## 2021-11-06 ENCOUNTER — Ambulatory Visit (INDEPENDENT_AMBULATORY_CARE_PROVIDER_SITE_OTHER): Payer: BC Managed Care – PPO | Admitting: Dermatology

## 2021-11-06 DIAGNOSIS — D239 Other benign neoplasm of skin, unspecified: Secondary | ICD-10-CM

## 2021-11-06 DIAGNOSIS — D171 Benign lipomatous neoplasm of skin and subcutaneous tissue of trunk: Secondary | ICD-10-CM | POA: Diagnosis not present

## 2021-11-06 DIAGNOSIS — I872 Venous insufficiency (chronic) (peripheral): Secondary | ICD-10-CM | POA: Diagnosis not present

## 2021-11-06 DIAGNOSIS — I83811 Varicose veins of right lower extremities with pain: Secondary | ICD-10-CM

## 2021-11-06 NOTE — Progress Notes (Signed)
   Follow-Up Visit   Subjective  Judy Garcia is a 51 y.o. female who presents for the following: Nevus (R post leg, has had for yrs, recently became inflamed, now it is painful) and Varicose Veins (R leg, painful).   The following portions of the chart were reviewed this encounter and updated as appropriate:       Review of Systems:  No other skin or systemic complaints except as noted in HPI or Assessment and Plan.  Objective  Well appearing patient in no apparent distress; mood and affect are within normal limits.  A focused examination was performed including R leg. Relevant physical exam findings are noted in the Assessment and Plan.  R calf 8.35m dimpling brown macule  bil lower legs Stasis changes with hyperpigmentation, trace pitting edema  R lat thigh Tortuous bulging blue vessel  c/w varicose vein R lat thigh  L mid back Tender indistinct indurated deep nodule 6.0cm    Assessment & Plan  Dermatofibroma R calf  Benign, observe.   A dermatofibroma is a benign growth possibly related to trauma, such as an insect bite or inflamed acne-type bump.  Discussed removal (shave vrs excision) with resulting scar and risk of recurrence.  Since not bothersome, will observe for now.     Stasis dermatitis of both legs bil lower legs  Stasis in the legs causes chronic leg swelling, which may result in itchy or painful rashes, skin discoloration, skin texture changes, and sometimes ulceration.  Recommend daily graduated compression hose/stockings- easiest to put on first thing in morning, remove at bedtime.  Elevate legs as much as possible. Avoid salt/sodium rich foods.  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  Symptomatic   Pt has discussed with general surgeon in past to remove, recommend pt f/u  with him.   Return if symptoms worsen or fail to improve.   I, SOthelia Pulling RMA, am acting as scribe for TBrendolyn Patty MD .  Documentation: I have reviewed the above documentation for accuracy and completeness, and I agree with the above.  TBrendolyn PattyMD

## 2021-11-06 NOTE — Patient Instructions (Addendum)
A dermatofibroma is a benign growth possibly related to trauma, such as an insect bite or inflamed acne-type bump.  Discussed removal (shave vrs excision) with resulting scar and risk of recurrence.  Since not bothersome, will observe for now.   Stasis in the legs causes chronic leg swelling, which may result in itchy or painful rashes, skin discoloration, skin texture changes, and sometimes ulceration.  Recommend daily graduated compression hose/stockings- easiest to put on first thing in morning, remove at bedtime.  Elevate legs as much as possible. Avoid salt/sodium rich foods.   [facility]  Due to recent changes in healthcare laws, you may see results of your pathology and/or laboratory studies on MyChart before the doctors have had a chance to review them. We understand that in some cases there may be results that are confusing or concerning to you. Please understand that not all results are received at the same time and often the doctors may need to interpret multiple results in order to provide you with the best plan of care or course of treatment. Therefore, we ask that you please give [place] 2 business days to thoroughly review all your results before contacting the office for clarification. Should we see a critical lab result, you will be contacted sooner.   If You Need Anything After Your Visit  If you have any questions or concerns for your doctor, please call our main line at [phone] and press option 4 to reach your doctor's medical assistant. If no one answers, please leave a voicemail as directed and we will return your call as soon as possible. Messages left after 4 pm will be answered the following business day.   You may also send [place] a message via [place]. We typically respond to MyChart messages within 1-2 business days.  For prescription refills, please ask your pharmacy to contact our office. Our fax number is [phone].  If you have an urgent  issue when the clinic is closed that cannot wait until the next business day, you can page your doctor at the number below.    Please note that while we do our best to be available for urgent issues outside of office hours, we are not available 24/7.   If you have an urgent issue and are unable to reach [place], you may choose to seek medical care at your doctor's office, retail clinic, urgent care center, or emergency room.  If you have a medical emergency, please immediately call 911 or go to the emergency department.  Pager Numbers  - Dr. Nehemiah Massed: [phone]  - Dr. Laurence Ferrari: [phone]  - Dr. Nicole Kindred: [phone]  In the event of inclement weather, please call our main line at [phone] for an update on the status of any delays or closures.  Dermatology Medication Tips: Please keep the boxes that topical medications come in in order to help keep track of the instructions about where and how to use these. Pharmacies typically print the medication instructions only on the boxes and not directly on the medication tubes.   If your medication is too expensive, please contact our office at [phone] option 4 or send [place] a message through [place].   We are unable to tell what your co-pay for medications will be in advance as this is different depending on your insurance coverage. However, we may be able to find a substitute medication at lower cost or fill out paperwork to get insurance to cover a needed medication.   If a  prior authorization is required to get your medication covered by your insurance company, please allow [place] 1-2 business days to complete this process.  Drug prices often vary depending on where the prescription is filled and some pharmacies may offer cheaper prices.  The website www.goodrx.com contains coupons for medications through different pharmacies. The prices here do not account for what the cost may be with help from insurance (it may be cheaper with your  insurance), but the website can give you the price if you did not use any insurance.  - You can print the associated coupon and take it with your prescription to the pharmacy.  - You may also stop by our office during regular business hours and pick up a GoodRx coupon card.  - If you need your prescription sent electronically to a different pharmacy, notify our office through [facility] or by phone at [phone] option 4.     Si Usted Necesita Algo Despus de Su Visita  Tambin puede enviarnos un mensaje a travs de Pharmacist, community. Por lo general respondemos a los mensajes de MyChart en el transcurso de 1 a 2 das hbiles.  Para renovar recetas, por favor pida a su farmacia que se ponga en contacto con nuestra oficina. Harland Dingwall de fax es [place] [phone].  Si tiene un asunto urgente cuando la clnica est cerrada y que no puede esperar hasta el siguiente da hbil, puede llamar/localizar a su doctor(a) al nmero que aparece a continuacin.   Por favor, tenga en cuenta que aunque hacemos todo lo posible para estar disponibles para asuntos urgentes fuera del horario de [place], no estamos disponibles las 24 horas del da, los 7 das de la [place].   Si tiene un problema urgente y no puede comunicarse con nosotros, puede optar por buscar atencin mdica  en el consultorio de su doctor(a), en una clnica privada, en un centro de atencin urgente o en una sala de emergencias.  Si tiene Engineering geologist, por favor llame inmediatamente al 911 o vaya a la sala de emergencias.  Nmeros de bper  - Dr. Nehemiah Massed: [phone]  - Dra. Moye: [phone]  - Dra. Nicole Kindred: [phone]  En caso de inclemencias del [place], por favor llame a Johnsie Kindred principal al [phone] para una actualizacin sobre el [place] de cualquier retraso o cierre.  Consejos para la medicacin en dermatologa: Por favor, guarde las cajas en las que vienen los medicamentos de uso tpico para ayudarle a  seguir las instrucciones sobre dnde y cmo usarlos. Las farmacias generalmente imprimen las instrucciones del medicamento slo en las cajas y no directamente en los tubos del [place].   Si su medicamento es muy caro, por favor, pngase en contacto con Zigmund Daniel llamando al [phone] y presione la opcin 4 o envenos un mensaje a travs de Pharmacist, community.   No podemos decirle cul ser su copago por los medicamentos por adelantado ya que esto es diferente dependiendo de la cobertura de su seguro. Sin embargo, es posible que podamos encontrar un medicamento sustituto a Electrical engineer un formulario para que el seguro cubra el medicamento que se considera necesario.   Si se requiere una autorizacin previa para que su compaa de seguros [place] su medicamento, por favor permtanos de 1 a 2 das hbiles para completar este proceso.  Los precios de los medicamentos varan con frecuencia dependiendo del Environmental consultant de dnde se surte la receta y alguna farmacias pueden ofrecer precios ms baratos.  El sitio web www.goodrx.com tiene cupones  para medicamentos de diferentes farmacias. Los precios aqu no tienen en cuenta lo que podra costar con la ayuda del seguro (puede ser ms barato con su seguro), pero el sitio web puede darle el precio si no utiliz ningn seguro.  - Puede imprimir el cupn correspondiente y llevarlo con su receta a la farmacia.  - Tambin puede pasar por nuestra oficina durante el horario de atencin regular y recoger una tarjeta de cupones de GoodRx.  - Si necesita que su receta se enve electrnicamente a una farmacia diferente, informe a nuestra oficina a travs de MyChart de [place] o por telfono llamando al [phone] y presione la opcin 4.  

## 2021-11-24 ENCOUNTER — Other Ambulatory Visit: Payer: Self-pay | Admitting: Internal Medicine

## 2021-11-24 DIAGNOSIS — Z760 Encounter for issue of repeat prescription: Secondary | ICD-10-CM

## 2021-11-24 DIAGNOSIS — F32A Depression, unspecified: Secondary | ICD-10-CM

## 2021-12-06 ENCOUNTER — Other Ambulatory Visit: Payer: Self-pay | Admitting: Internal Medicine

## 2021-12-06 DIAGNOSIS — I10 Essential (primary) hypertension: Secondary | ICD-10-CM

## 2021-12-16 ENCOUNTER — Other Ambulatory Visit: Payer: Self-pay | Admitting: Family

## 2021-12-16 DIAGNOSIS — F32A Depression, unspecified: Secondary | ICD-10-CM

## 2021-12-16 DIAGNOSIS — Z760 Encounter for issue of repeat prescription: Secondary | ICD-10-CM

## 2021-12-16 DIAGNOSIS — F419 Anxiety disorder, unspecified: Secondary | ICD-10-CM

## 2021-12-17 ENCOUNTER — Telehealth: Payer: Self-pay | Admitting: Family Medicine

## 2021-12-17 ENCOUNTER — Encounter: Payer: Self-pay | Admitting: Family Medicine

## 2021-12-17 ENCOUNTER — Ambulatory Visit: Payer: BC Managed Care – PPO | Admitting: Family Medicine

## 2021-12-17 VITALS — BP 150/90 | HR 98 | Temp 98.1°F | Ht 61.0 in | Wt 225.6 lb

## 2021-12-17 DIAGNOSIS — E876 Hypokalemia: Secondary | ICD-10-CM

## 2021-12-17 DIAGNOSIS — F419 Anxiety disorder, unspecified: Secondary | ICD-10-CM | POA: Diagnosis not present

## 2021-12-17 DIAGNOSIS — Z8639 Personal history of other endocrine, nutritional and metabolic disease: Secondary | ICD-10-CM | POA: Diagnosis not present

## 2021-12-17 DIAGNOSIS — Z760 Encounter for issue of repeat prescription: Secondary | ICD-10-CM

## 2021-12-17 DIAGNOSIS — M799 Soft tissue disorder, unspecified: Secondary | ICD-10-CM

## 2021-12-17 DIAGNOSIS — I10 Essential (primary) hypertension: Secondary | ICD-10-CM | POA: Diagnosis not present

## 2021-12-17 DIAGNOSIS — Z1159 Encounter for screening for other viral diseases: Secondary | ICD-10-CM

## 2021-12-17 DIAGNOSIS — F17210 Nicotine dependence, cigarettes, uncomplicated: Secondary | ICD-10-CM

## 2021-12-17 DIAGNOSIS — R110 Nausea: Secondary | ICD-10-CM

## 2021-12-17 DIAGNOSIS — Z1211 Encounter for screening for malignant neoplasm of colon: Secondary | ICD-10-CM

## 2021-12-17 DIAGNOSIS — E119 Type 2 diabetes mellitus without complications: Secondary | ICD-10-CM | POA: Diagnosis not present

## 2021-12-17 DIAGNOSIS — E7800 Pure hypercholesterolemia, unspecified: Secondary | ICD-10-CM

## 2021-12-17 DIAGNOSIS — F1911 Other psychoactive substance abuse, in remission: Secondary | ICD-10-CM

## 2021-12-17 DIAGNOSIS — J9801 Acute bronchospasm: Secondary | ICD-10-CM

## 2021-12-17 DIAGNOSIS — Z114 Encounter for screening for human immunodeficiency virus [HIV]: Secondary | ICD-10-CM

## 2021-12-17 DIAGNOSIS — F32A Depression, unspecified: Secondary | ICD-10-CM

## 2021-12-17 LAB — COMPREHENSIVE METABOLIC PANEL
ALT: 20 U/L (ref 0–35)
AST: 17 U/L (ref 0–37)
Albumin: 4.3 g/dL (ref 3.5–5.2)
Alkaline Phosphatase: 102 U/L (ref 39–117)
BUN: 16 mg/dL (ref 6–23)
CO2: 30 mEq/L (ref 19–32)
Calcium: 9.3 mg/dL (ref 8.4–10.5)
Chloride: 102 mEq/L (ref 96–112)
Creatinine, Ser: 0.61 mg/dL (ref 0.40–1.20)
GFR: 102 mL/min (ref >60.00)
Glucose, Bld: 101 mg/dL — ABNORMAL HIGH (ref 70–99)
Potassium: 4 mEq/L (ref 3.5–5.1)
Sodium: 140 mEq/L (ref 135–145)
Total Bilirubin: 0.2 mg/dL (ref 0.2–1.2)
Total Protein: 6.6 g/dL (ref 6.0–8.3)

## 2021-12-17 LAB — TSH: TSH: 2.46 u[IU]/mL (ref 0.35–5.50)

## 2021-12-17 LAB — LIPID PANEL
Cholesterol: 209 mg/dL — ABNORMAL HIGH (ref 0–200)
HDL: 64.1 mg/dL (ref >39.00)
LDL Cholesterol: 123 mg/dL — ABNORMAL HIGH (ref 0–99)
NonHDL: 145.01
Total CHOL/HDL Ratio: 3
Triglycerides: 112 mg/dL (ref 0.0–149.0)
VLDL: 22.4 mg/dL (ref 0.0–40.0)

## 2021-12-17 LAB — VITAMIN D 25 HYDROXY (VIT D DEFICIENCY, FRACTURES): VITD: 12.98 ng/mL — ABNORMAL LOW (ref 30.00–100.00)

## 2021-12-17 LAB — HEMOGLOBIN A1C: Hgb A1c MFr Bld: 6.2 % (ref 4.6–6.5)

## 2021-12-17 MED ORDER — FLUTICASONE FUROATE-VILANTEROL 100-25 MCG/ACT IN AEPB
1.0000 | INHALATION_SPRAY | Freq: Every day | RESPIRATORY_TRACT | 11 refills | Status: DC
Start: 2021-12-17 — End: 2022-09-15

## 2021-12-17 MED ORDER — BUSPIRONE HCL 7.5 MG PO TABS: 7.5000 mg | ORAL_TABLET | Freq: Two times a day (BID) | ORAL | 3 refills | Status: DC

## 2021-12-17 MED ORDER — METFORMIN HCL ER 500 MG PO TB24: 500.0000 mg | ORAL_TABLET | Freq: Two times a day (BID) | ORAL | 3 refills | Status: DC

## 2021-12-17 MED ORDER — ALBUTEROL SULFATE HFA 108 (90 BASE) MCG/ACT IN AERS: INHALATION_SPRAY | RESPIRATORY_TRACT | 0 refills | Status: AC

## 2021-12-17 MED ORDER — SPIRONOLACTONE 25 MG PO TABS: 12.5000 mg | ORAL_TABLET | Freq: Every day | ORAL | 1 refills | Status: DC

## 2021-12-17 MED ORDER — VALSARTAN 80 MG PO TABS: 80.0000 mg | ORAL_TABLET | Freq: Every day | ORAL | 3 refills | Status: DC

## 2021-12-17 MED ORDER — ONDANSETRON HCL 4 MG PO TABS: 4.0000 mg | ORAL_TABLET | Freq: Three times a day (TID) | ORAL | 0 refills | Status: DC | PRN

## 2021-12-17 MED ORDER — VENLAFAXINE HCL ER 75 MG PO CP24: ORAL_CAPSULE | ORAL | 1 refills | Status: DC

## 2021-12-17 NOTE — Assessment & Plan Note (Signed)
Above goal.  We will transition her from losartan to valsartan 80 mg once daily.  She will continue spironolactone 12.5 mg daily.  We will check lab work today and in 1 week.  She will follow-up with me in 1 month.

## 2021-12-17 NOTE — Assessment & Plan Note (Signed)
Encouraged the patient to add in exercise with walking 3 days a week.  I encouraged continued healthy diet.

## 2021-12-17 NOTE — Progress Notes (Signed)
.  tran

## 2021-12-17 NOTE — Progress Notes (Signed)
Tommi Rumps, MD Phone: [phone]  Judy Garcia is a 51 y.o. female who presents today for f/u.  HYPERTENSION Disease Monitoring Home BP Monitoring 130s/70s up to 150s/90s Chest pain- no    Dyspnea- no Medications Compliance-  taking losartan, spironolactone, notes she was previously on maxzide.  Edema- no BMET    Component Value Date/Time   NA 139 04/19/2021 1401   K 3.1 (L) 04/19/2021 1401   CL 100 04/19/2021 1401   CO2 28 04/19/2021 1401   GLUCOSE 224 (H) 04/19/2021 1401   BUN 15 04/19/2021 1401   CREATININE 0.66 04/19/2021 1401   CALCIUM 9.1 04/19/2021 1401   GFRNONAA >60 06/21/2020 1155   GFRAA >60 04/13/2018 1651   DIABETES Disease Monitoring: Blood Sugar ranges-90-102, rarely up to 121 Polyuria/phagia/dipsia- no      Medications: Compliance- taking metformin, notes frequent diarrhea with this  Lipoma: Patient notes she saw a surgeon previously though they were out of network.  She continues to have some discomfort with a lipoma.  She notes the surgeon discussed having her go to the OR to have this removed.  She wants to see a Museum/gallery conservator.  Tobacco abuse: She continues to smoke.  She notes she quit for 15 years though has been back to smoking for 12 years.  She has been thinking about quitting though is not 100% sure about this yet.  She notes she has nicotine patches and gum at home.  She quit previously with Wellbutrin.  She had abnormal dreams with Chantix.  Anxiety/depression: Patient notes that depression is well controlled with Effexor.  She notes no SI.  She has anxiety most days.  She meditates and does lifestyle changes to help with the anxiety.  She notes she took buspirone for about 30 days previously and did not have any side effects with this.  Obesity: Patient has significantly reduced pasta and other carbohydrate intake as well as sweet intake since finding out she was diabetic.  She is not exercising much.   Social History   Tobacco Use   Smoking Status Every Day   Packs/day: 0.50   Years: 25.00   Total pack years: 12.50   Types: Cigarettes  Smokeless Tobacco Never    Current Outpatient Medications on File Prior to Visit  Medication Sig Dispense Refill   Ascorbic Acid (VITAMIN C) 100 MG tablet Take 100 mg by mouth as needed.     azelastine (ASTELIN) 0.1 % nasal spray Place into the nose.     blood glucose meter kit and supplies Dispense based on patient and insurance preference. Use up to four times daily as directed. (FOR ICD-10 E10.9, E11.9). 1 each 0   docusate sodium (COLACE) 100 MG capsule Take 1 capsule (100 mg total) by mouth daily. 90 capsule 1   Multiple Vitamin (MULTIVITAMIN) tablet Take 1 tablet by mouth daily.     PROAIR RESPICLICK 283 (90 Base) MCG/ACT AEPB INHALE 2 PUFFS INTO THE LUNGS FOUR TIMES DAILY AS NEEDED 1 each 0   nicotine (NICODERM CQ - DOSED IN MG/24 HOURS) 21 mg/24hr patch Place 1 patch (21 mg total) onto the skin daily. (Patient not taking: Reported on 12/17/2021) 28 patch 3   nicotine polacrilex (NICORETTE) 4 MG gum Take 1 each (4 mg total) by mouth as needed for smoking cessation. (Patient not taking: Reported on 12/17/2021) 100 tablet 3   No current facility-administered medications on file prior to visit.     ROS see history of present illness  Objective  Physical Exam Vitals:   12/17/21 0941  BP: (!) 150/90  Pulse: 98  Temp: 98.1 F (36.7 C)  SpO2: 96%    BP Readings from Last 3 Encounters:  12/17/21 (!) 150/90  05/30/21 132/78  04/30/21 120/76   Wt Readings from Last 3 Encounters:  12/17/21 225 lb 9.6 oz (102.3 kg)  06/12/21 241 lb (109.3 kg)  06/07/21 241 lb (109.3 kg)    Physical Exam Constitutional:      General: She is not in acute distress.    Appearance: She is not diaphoretic.  Cardiovascular:     Rate and Rhythm: Normal rate and regular rhythm.     Heart sounds: Normal heart sounds.  Pulmonary:     Effort: Pulmonary effort is normal.     Breath sounds:  Normal breath sounds.  Skin:    General: Skin is warm and dry.  Neurological:     Mental Status: She is alert.      Assessment/Plan: Please see individual problem list.  Problem List Items Addressed This Visit     Anxiety and depression (Chronic)    Depression is well controlled though anxiety is not.  I will add buspirone 7.5 mg twice daily.  Discussed that it may take at least 2 months for her to notice a big difference.  Advised we can increase the dose at 1 month follow-up if its not beneficial.      Relevant Medications   busPIRone (BUSPAR) 7.5 MG tablet   venlafaxine XR (EFFEXOR-XR) 75 MG 24 hr capsule   Essential hypertension (Chronic)    Above goal.  We will transition her from losartan to valsartan 80 mg once daily.  She will continue spironolactone 12.5 mg daily.  We will check lab work today and in 1 week.  She will follow-up with me in 1 month.      Relevant Medications   valsartan (DIOVAN) 80 MG tablet   spironolactone (ALDACTONE) 25 MG tablet   Other Relevant Orders   Comp Met (CMET)   Lipid panel   TSH   Basic Metabolic Panel (BMET)   Nicotine dependence, cigarettes, uncomplicated (Chronic)    Smoking cessation counseling was provided.  Approximately 4 minutes were spent discussing the rationale for tobacco cessation and strategies for doing so.  Adjuncts, including nicotine patches, nicotine lozenges, varenicline and buproprion were discussed.  I recommended nicotine patches and lozenges images given the potential increase in anxiety with bupropion.  She has not tolerated varenicline in the past.  I encouraged her to pick a quit date and try to stick with it.  Follow-up in 3 months regarding this.       Type 2 diabetes mellitus without complication, without long-term current use of insulin (HCC) - Primary (Chronic)    We will transition the patient to metformin XR 500 mg twice daily with meals to see if that is better for her GI issues.  If its not we could  always consider an alternative medication.  Check A1c today.      Relevant Medications   metFORMIN (GLUCOPHAGE-XR) 500 MG 24 hr tablet   valsartan (DIOVAN) 80 MG tablet   Other Relevant Orders   HgB A1c   History of substance abuse ([place])    Patient notes she has abstained from substance abuse for many decades.  She continues with Narcotics Anonymous.      Morbid obesity ([place])    Encouraged the patient to add in exercise with walking 3 days a week.  I  encouraged continued healthy diet.      Relevant Medications   metFORMIN (GLUCOPHAGE-XR) 500 MG 24 hr tablet   Soft tissue lesion    Refer to a new Psychologist, sport and exercise.      Relevant Orders   Ambulatory referral to General Surgery   Other Visit Diagnoses     Need for hepatitis C screening test       Relevant Orders   Hepatitis C Antibody   Encounter for screening for HIV       Relevant Orders   HIV antibody (with reflex)   H/O vitamin D deficiency       Relevant Orders   Vitamin D (25 hydroxy)   Colon cancer screening       Relevant Orders   Ambulatory referral to Gastroenterology   Bronchospasm       Relevant Medications   albuterol (VENTOLIN HFA) 108 (90 Base) MCG/ACT inhaler   Nausea       Relevant Medications   ondansetron (ZOFRAN) 4 MG tablet   Encounter for medication refill       Relevant Medications   venlafaxine XR (EFFEXOR-XR) 75 MG 24 hr capsule   Depressive disorder       Relevant Medications   busPIRone (BUSPAR) 7.5 MG tablet   venlafaxine XR (EFFEXOR-XR) 75 MG 24 hr capsule      Health maintenance: Patient was referred for colonoscopy.  Return in about 1 week (around 12/24/2021) for Labs, 1 month PCP for blood pressure and anxiety.  I have spent 45 minutes in the care of this patient regarding history taking, documentation, completion of exam, discussion of plan, placing orders.   Tommi Rumps, MD [place]

## 2021-12-17 NOTE — Assessment & Plan Note (Signed)
Smoking cessation counseling was provided.  Approximately 4 minutes were spent discussing the rationale for tobacco cessation and strategies for doing so.  Adjuncts, including nicotine patches, nicotine lozenges, varenicline and buproprion were discussed.  I recommended nicotine patches and lozenges images given the potential increase in anxiety with bupropion.  She has not tolerated varenicline in the past.  I encouraged her to pick a quit date and try to stick with it.  Follow-up in 3 months regarding this.

## 2021-12-17 NOTE — Assessment & Plan Note (Signed)
Refer to a new Psychologist, sport and exercise.

## 2021-12-17 NOTE — Telephone Encounter (Signed)
LVM for patient to call back.   Judy Garcia,cma  

## 2021-12-17 NOTE — Telephone Encounter (Signed)
Please call the patient.  I noticed after she left that she has an allergy to lisinopril.  Lists swelling as the allergy.  Can you find out where that swelling was?  Thanks.

## 2021-12-17 NOTE — Assessment & Plan Note (Signed)
Patient notes she has abstained from substance abuse for many decades.  She continues with Narcotics Anonymous.

## 2021-12-17 NOTE — Assessment & Plan Note (Signed)
Depression is well controlled though anxiety is not.  I will add buspirone 7.5 mg twice daily.  Discussed that it may take at least 2 months for her to notice a big difference.  Advised we can increase the dose at 1 month follow-up if its not beneficial.

## 2021-12-17 NOTE — Assessment & Plan Note (Signed)
We will transition the patient to metformin XR 500 mg twice daily with meals to see if that is better for her GI issues.  If its not we could always consider an alternative medication.  Check A1c today.

## 2021-12-17 NOTE — Patient Instructions (Signed)
Nice to see you. We will get labs today. We are changing your losartan to valsartan. You will have labs in one week as well. I will see you back in 1 month to recheck.  We will switch you metformin to the extended release version. If you still have diarrhea with this please let me know.  Please quit smoking. We will start you on BuSpar for your anxiety.  This may take several months to become beneficial.  If you have any side effects please let me know.

## 2021-12-18 LAB — HEPATITIS C ANTIBODY: Hepatitis C Ab: NONREACTIVE

## 2021-12-18 LAB — HIV ANTIBODY (ROUTINE TESTING W REFLEX): HIV 1&2 Ab, 4th Generation: NONREACTIVE

## 2021-12-18 NOTE — Telephone Encounter (Signed)
LVM for patient to call back.   Genesia Caslin,cma  

## 2021-12-19 ENCOUNTER — Telehealth: Payer: Self-pay

## 2021-12-19 NOTE — Telephone Encounter (Signed)
Lvm to return call to go over results.

## 2021-12-19 NOTE — Telephone Encounter (Signed)
LVM to call back to office  

## 2021-12-20 ENCOUNTER — Encounter: Payer: Self-pay | Admitting: *Deleted

## 2021-12-24 ENCOUNTER — Other Ambulatory Visit: Payer: BC Managed Care – PPO

## 2021-12-24 MED ORDER — AMLODIPINE BESYLATE 5 MG PO TABS: 5.0000 mg | ORAL_TABLET | Freq: Every day | ORAL | 0 refills | Status: DC

## 2021-12-24 MED ORDER — ROSUVASTATIN CALCIUM 20 MG PO TABS: 20.0000 mg | ORAL_TABLET | Freq: Every day | ORAL | 3 refills | Status: DC

## 2021-12-24 NOTE — Telephone Encounter (Signed)
I called and spoke with the patient and informed her that the provider wants to change her BP medication to amlodipine she understood and I sent in the prescription to to her pharmacy.  Daronte Shostak,cma

## 2021-12-24 NOTE — Telephone Encounter (Signed)
I called the patient 3 times and I sent a message on mychart, today.  Darbi Chandran,cma

## 2021-12-24 NOTE — Addendum Note (Signed)
Addended by: Fulton Mole D on: 12/24/2021 04:36 PM   Modules accepted: Orders

## 2021-12-24 NOTE — Telephone Encounter (Signed)
I just spoke with the patient today and she stated that the Lisinopril cause her entire mouth to swell.  Jesse Hirst,cma

## 2021-12-24 NOTE — Addendum Note (Signed)
Addended by: Fulton Mole D on: 12/24/2021 03:22 PM   Modules accepted: Orders

## 2021-12-25 ENCOUNTER — Other Ambulatory Visit: Payer: Self-pay | Admitting: Family Medicine

## 2021-12-25 DIAGNOSIS — E785 Hyperlipidemia, unspecified: Secondary | ICD-10-CM

## 2021-12-25 NOTE — Addendum Note (Signed)
Addended by: Caryl Bis, Mynor Witkop G on: 12/25/2021 01:07 PM   Modules accepted: Orders

## 2021-12-25 NOTE — Telephone Encounter (Signed)
Noted. Can you please confirm that she knows to stop the valsartan completely?

## 2022-01-15 ENCOUNTER — Encounter: Payer: Self-pay | Admitting: Family Medicine

## 2022-01-15 ENCOUNTER — Ambulatory Visit (INDEPENDENT_AMBULATORY_CARE_PROVIDER_SITE_OTHER): Payer: BC Managed Care – PPO

## 2022-01-15 ENCOUNTER — Ambulatory Visit: Payer: BC Managed Care – PPO | Admitting: Family Medicine

## 2022-01-15 DIAGNOSIS — M25562 Pain in left knee: Secondary | ICD-10-CM

## 2022-01-15 DIAGNOSIS — F419 Anxiety disorder, unspecified: Secondary | ICD-10-CM

## 2022-01-15 DIAGNOSIS — B351 Tinea unguium: Secondary | ICD-10-CM | POA: Diagnosis not present

## 2022-01-15 DIAGNOSIS — F32A Depression, unspecified: Secondary | ICD-10-CM

## 2022-01-15 DIAGNOSIS — I10 Essential (primary) hypertension: Secondary | ICD-10-CM | POA: Diagnosis not present

## 2022-01-15 DIAGNOSIS — L84 Corns and callosities: Secondary | ICD-10-CM

## 2022-01-15 MED ORDER — AMLODIPINE BESYLATE 10 MG PO TABS: 10.0000 mg | ORAL_TABLET | Freq: Every day | ORAL | 1 refills | Status: DC
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
## 2022-01-15 NOTE — Assessment & Plan Note (Signed)
Blood pressure has improved since her last visit though does remain above goal.  We will increase her amlodipine to 10 mg daily.  She will continue spironolactone 12.5 mg daily.

## 2022-01-15 NOTE — Assessment & Plan Note (Signed)
Discussed the area on her right foot appears to be a callus.  She can use over-the-counter salicylic acid callus pads.  Discussed the option of seeing podiatry though she opted to use the pads first and if not improving we could refer her.

## 2022-01-15 NOTE — Assessment & Plan Note (Signed)
Likely related to strain of her medial distal hamstring and irritation of the pes anserine bursa given the area of her discomfort on exam.  Given her inability to walk after the event we will get an x-ray to evaluate for any underlying fracture.

## 2022-01-15 NOTE — Assessment & Plan Note (Signed)
Discussed potential treatment options.  We jointly opted to have her continue the over-the-counter treatment since it seems to be working.  Discussed it may take a year for this to fully work.

## 2022-01-15 NOTE — Assessment & Plan Note (Signed)
Somewhat better.  I discussed the option of increasing the BuSpar though she opted to stay on BuSpar 7.5 mg twice daily and Effexor 225 mg daily.  She will let me know if her anxiety does not continue to improve over the next month.

## 2022-01-15 NOTE — Patient Instructions (Signed)
Nice to see you. We will contact you with your x-ray results. We will increase your amlodipine to 10 mg daily.  Please let me know if your blood pressure does not trend down to less than 130/80 over the next several weeks.

## 2022-01-19 ENCOUNTER — Other Ambulatory Visit: Payer: Self-pay | Admitting: Family

## 2022-01-19 MED ORDER — DICLOFENAC SODIUM 1 % EX GEL: 4.0000 g | Freq: Four times a day (QID) | CUTANEOUS | 0 refills | Status: DC

## 2022-01-20 ENCOUNTER — Telehealth: Payer: Self-pay

## 2022-01-20 NOTE — Telephone Encounter (Signed)
Called and spoke with pt in regards to work note. I told pt it was available for pick pt stated she would be by to drop off FMLA papers and will pick up her work note as well.  Note is in envelope up front with pts name on it.

## 2022-01-21 ENCOUNTER — Other Ambulatory Visit: Payer: Self-pay

## 2022-01-21 DIAGNOSIS — E119 Type 2 diabetes mellitus without complications: Secondary | ICD-10-CM

## 2022-01-21 MED ORDER — ACCU-CHEK SOFTCLIX LANCETS MISC: 12 refills | Status: AC

## 2022-01-21 MED ORDER — ACCU-CHEK GUIDE VI STRP: ORAL_STRIP | 12 refills | Status: AC

## 2022-02-04 ENCOUNTER — Other Ambulatory Visit: Payer: BC Managed Care – PPO

## 2022-03-06 ENCOUNTER — Other Ambulatory Visit: Payer: Self-pay | Admitting: Internal Medicine

## 2022-03-06 DIAGNOSIS — I10 Essential (primary) hypertension: Secondary | ICD-10-CM

## 2022-03-31 ENCOUNTER — Encounter (INDEPENDENT_AMBULATORY_CARE_PROVIDER_SITE_OTHER): Payer: Self-pay

## 2022-04-19 ENCOUNTER — Telehealth: Payer: BC Managed Care – PPO | Admitting: Nurse Practitioner

## 2022-04-19 DIAGNOSIS — J40 Bronchitis, not specified as acute or chronic: Secondary | ICD-10-CM

## 2022-04-19 MED ORDER — GUAIFENESIN ER 600 MG PO TB12: 600.0000 mg | ORAL_TABLET | Freq: Two times a day (BID) | ORAL | 0 refills | Status: DC

## 2022-04-19 MED ORDER — AZITHROMYCIN 250 MG PO TABS: ORAL_TABLET | ORAL | 0 refills | Status: AC

## 2022-04-19 MED ORDER — PREDNISONE 20 MG PO TABS: 20.0000 mg | ORAL_TABLET | Freq: Every day | ORAL | 0 refills | Status: DC

## 2022-04-19 NOTE — Patient Instructions (Signed)
Judy Garcia, thank you for joining Gildardo Pounds, NP for today's virtual visit.  While this provider is not your primary care provider (PCP), if your PCP is located in our provider database this encounter information will be shared with them immediately following your visit.   [facility] account gives you access to today's visit and all your visits, tests, and labs performed at [facility] " click here if you don't have a [facility] account or go to [email or web address]  Consent: (Patient) Judy Garcia provided verbal consent for this virtual visit at the beginning of the encounter.  Current Medications:  Current Outpatient Medications:    azithromycin (ZITHROMAX) 250 MG tablet, Take 2 tablets on day 1, then 1 tablet daily on days 2 through 5, Disp: 6 tablet, Rfl: 0   guaiFENesin (MUCINEX) 600 MG 12 hr tablet, Take 1 tablet (600 mg total) by mouth 2 (two) times daily., Disp: 30 tablet, Rfl: 0   predniSONE (DELTASONE) 20 MG tablet, Take 1 tablet (20 mg total) by mouth daily with breakfast., Disp: 5 tablet, Rfl: 0   Accu-Chek Softclix Lancets lancets, Use as instructed, Disp: 100 each, Rfl: 12   albuterol (VENTOLIN HFA) 108 (90 Base) MCG/ACT inhaler, INHALE 1 TO 2 PUFFS INTO THE LUNGS EVERY 6 HOURS AS NEEDED FOR WHEEZING OR SHORTNESS OF BREATH, Disp: 54 g, Rfl: 0   amLODipine (NORVASC) 10 MG tablet, Take 1 tablet (10 mg total) by mouth daily., Disp: 90 tablet, Rfl: 1   Ascorbic Acid (VITAMIN C) 100 MG tablet, Take 100 mg by mouth as needed., Disp: , Rfl:    azelastine (ASTELIN) 0.1 % nasal spray, Place into the nose., Disp: , Rfl:    blood glucose meter kit and supplies, Dispense based on patient and insurance preference. Use up to four times daily as directed. (FOR ICD-10 E10.9, E11.9)., Disp: 1 each, Rfl: 0   busPIRone (BUSPAR) 7.5 MG tablet, Take 1 tablet (7.5 mg total) by mouth 2 (two) times daily., Disp: 60 tablet, Rfl: 3   diclofenac Sodium  (VOLTAREN) 1 % GEL, Apply 4 g topically 4 (four) times daily., Disp: 100 g, Rfl: 0   docusate sodium (COLACE) 100 MG capsule, Take 1 capsule (100 mg total) by mouth daily., Disp: 90 capsule, Rfl: 1   fluticasone furoate-vilanterol (BREO ELLIPTA) 100-25 MCG/ACT AEPB, Inhale 1 puff into the lungs daily., Disp: 1 each, Rfl: 11   glucose blood (ACCU-CHEK GUIDE) test strip, Use as instructed, Disp: 100 each, Rfl: 12   metFORMIN (GLUCOPHAGE-XR) 500 MG 24 hr tablet, Take 1 tablet (500 mg total) by mouth 2 (two) times daily with a meal., Disp: 60 tablet, Rfl: 3   Multiple Vitamin (MULTIVITAMIN) tablet, Take 1 tablet by mouth daily., Disp: , Rfl:    nicotine (NICODERM CQ - DOSED IN MG/24 HOURS) 21 mg/24hr patch, Place 1 patch (21 mg total) onto the skin daily., Disp: 28 patch, Rfl: 3   nicotine polacrilex (NICORETTE) 4 MG gum, Take 1 each (4 mg total) by mouth as needed for smoking cessation., Disp: 100 tablet, Rfl: 3   ondansetron (ZOFRAN) 4 MG tablet, Take 1 tablet (4 mg total) by mouth every 8 (eight) hours as needed for nausea or vomiting., Disp: 20 tablet, Rfl: 0   PROAIR RESPICLICK 485 (90 Base) MCG/ACT AEPB, INHALE 2 PUFFS INTO THE LUNGS FOUR TIMES DAILY AS NEEDED, Disp: 1 each, Rfl: 0   rosuvastatin (CRESTOR) 20 MG tablet, Take 1 tablet (20 mg total) by mouth daily.,  Disp: 90 tablet, Rfl: 3   spironolactone (ALDACTONE) 25 MG tablet, Take 0.5 tablets (12.5 mg total) by mouth daily., Disp: 45 tablet, Rfl: 1   venlafaxine XR (EFFEXOR-XR) 75 MG 24 hr capsule, TAKE 3 CAPSULES(225 MG) BY MOUTH DAILY WITH BREAKFAST FOR 7 DAYS, Disp: 270 capsule, Rfl: 1   Medications ordered in this encounter:  Meds ordered this encounter  Medications   azithromycin (ZITHROMAX) 250 MG tablet    Sig: Take 2 tablets on day 1, then 1 tablet daily on days 2 through 5    Dispense:  6 tablet    Refill:  0    Order Specific Question:   Supervising Provider    Answer:   Chase Picket [[id number]]   predniSONE (DELTASONE) 20  MG tablet    Sig: Take 1 tablet (20 mg total) by mouth daily with breakfast.    Dispense:  5 tablet    Refill:  0    Order Specific Question:   Supervising Provider    Answer:   Chase Picket [[id number]]   guaiFENesin (MUCINEX) 600 MG 12 hr tablet    Sig: Take 1 tablet (600 mg total) by mouth 2 (two) times daily.    Dispense:  30 tablet    Refill:  0    Order Specific Question:   Supervising Provider    Answer:   Chase Picket [id number]     *If you need refills on other medications prior to your next appointment, please contact your pharmacy*  Follow-Up: Call back or seek an in-person evaluation if the symptoms worsen or if the condition fails to improve as anticipated.  [facility] [phone]  Other Instructions Contact PCP to see if you qualify for a nebulizer machine INSTRUCTIONS: use a humidifier for nasal congestion Drink plenty of fluids, rest and wash hands frequently to avoid the spread of infection Alternate tylenol and Motrin for relief of fever     If you have been instructed to have an in-person evaluation today at a local Urgent Care facility, please use the link below. It will take you to a list of all of our available [facility], including address, phone number and hours of operation. Please do not delay care.  [facility]  If you or a family member do not have a primary care provider, use the link below to schedule a visit and establish care. When you choose a [facility] primary care physician or advanced practice provider, you gain a long-term partner in health. Find a Primary Care Provider  Learn more about 's in-office and virtual care options: [facility]

## 2022-04-19 NOTE — Progress Notes (Signed)
Virtual Visit Consent   Judy Garcia, you are scheduled for a virtual visit with a [facility] provider today. Just as with appointments in the office, your consent must be obtained to participate. Your consent will be active for this visit and any virtual visit you may have with one of our providers in the next 365 days. If you have a MyChart account, a copy of this consent can be sent to you electronically.  As this is a virtual visit, video technology does not allow for your provider to perform a traditional examination. This may limit your provider's ability to fully assess your condition. If your provider identifies any concerns that need to be evaluated in person or the need to arrange testing (such as labs, EKG, etc.), we will make arrangements to do so. Although advances in technology are sophisticated, we cannot ensure that it will always work on either your end or our end. If the connection with a video visit is poor, the visit may have to be switched to a telephone visit. With either a video or telephone visit, we are not always able to ensure that we have a secure connection.  By engaging in this virtual visit, you consent to the provision of healthcare and authorize for your insurance to be billed (if applicable) for the services provided during this visit. Depending on your insurance coverage, you may receive a charge related to this service.  I need to obtain your verbal consent now. Are you willing to proceed with your visit today? Holleigh Crihfield has provided verbal consent on 04/19/2022 for a virtual visit (video or telephone). Gildardo Pounds, NP  Date: 04/19/2022 10:44 AM  Virtual Visit via Video Note   I, Gildardo Pounds, connected with  [name]  ([id number], 1970/09/09) on 04/19/22 at 10:30 AM EST by a video-enabled telemedicine application and verified that I am speaking with the correct person using two identifiers.  Location: Patient: Virtual Visit Location Patient:  Home Provider: Virtual Visit Location Provider: Home Office   I discussed the limitations of evaluation and management by telemedicine and the availability of in person appointments. The patient expressed understanding and agreed to proceed.    History of Present Illness: Judy Garcia is a 51 y.o. who identifies as a female who was assigned female at birth, and is being seen today for bronchitis.  Acute Bronchitis: Patient presents for presents evaluation of dyspnea, nasal congestion, nonproductive cough, and hoarseness. She had fever and chills initially but both resolved with tylenol and theraflu . Symptoms began over 1 week ago and are unchanged since that time.  Past history is significant for chronic bronchitis and tobacco dependence .  She states her recent COVID test was negative.    Problems:  Patient Active Problem List   Diagnosis Date Noted   Left knee pain 01/15/2022   Foot callus 01/15/2022   Type 2 diabetes mellitus without complication, without long-term current use of insulin ([facility]) 12/17/2021   Hip pain, right 04/15/2021   Acute pain of right knee 04/15/2021   Leg swelling 04/15/2021   Fall 04/15/2021   Atypical mole 04/15/2021   Soft tissue lesion 01/29/2021   Epistaxis 01/29/2021   Ear fullness 01/29/2021   Abnormal partial thromboplastin time (PTT) 01/29/2021   Chronic bronchitis ([place]) 01/02/2021   Anxiety and depression 01/02/2021   Sleep disturbance 08/11/2019   Back pain 08/09/2019   History of lipoma 08/09/2019   Lipoma of face 07/28/2018   Onychomycosis 07/28/2018   Essential  hypertension 07/28/2018   History of alcohol abuse 07/28/2018   Menopausal symptoms 07/28/2018   History of substance abuse ([place]) 01/17/2015   Morbid obesity ([place]) 05/11/2014   Nicotine dependence, cigarettes, uncomplicated 42/87/6811   Generalized hyperhidrosis 09/14/2012   Leiomyoma of uterus 10/30/2010    Allergies:  Allergies  Allergen Reactions   Lisinopril Swelling     Angioedema. ng   Medications:  Current Outpatient Medications:    azithromycin (ZITHROMAX) 250 MG tablet, Take 2 tablets on day 1, then 1 tablet daily on days 2 through 5, Disp: 6 tablet, Rfl: 0   guaiFENesin (MUCINEX) 600 MG 12 hr tablet, Take 1 tablet (600 mg total) by mouth 2 (two) times daily., Disp: 30 tablet, Rfl: 0   predniSONE (DELTASONE) 20 MG tablet, Take 1 tablet (20 mg total) by mouth daily with breakfast., Disp: 5 tablet, Rfl: 0   Accu-Chek Softclix Lancets lancets, Use as instructed, Disp: 100 each, Rfl: 12   albuterol (VENTOLIN HFA) 108 (90 Base) MCG/ACT inhaler, INHALE 1 TO 2 PUFFS INTO THE LUNGS EVERY 6 HOURS AS NEEDED FOR WHEEZING OR SHORTNESS OF BREATH, Disp: 54 g, Rfl: 0   amLODipine (NORVASC) 10 MG tablet, Take 1 tablet (10 mg total) by mouth daily., Disp: 90 tablet, Rfl: 1   Ascorbic Acid (VITAMIN C) 100 MG tablet, Take 100 mg by mouth as needed., Disp: , Rfl:    azelastine (ASTELIN) 0.1 % nasal spray, Place into the nose., Disp: , Rfl:    blood glucose meter kit and supplies, Dispense based on patient and insurance preference. Use up to four times daily as directed. (FOR ICD-10 E10.9, E11.9)., Disp: 1 each, Rfl: 0   busPIRone (BUSPAR) 7.5 MG tablet, Take 1 tablet (7.5 mg total) by mouth 2 (two) times daily., Disp: 60 tablet, Rfl: 3   diclofenac Sodium (VOLTAREN) 1 % GEL, Apply 4 g topically 4 (four) times daily., Disp: 100 g, Rfl: 0   docusate sodium (COLACE) 100 MG capsule, Take 1 capsule (100 mg total) by mouth daily., Disp: 90 capsule, Rfl: 1   fluticasone furoate-vilanterol (BREO ELLIPTA) 100-25 MCG/ACT AEPB, Inhale 1 puff into the lungs daily., Disp: 1 each, Rfl: 11   glucose blood (ACCU-CHEK GUIDE) test strip, Use as instructed, Disp: 100 each, Rfl: 12   metFORMIN (GLUCOPHAGE-XR) 500 MG 24 hr tablet, Take 1 tablet (500 mg total) by mouth 2 (two) times daily with a meal., Disp: 60 tablet, Rfl: 3   Multiple Vitamin (MULTIVITAMIN) tablet, Take 1 tablet by mouth daily.,  Disp: , Rfl:    nicotine (NICODERM CQ - DOSED IN MG/24 HOURS) 21 mg/24hr patch, Place 1 patch (21 mg total) onto the skin daily., Disp: 28 patch, Rfl: 3   nicotine polacrilex (NICORETTE) 4 MG gum, Take 1 each (4 mg total) by mouth as needed for smoking cessation., Disp: 100 tablet, Rfl: 3   ondansetron (ZOFRAN) 4 MG tablet, Take 1 tablet (4 mg total) by mouth every 8 (eight) hours as needed for nausea or vomiting., Disp: 20 tablet, Rfl: 0   PROAIR RESPICLICK 572 (90 Base) MCG/ACT AEPB, INHALE 2 PUFFS INTO THE LUNGS FOUR TIMES DAILY AS NEEDED, Disp: 1 each, Rfl: 0   rosuvastatin (CRESTOR) 20 MG tablet, Take 1 tablet (20 mg total) by mouth daily., Disp: 90 tablet, Rfl: 3   spironolactone (ALDACTONE) 25 MG tablet, Take 0.5 tablets (12.5 mg total) by mouth daily., Disp: 45 tablet, Rfl: 1   venlafaxine XR (EFFEXOR-XR) 75 MG 24 hr capsule, TAKE 3 CAPSULES(225  MG) BY MOUTH DAILY WITH BREAKFAST FOR 7 DAYS, Disp: 270 capsule, Rfl: 1  Observations/Objective: Patient is well-developed, well-nourished in no acute distress.  Resting comfortably at home.  Head is normocephalic, atraumatic.  No labored breathing.  Speech is clear and coherent with logical content.  Patient is alert and oriented at baseline.    Assessment and Plan: 1. Bronchitis - azithromycin (ZITHROMAX) 250 MG tablet; Take 2 tablets on day 1, then 1 tablet daily on days 2 through 5  Dispense: 6 tablet; Refill: 0 - predniSONE (DELTASONE) 20 MG tablet; Take 1 tablet (20 mg total) by mouth daily with breakfast.  Dispense: 5 tablet; Refill: 0 - guaiFENesin (MUCINEX) 600 MG 12 hr tablet; Take 1 tablet (600 mg total) by mouth 2 (two) times daily.  Dispense: 30 tablet; Refill: 0  Contact PCP to see if you qualify for a nebulizer machine INSTRUCTIONS: use a humidifier for nasal congestion Drink plenty of fluids, rest and wash hands frequently to avoid the spread of infection Alternate tylenol and Motrin for relief of fever   Follow Up  Instructions: I discussed the assessment and treatment plan with the patient. The patient was provided an opportunity to ask questions and all were answered. The patient agreed with the plan and demonstrated an understanding of the instructions.  A copy of instructions were sent to the patient via MyChart unless otherwise noted below.    The patient was advised to call back or seek an in-person evaluation if the symptoms worsen or if the condition fails to improve as anticipated.  Time:  I spent 11 minutes with the patient via telehealth technology discussing the above problems/concerns.    Gildardo Pounds, NP

## 2022-04-21 ENCOUNTER — Encounter: Payer: Self-pay | Admitting: Family Medicine

## 2022-04-21 ENCOUNTER — Ambulatory Visit: Payer: BC Managed Care – PPO | Admitting: Family Medicine

## 2022-04-21 VITALS — BP 132/82 | HR 89 | Temp 98.1°F | Wt 226.0 lb

## 2022-04-21 DIAGNOSIS — E119 Type 2 diabetes mellitus without complications: Secondary | ICD-10-CM | POA: Diagnosis not present

## 2022-04-21 DIAGNOSIS — F419 Anxiety disorder, unspecified: Secondary | ICD-10-CM

## 2022-04-21 DIAGNOSIS — F32A Depression, unspecified: Secondary | ICD-10-CM

## 2022-04-21 DIAGNOSIS — I10 Essential (primary) hypertension: Secondary | ICD-10-CM

## 2022-04-21 DIAGNOSIS — L84 Corns and callosities: Secondary | ICD-10-CM

## 2022-04-21 DIAGNOSIS — J40 Bronchitis, not specified as acute or chronic: Secondary | ICD-10-CM | POA: Insufficient documentation

## 2022-04-21 DIAGNOSIS — Z1211 Encounter for screening for malignant neoplasm of colon: Secondary | ICD-10-CM

## 2022-04-21 DIAGNOSIS — R791 Abnormal coagulation profile: Secondary | ICD-10-CM

## 2022-04-21 DIAGNOSIS — F17210 Nicotine dependence, cigarettes, uncomplicated: Secondary | ICD-10-CM

## 2022-04-21 DIAGNOSIS — B351 Tinea unguium: Secondary | ICD-10-CM

## 2022-04-21 DIAGNOSIS — Z760 Encounter for issue of repeat prescription: Secondary | ICD-10-CM

## 2022-04-21 MED ORDER — SPIRONOLACTONE 25 MG PO TABS: 12.5000 mg | ORAL_TABLET | Freq: Every day | ORAL | 1 refills | Status: DC

## 2022-04-21 MED ORDER — METFORMIN HCL ER 500 MG PO TB24: 500.0000 mg | ORAL_TABLET | Freq: Two times a day (BID) | ORAL | 3 refills | Status: DC

## 2022-04-21 MED ORDER — BUSPIRONE HCL 7.5 MG PO TABS: 7.5000 mg | ORAL_TABLET | Freq: Two times a day (BID) | ORAL | 3 refills | Status: DC

## 2022-04-21 MED ORDER — VENLAFAXINE HCL ER 75 MG PO CP24: ORAL_CAPSULE | ORAL | 1 refills | Status: DC

## 2022-04-21 MED ORDER — AMLODIPINE BESYLATE 10 MG PO TABS: 10.0000 mg | ORAL_TABLET | Freq: Every day | ORAL | 1 refills | Status: DC

## 2022-04-21 NOTE — Assessment & Plan Note (Signed)
Check A1c.  Continue metformin XR 1000 mg daily.  She was encouraged to contact ophthalmology to set up an appointment.

## 2022-04-21 NOTE — Assessment & Plan Note (Signed)
Refer to podiatry

## 2022-04-21 NOTE — Progress Notes (Signed)
Judy Rumps, MD Phone: [phone]  Judy Garcia is a 51 y.o. female who presents today for follow-up.  Hypertension: Typically 120s-130s/80s.  Taking spironolactone and amlodipine.  She notes she does not check all that frequently.  No chest pain, shortness of breath, or edema.  Foot callus: She tried to cut this off herself.  She had a small cut in this area that occurred after she tried to shave it down.  Tobacco abuse: She is ready to quit smoking.  She tried quitting cold [place] in the past.  She has patches and gum at home that she can take.  Diabetes: Sugars are running around 100.  She is taking metformin.  She is due to see ophthalmology.  Bronchitis: Patient had symptoms for the last 2 weeks.  She had a negative COVID test 4 days into her illness.  Started with sinus symptoms and then moved to her chest.  She did a virtual visit 2 days ago and was prescribed a Z-Pak and prednisone and notes she has already started to improve.  She did have some shortness of breath 2 days ago though that has improved.  She has also been using a nasal antihistamine.  Social History   Tobacco Use  Smoking Status Every Day   Packs/day: 0.50   Years: 25.00   Total pack years: 12.50   Types: Cigarettes  Smokeless Tobacco Never    Current Outpatient Medications on File Prior to Visit  Medication Sig Dispense Refill   Accu-Chek Softclix Lancets lancets Use as instructed 100 each 12   albuterol (VENTOLIN HFA) 108 (90 Base) MCG/ACT inhaler INHALE 1 TO 2 PUFFS INTO THE LUNGS EVERY 6 HOURS AS NEEDED FOR WHEEZING OR SHORTNESS OF BREATH 54 g 0   Ascorbic Acid (VITAMIN C) 100 MG tablet Take 100 mg by mouth as needed.     azelastine (ASTELIN) 0.1 % nasal spray Place into the nose.     azithromycin (ZITHROMAX) 250 MG tablet Take 2 tablets on day 1, then 1 tablet daily on days 2 through 5 6 tablet 0   blood glucose meter kit and supplies Dispense based on patient and insurance preference. Use up to  four times daily as directed. (FOR ICD-10 E10.9, E11.9). 1 each 0   docusate sodium (COLACE) 100 MG capsule Take 1 capsule (100 mg total) by mouth daily. 90 capsule 1   fluticasone furoate-vilanterol (BREO ELLIPTA) 100-25 MCG/ACT AEPB Inhale 1 puff into the lungs daily. 1 each 11   glucose blood (ACCU-CHEK GUIDE) test strip Use as instructed 100 each 12   Multiple Vitamin (MULTIVITAMIN) tablet Take 1 tablet by mouth daily.     nicotine (NICODERM CQ - DOSED IN MG/24 HOURS) 21 mg/24hr patch Place 1 patch (21 mg total) onto the skin daily. 28 patch 3   nicotine polacrilex (NICORETTE) 4 MG gum Take 1 each (4 mg total) by mouth as needed for smoking cessation. 100 tablet 3   ondansetron (ZOFRAN) 4 MG tablet Take 1 tablet (4 mg total) by mouth every 8 (eight) hours as needed for nausea or vomiting. 20 tablet 0   predniSONE (DELTASONE) 20 MG tablet Take 1 tablet (20 mg total) by mouth daily with breakfast. 5 tablet 0   PROAIR RESPICLICK 433 (90 Base) MCG/ACT AEPB INHALE 2 PUFFS INTO THE LUNGS FOUR TIMES DAILY AS NEEDED 1 each 0   diclofenac Sodium (VOLTAREN) 1 % GEL Apply 4 g topically 4 (four) times daily. (Patient not taking: Reported on 04/21/2022) 100 g 0  guaiFENesin (MUCINEX) 600 MG 12 hr tablet Take 1 tablet (600 mg total) by mouth 2 (two) times daily. 30 tablet 0   rosuvastatin (CRESTOR) 20 MG tablet Take 1 tablet (20 mg total) by mouth daily. 90 tablet 3   No current facility-administered medications on file prior to visit.     ROS see history of present illness  Objective  Physical Exam Vitals:   04/21/22 1518  BP: 132/82  Pulse: 89  Temp: 98.1 F (36.7 C)  SpO2: 92%    BP Readings from Last 3 Encounters:  04/21/22 132/82  01/15/22 130/80  12/17/21 (!) 150/90   Wt Readings from Last 3 Encounters:  04/21/22 226 lb (102.5 kg)  01/15/22 227 lb (103 kg)  12/17/21 225 lb 9.6 oz (102.3 kg)    Physical Exam Constitutional:      General: She is not in acute distress.     Appearance: She is not diaphoretic.  Cardiovascular:     Rate and Rhythm: Normal rate and regular rhythm.     Heart sounds: Normal heart sounds.  Pulmonary:     Effort: Pulmonary effort is normal.     Breath sounds: Normal breath sounds.  Skin:    General: Skin is warm and dry.     Comments: Callus at the lateral fifth MTP joint on the right foot, no surrounding signs of infection, onychomycosis on most of her toenails of her right foot  Neurological:     Mental Status: She is alert.      Assessment/Plan: Please see individual problem list.  Problem List Items Addressed This Visit     Anxiety and depression (Chronic)   Relevant Medications   busPIRone (BUSPAR) 7.5 MG tablet   venlafaxine XR (EFFEXOR-XR) 75 MG 24 hr capsule   Essential hypertension - Primary (Chronic)    Discussed goal of less than 130/80.  Patient will start checking more consistently when she recovers from her bronchitis.  She will send me her readings in 2 weeks.  She will continue spironolactone 12 and half milligrams daily and amlodipine 10 mg daily.      Relevant Medications   amLODipine (NORVASC) 10 MG tablet   spironolactone (ALDACTONE) 25 MG tablet   Other Relevant Orders   Urine Microalbumin w/creat. ratio   Basic Metabolic Panel (BMET)   Nicotine dependence, cigarettes, uncomplicated (Chronic)    Encouraged smoking cessation.  Patient will try nicotine patches and gum.  I encouraged her to pick a quit date and stick with it.  If the patches and gum are not helpful we could consider Wellbutrin.  She confirms no history of seizures.      Onychomycosis (Chronic)    Refer to podiatry.      Relevant Orders   Ambulatory referral to Podiatry   Type 2 diabetes mellitus without complication, without long-term current use of insulin (HCC) (Chronic)    Check A1c.  Continue metformin XR 1000 mg daily.  She was encouraged to contact ophthalmology to set up an appointment.      Relevant Medications    metFORMIN (GLUCOPHAGE-XR) 500 MG 24 hr tablet   Other Relevant Orders   Urine Microalbumin w/creat. ratio   HgB A1c   Abnormal partial thromboplastin time (PTT)   Relevant Orders   PTT   Bronchitis    Acute issue.  She will finish her course of prednisone and azithromycin.  If she is not continuing to improve over the next 2 days she will let me know and we  can extend her prednisone.  Discussed use of albuterol inhaler.      Foot callus    Refer to podiatry.      Relevant Orders   Ambulatory referral to Podiatry   Other Visit Diagnoses     Depressive disorder       Relevant Medications   busPIRone (BUSPAR) 7.5 MG tablet   venlafaxine XR (EFFEXOR-XR) 75 MG 24 hr capsule   Encounter for medication refill       Relevant Medications   venlafaxine XR (EFFEXOR-XR) 75 MG 24 hr capsule   Colon cancer screening       Relevant Orders   Ambulatory referral to Gastroenterology        Health Maintenance: Patient be referred to GI for colonoscopy.  She will return for a physical to have her Pap smear completed.  Return in about 3 months (around 07/22/2022) for CPE.   Judy Rumps, MD [place]

## 2022-04-21 NOTE — Assessment & Plan Note (Signed)
Acute issue.  She will finish her course of prednisone and azithromycin.  If she is not continuing to improve over the next 2 days she will let me know and we can extend her prednisone.  Discussed use of albuterol inhaler.

## 2022-04-21 NOTE — Assessment & Plan Note (Signed)
Discussed goal of less than 130/80.  Patient will start checking more consistently when she recovers from her bronchitis.  She will send me her readings in 2 weeks.  She will continue spironolactone 12 and half milligrams daily and amlodipine 10 mg daily.

## 2022-04-21 NOTE — Patient Instructions (Signed)
Nice to see you. Podiatry and GI should contact you to set up appointments. If your cough and congestion are not improving over the next few days please let me know and we can extend your prednisone dosing. Please keep track of your blood pressure on a daily basis when you have been at rest for 10 to 15 minutes.  Please send me your readings in 2 weeks.

## 2022-04-21 NOTE — Assessment & Plan Note (Signed)
Encouraged smoking cessation.  Patient will try nicotine patches and gum.  I encouraged her to pick a quit date and stick with it.  If the patches and gum are not helpful we could consider Wellbutrin.  She confirms no history of seizures.

## 2022-04-22 LAB — BASIC METABOLIC PANEL
BUN: 16 mg/dL (ref 6–23)
CO2: 28 mEq/L (ref 19–32)
Calcium: 9.1 mg/dL (ref 8.4–10.5)
Chloride: 104 mEq/L (ref 96–112)
Creatinine, Ser: 0.64 mg/dL (ref 0.40–1.20)
GFR: 100.58 mL/min (ref >60.00)
Glucose, Bld: 117 mg/dL — ABNORMAL HIGH (ref 70–99)
Potassium: 3.8 mEq/L (ref 3.5–5.1)
Sodium: 140 mEq/L (ref 135–145)

## 2022-04-22 LAB — MICROALBUMIN / CREATININE URINE RATIO
Creatinine,U: 97.3 mg/dL
Microalb Creat Ratio: 26.2 mg/g (ref 0.0–30.0)
Microalb, Ur: 25.5 mg/dL — ABNORMAL HIGH (ref 0.0–1.9)

## 2022-04-22 LAB — HEMOGLOBIN A1C: Hgb A1c MFr Bld: 6.5 % (ref 4.6–6.5)

## 2022-04-22 LAB — APTT: aPTT: 33 s — ABNORMAL HIGH (ref 23–32)

## 2022-06-27 ENCOUNTER — Other Ambulatory Visit: Payer: Self-pay | Admitting: Family Medicine

## 2022-06-27 DIAGNOSIS — F32A Depression, unspecified: Secondary | ICD-10-CM

## 2022-06-27 DIAGNOSIS — Z760 Encounter for issue of repeat prescription: Secondary | ICD-10-CM

## 2022-06-27 DIAGNOSIS — E119 Type 2 diabetes mellitus without complications: Secondary | ICD-10-CM

## 2022-06-27 DIAGNOSIS — I10 Essential (primary) hypertension: Secondary | ICD-10-CM

## 2022-07-16 ENCOUNTER — Ambulatory Visit: Payer: BC Managed Care – PPO | Admitting: Family Medicine

## 2022-07-23 ENCOUNTER — Encounter: Payer: BC Managed Care – PPO | Admitting: Family Medicine

## 2022-09-15 ENCOUNTER — Telehealth: Payer: Self-pay | Admitting: Nurse Practitioner

## 2022-09-15 ENCOUNTER — Encounter: Payer: Self-pay | Admitting: Nurse Practitioner

## 2022-09-15 DIAGNOSIS — J0100 Acute maxillary sinusitis, unspecified: Secondary | ICD-10-CM

## 2022-09-15 DIAGNOSIS — J40 Bronchitis, not specified as acute or chronic: Secondary | ICD-10-CM

## 2022-09-15 MED ORDER — DOXYCYCLINE HYCLATE 100 MG PO TABS: 100.0000 mg | ORAL_TABLET | Freq: Two times a day (BID) | ORAL | 0 refills | Status: AC

## 2022-09-15 MED ORDER — FLUTICASONE FUROATE-VILANTEROL 100-25 MCG/ACT IN AEPB: 1.0000 | INHALATION_SPRAY | Freq: Every day | RESPIRATORY_TRACT | 11 refills | Status: DC

## 2022-09-15 NOTE — Progress Notes (Signed)
Virtual Visit Consent   Judy Garcia, you are scheduled for a virtual visit with a [facility] provider today. Just as with appointments in the office, your consent must be obtained to participate. Your consent will be active for this visit and any virtual visit you may have with one of our providers in the next 365 days. If you have a MyChart account, a copy of this consent can be sent to you electronically.  As this is a virtual visit, video technology does not allow for your provider to perform a traditional examination. This may limit your provider's ability to fully assess your condition. If your provider identifies any concerns that need to be evaluated in person or the need to arrange testing (such as labs, EKG, etc.), we will make arrangements to do so. Although advances in technology are sophisticated, we cannot ensure that it will always work on either your end or our end. If the connection with a video visit is poor, the visit may have to be switched to a telephone visit. With either a video or telephone visit, we are not always able to ensure that we have a secure connection.  By engaging in this virtual visit, you consent to the provision of healthcare and authorize for your insurance to be billed (if applicable) for the services provided during this visit. Depending on your insurance coverage, you may receive a charge related to this service.  I need to obtain your verbal consent now. Are you willing to proceed with your visit today? Judy Garcia has provided verbal consent on 09/15/2022 for a virtual visit (video or telephone). [name], FNP  Date: 09/15/2022 3:46 PM  Virtual Visit via Video Note   I, [name], connected with  Judy Garcia  ([id number], [birth year]) on 09/15/22 at  3:45 PM EDT by a video-enabled telemedicine application and verified that I am speaking with the correct person using two identifiers.  Location: Patient: Virtual Visit Location Patient:  Home Provider: Virtual Visit Location Provider: Home Office   I discussed the limitations of evaluation and management by telemedicine and the availability of in person appointments. The patient expressed understanding and agreed to proceed.    History of Present Illness: Judy Garcia is a 52 y.o. who identifies as a female who was assigned female at birth, and is being seen today for ongoing sinus congestion that has progressed to a deep productive cough.  Today she has started to use her Albuterol  Also using a sinus medication over the counter  She has felt more short of breath for the past 48 hours   She started using an over the counter cough medication that allowed her to sleep last night   She had a nose bleed today   She denies a fever   Has continued to use her Breo daily  Also using her nasal sprays that she feels have over dried her nasal passages   Last bronchitis episode was in November   Problems:  Patient Active Problem List   Diagnosis Date Noted   Bronchitis 04/21/2022   Left knee pain 01/15/2022   Foot callus 01/15/2022   Type 2 diabetes mellitus without complication, without long-term current use of insulin 12/17/2021   Hip pain, right 04/15/2021   Acute pain of right knee 04/15/2021   Leg swelling 04/15/2021   Fall 04/15/2021   Atypical mole 04/15/2021   Soft tissue lesion 01/29/2021   Epistaxis 01/29/2021   Ear fullness 01/29/2021   Abnormal partial thromboplastin time (  PTT) 01/29/2021   Chronic bronchitis 01/02/2021   Anxiety and depression 01/02/2021   Sleep disturbance 08/11/2019   Back pain 08/09/2019   History of lipoma 08/09/2019   Lipoma of face 07/28/2018   Onychomycosis 07/28/2018   Essential hypertension 07/28/2018   History of alcohol abuse 07/28/2018   Menopausal symptoms 07/28/2018   History of substance abuse 01/17/2015   Morbid obesity 05/11/2014   Nicotine dependence, cigarettes, uncomplicated 10/25/2013   Generalized  hyperhidrosis 09/14/2012   Leiomyoma of uterus 10/30/2010    Allergies:  Allergies  Allergen Reactions   Lisinopril Swelling    Angioedema. ng   Medications:  Current Outpatient Medications:    Accu-Chek Softclix Lancets lancets, Use as instructed, Disp: 100 each, Rfl: 12   albuterol (VENTOLIN HFA) 108 (90 Base) MCG/ACT inhaler, INHALE 1 TO 2 PUFFS INTO THE LUNGS EVERY 6 HOURS AS NEEDED FOR WHEEZING OR SHORTNESS OF BREATH, Disp: 54 g, Rfl: 0   amLODipine (NORVASC) 10 MG tablet, Take 1 tablet (10 mg total) by mouth daily., Disp: 90 tablet, Rfl: 1   Ascorbic Acid (VITAMIN C) 100 MG tablet, Take 100 mg by mouth as needed., Disp: , Rfl:    azelastine (ASTELIN) 0.1 % nasal spray, Place into the nose., Disp: , Rfl:    blood glucose meter kit and supplies, Dispense based on patient and insurance preference. Use up to four times daily as directed. (FOR ICD-10 E10.9, E11.9)., Disp: 1 each, Rfl: 0   busPIRone (BUSPAR) 7.5 MG tablet, Take 1 tablet (7.5 mg total) by mouth 2 (two) times daily. (Patient not taking: Reported on 04/21/2022), Disp: 60 tablet, Rfl: 3   docusate sodium (COLACE) 100 MG capsule, Take 1 capsule (100 mg total) by mouth daily., Disp: 90 capsule, Rfl: 1   fluticasone furoate-vilanterol (BREO ELLIPTA) 100-25 MCG/ACT AEPB, Inhale 1 puff into the lungs daily., Disp: 1 each, Rfl: 11   glucose blood (ACCU-CHEK GUIDE) test strip, Use as instructed, Disp: 100 each, Rfl: 12   metFORMIN (GLUCOPHAGE-XR) 500 MG 24 hr tablet, TAKE 1 TABLET(500 MG) BY MOUTH TWICE DAILY WITH A MEAL, Disp: 60 tablet, Rfl: 3   Multiple Vitamin (MULTIVITAMIN) tablet, Take 1 tablet by mouth daily., Disp: , Rfl:    nicotine (NICODERM CQ - DOSED IN MG/24 HOURS) 21 mg/24hr patch, Place 1 patch (21 mg total) onto the skin daily., Disp: 28 patch, Rfl: 3   nicotine polacrilex (NICORETTE) 4 MG gum, Take 1 each (4 mg total) by mouth as needed for smoking cessation., Disp: 100 tablet, Rfl: 3   ondansetron (ZOFRAN) 4 MG  tablet, Take 1 tablet (4 mg total) by mouth every 8 (eight) hours as needed for nausea or vomiting., Disp: 20 tablet, Rfl: 0   predniSONE (DELTASONE) 20 MG tablet, Take 1 tablet (20 mg total) by mouth daily with breakfast., Disp: 5 tablet, Rfl: 0   PROAIR RESPICLICK 108 (90 Base) MCG/ACT AEPB, INHALE 2 PUFFS INTO THE LUNGS FOUR TIMES DAILY AS NEEDED, Disp: 1 each, Rfl: 0   rosuvastatin (CRESTOR) 20 MG tablet, Take 1 tablet (20 mg total) by mouth daily., Disp: 90 tablet, Rfl: 3   spironolactone (ALDACTONE) 25 MG tablet, TAKE 1/2 TABLET(12.5 MG) BY MOUTH DAILY, Disp: 45 tablet, Rfl: 1   venlafaxine XR (EFFEXOR-XR) 75 MG 24 hr capsule, TAKE 3 CAPSULES BY MOUTH DAILY WITH BREAKFAST, Disp: 270 capsule, Rfl: 1  Observations/Objective: Patient is well-developed, well-nourished in no acute distress.  Resting comfortably  at home.  Head is normocephalic, atraumatic.  No labored  breathing.  Speech is clear and coherent with logical content.  Patient is alert and oriented at baseline.    Assessment and Plan: 1. Acute non-recurrent maxillary sinusitis Take antibiotic with food   - doxycycline (VIBRA-TABS) 100 MG tablet; Take 1 tablet (100 mg total) by mouth 2 (two) times daily for 10 days.  Dispense: 20 tablet; Refill: 0  2. Bronchitis Continue Albuterol as needed and Breo daily as directed  Provided one time refill at patient's request   - fluticasone furoate-vilanterol (BREO ELLIPTA) 100-25 MCG/ACT AEPB; Inhale 1 puff into the lungs daily.  Dispense: 1 each; Refill: 11    Continue Azelastine nasal spray  May use Coricidin HBP to continue to support cough   Follow Up Instructions: I discussed the assessment and treatment plan with the patient. The patient was provided an opportunity to ask questions and all were answered. The patient agreed with the plan and demonstrated an understanding of the instructions.  A copy of instructions were sent to the patient via MyChart unless otherwise noted  below.    The patient was advised to call back or seek an in-person evaluation if the symptoms worsen or if the condition fails to improve as anticipated.  Time:  I spent 12 minutes with the patient via telehealth technology discussing the above problems/concerns.    [name], FNP

## 2022-09-19 ENCOUNTER — Other Ambulatory Visit: Payer: Self-pay | Admitting: Family Medicine

## 2022-09-19 DIAGNOSIS — E119 Type 2 diabetes mellitus without complications: Secondary | ICD-10-CM

## 2022-12-02 IMAGING — CR DG CHEST 2V
1 series · 2 of 2 positions shown · non-contrast
Comparison: 04/13/2018

CLINICAL DATA: Cough.  Chronic cough and shortness of breath.

EXAM:
CHEST - 2 VIEW

[Series 1: dg chest 2 view · 0.14mm/px · 2 of 2 slices shown]
[im 1/2]
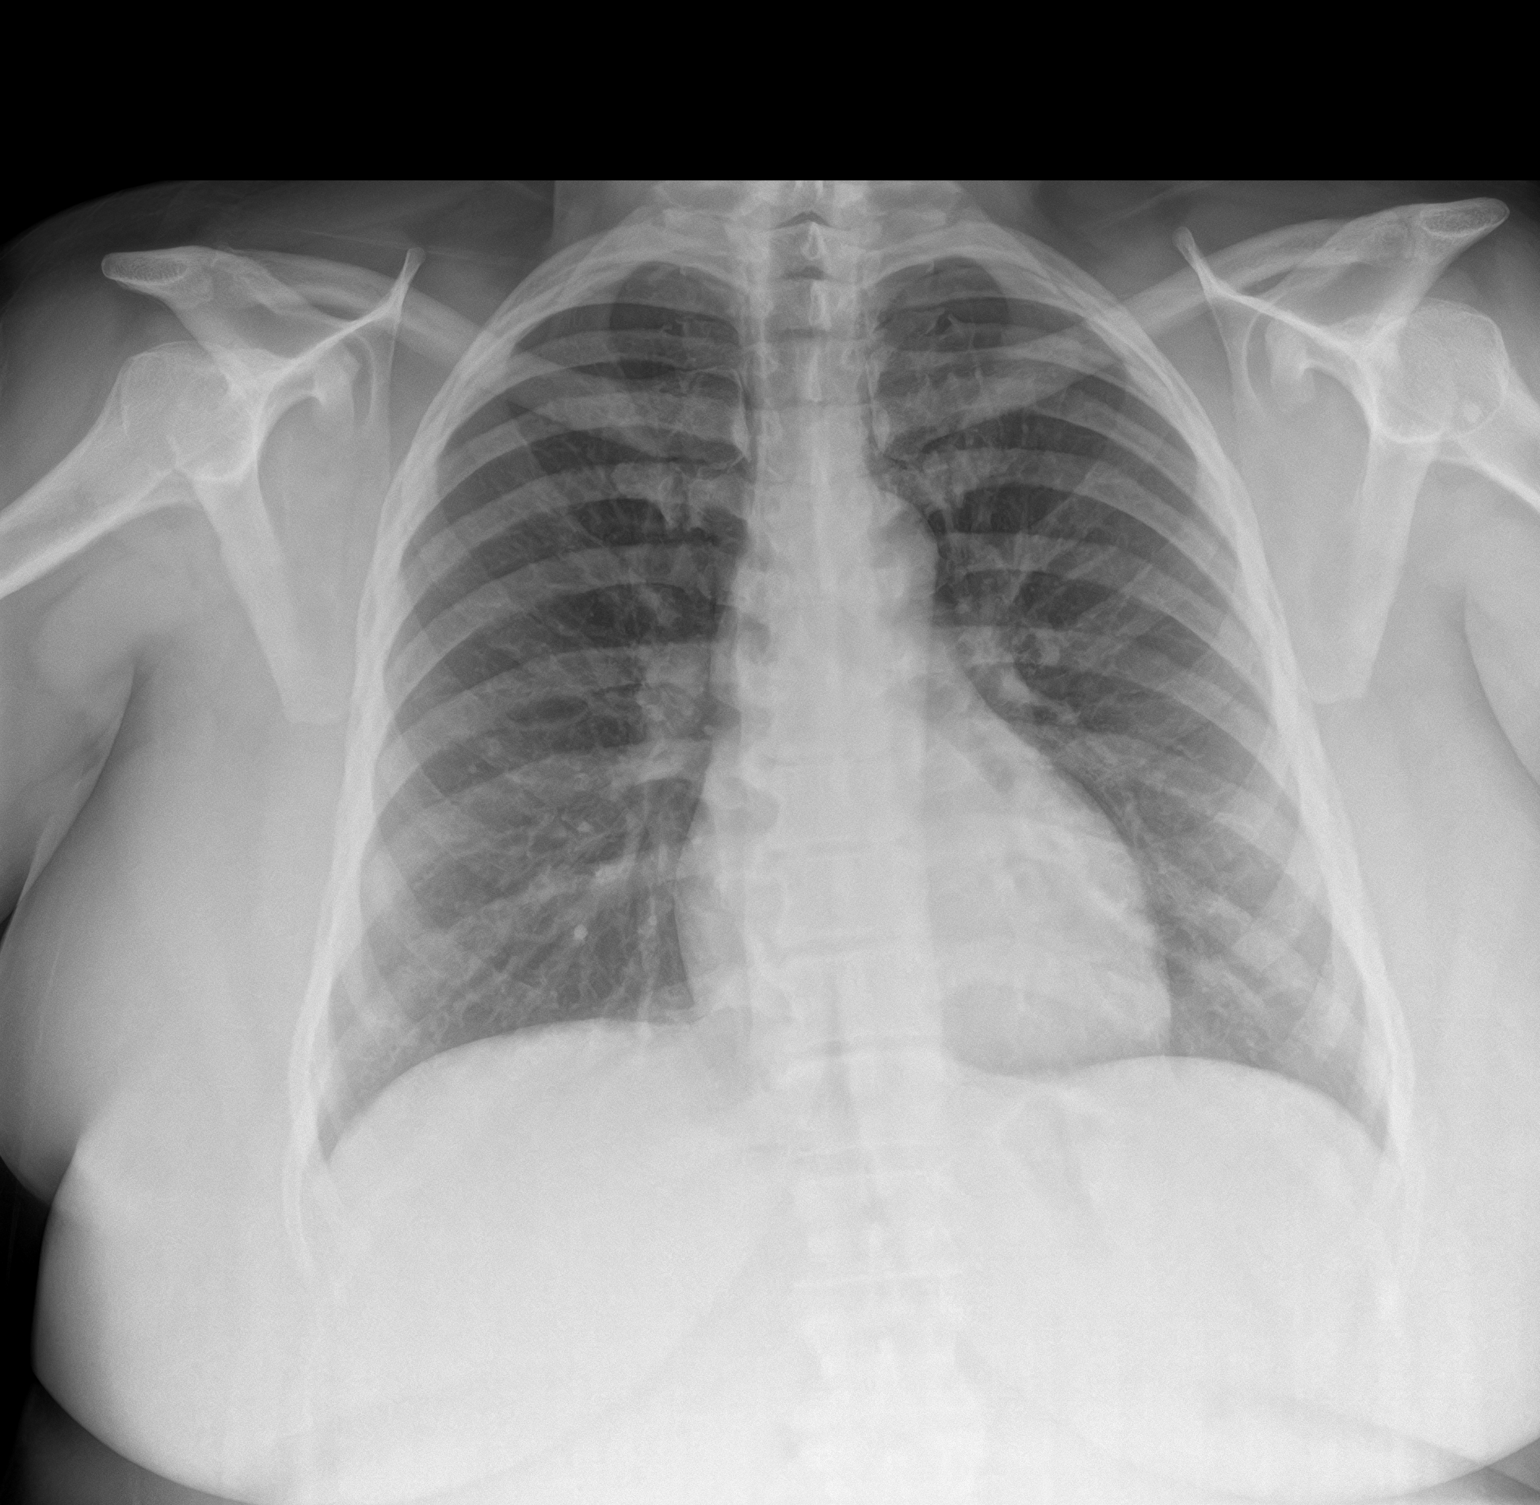
[im 2/2]
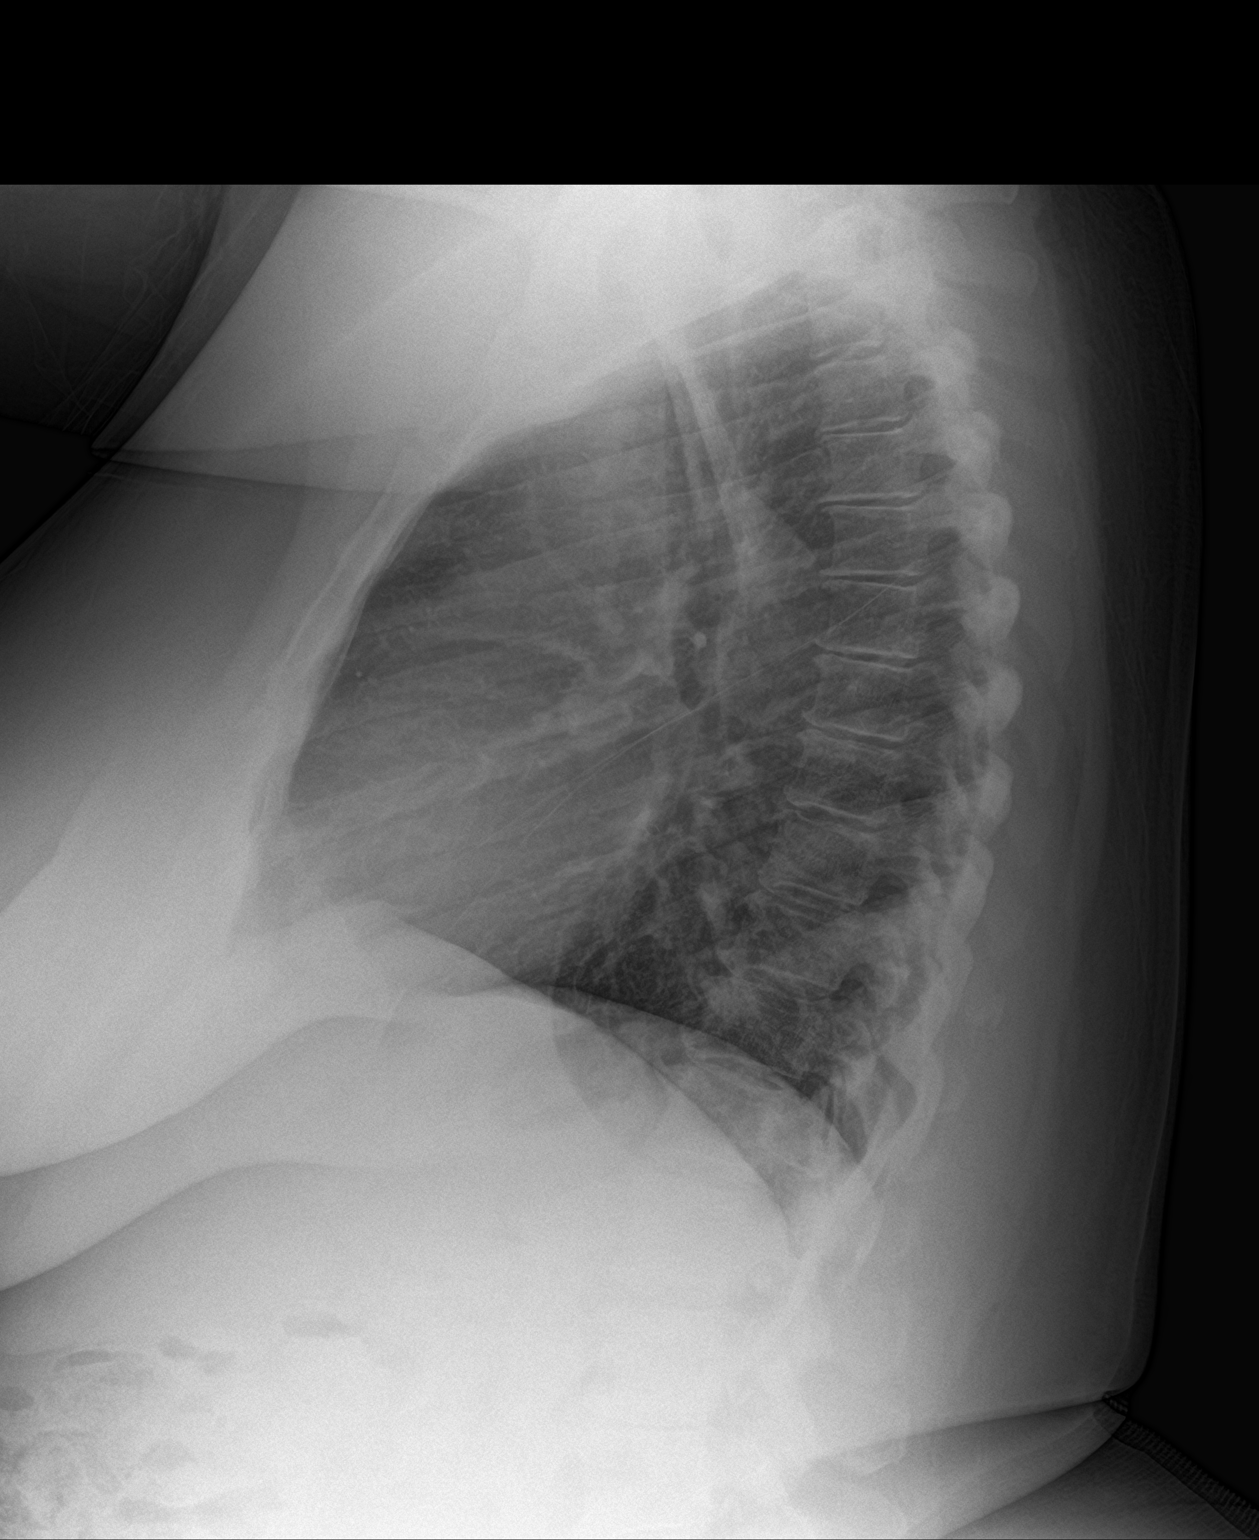

[2 of 2 positions shown; findings below may reference images not displayed]

FINDINGS: The heart size and mediastinal contours are within normal limits.
Both lungs are clear. The visualized skeletal structures are
unremarkable.
IMPRESSION: No active cardiopulmonary disease.

## 2023-01-12 ENCOUNTER — Encounter: Payer: Self-pay | Admitting: Family Medicine

## 2023-01-20 IMAGING — CR DG TIBIA/FIBULA 2V*R*
1 series · 4 of 4 positions shown · non-contrast
Comparison: None.

CLINICAL DATA: Fall 2 weeks ago, pain in the right leg

EXAM:
PELVIS - 1-2 VIEW; RIGHT FEMUR PORTABLE 2 VIEW; RIGHT TIBIA AND
FIBULA - 2 VIEW

[Series 1: dg tibia/fibula right · 0.14mm/px · 4 of 4 slices shown]
[im 1/4]
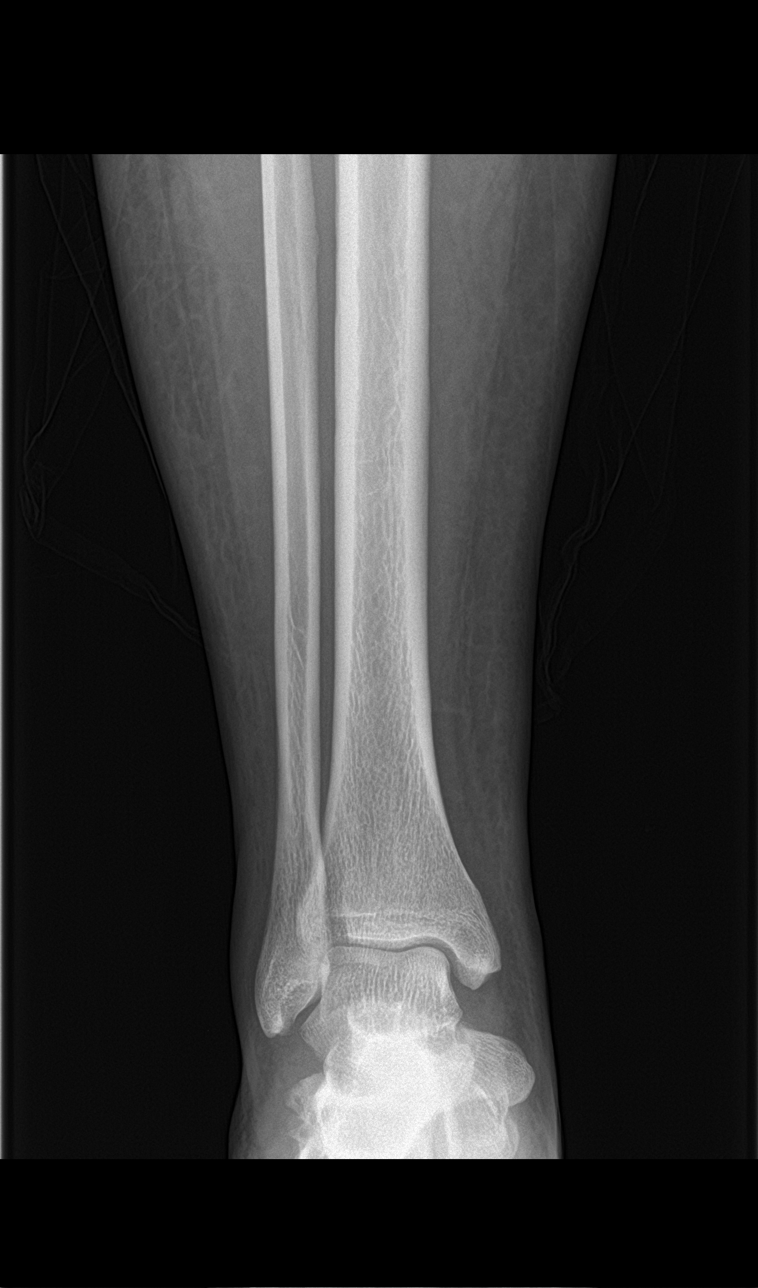
[im 2/4]
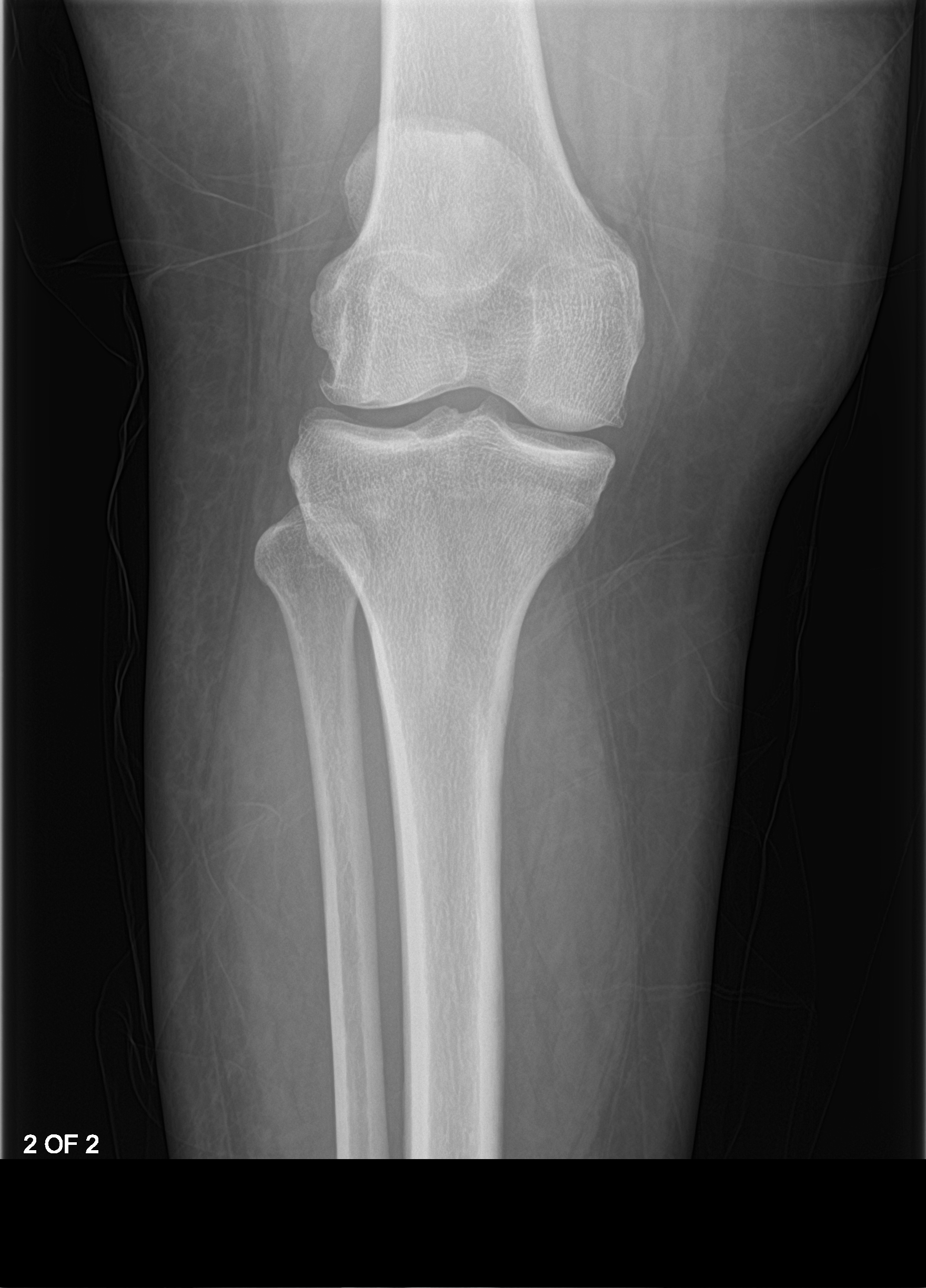
[im 3/4]
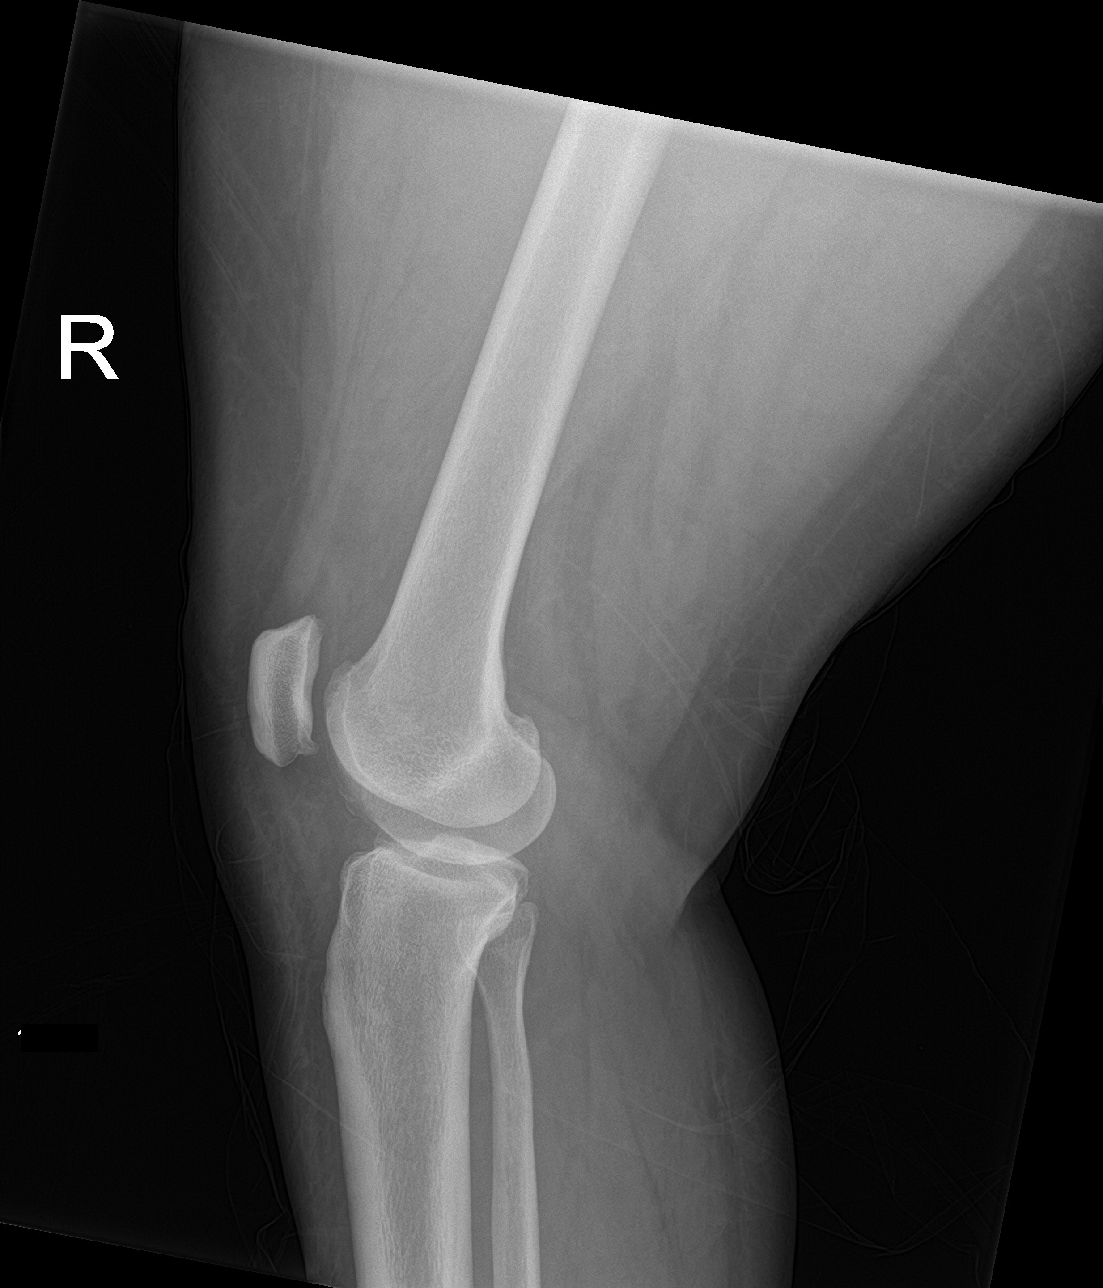
[im 4/4]
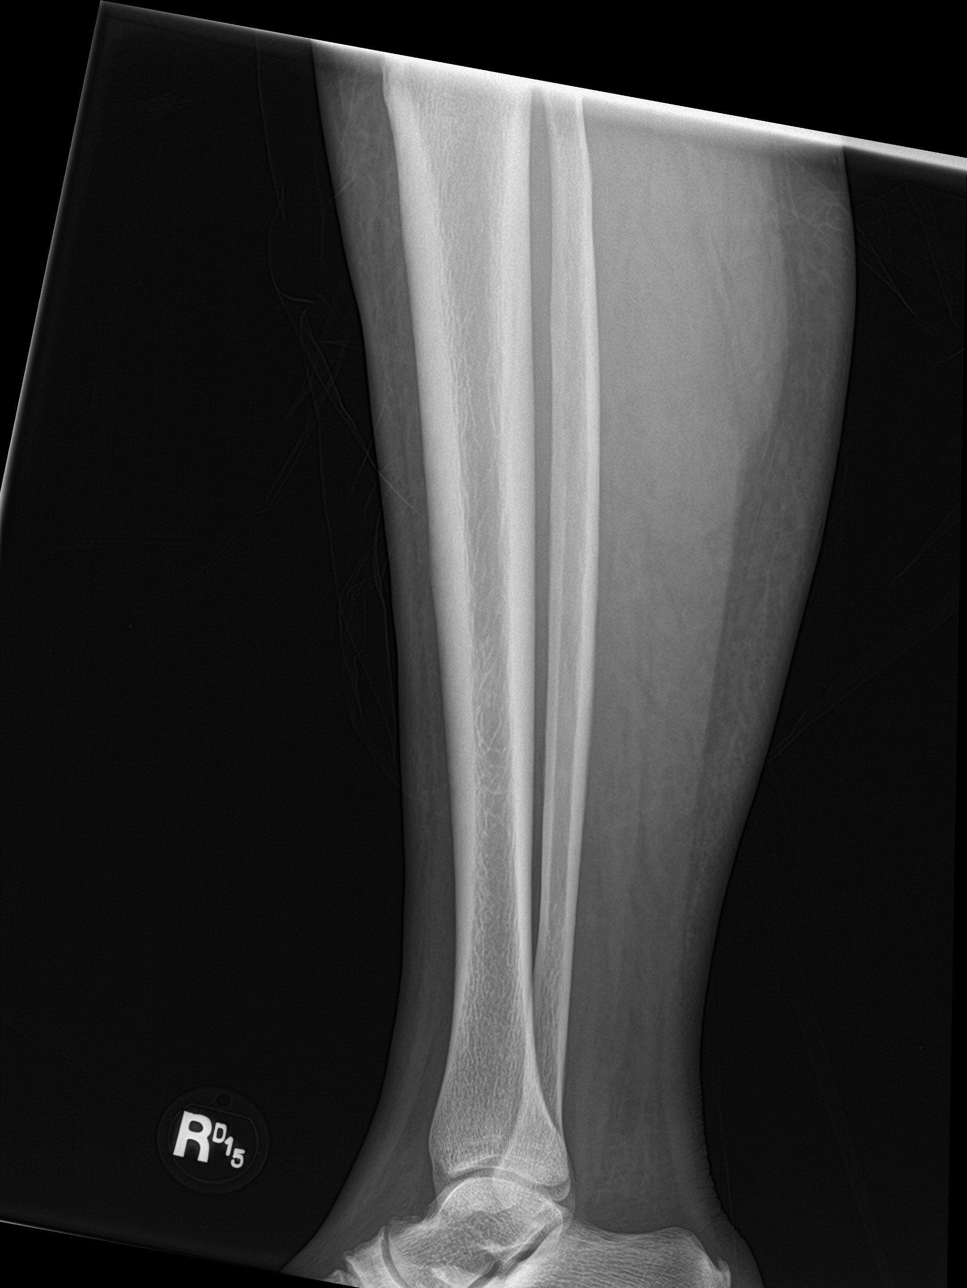

[4 of 4 positions shown; findings below may reference images not displayed]

FINDINGS: There is no evidence of displaced pelvic fracture or diastasis. No
pelvic bone lesions are seen. Nonobstructive pattern of overlying
bowel gas. Ligation clip in the central pelvis.

No displaced fracture or dislocation of the right hip or femur.

No fracture or dislocation of the right tibia or fibula. Soft
tissues are unremarkable.
IMPRESSION: 1. No displaced fracture or dislocation of the pelvis.
2. No displaced fracture or dislocation of the right hip or femur.
3. No fracture or dislocation of the right tibia or fibula.

## 2023-01-20 IMAGING — US US EXTREM LOW VENOUS*R*
1 series · 12 of 24 positions shown · non-contrast
Comparison: None.
COMPARISON: None.

Addendum:
CLINICAL DATA: 50-year-old female with leg swelling after fall



[Series 1: us venous img lower uni right (dvt) · portal-venous · 12 of 31 slices shown]
[im 2/31]
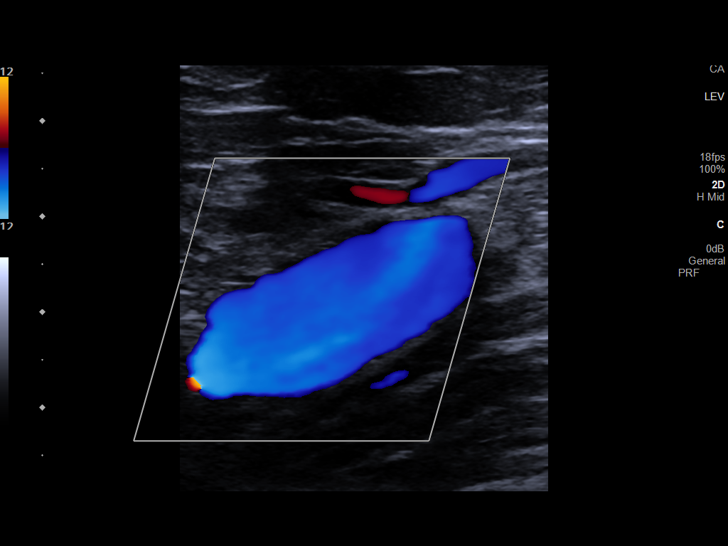
[im 4/31]
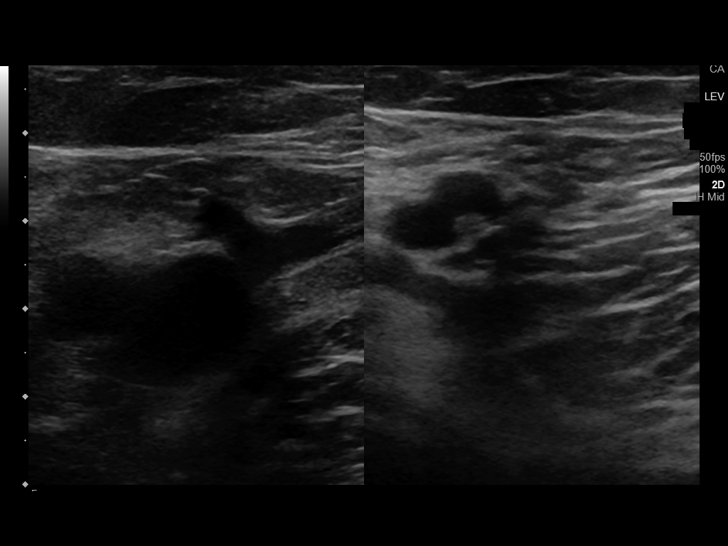
[im 7/31]
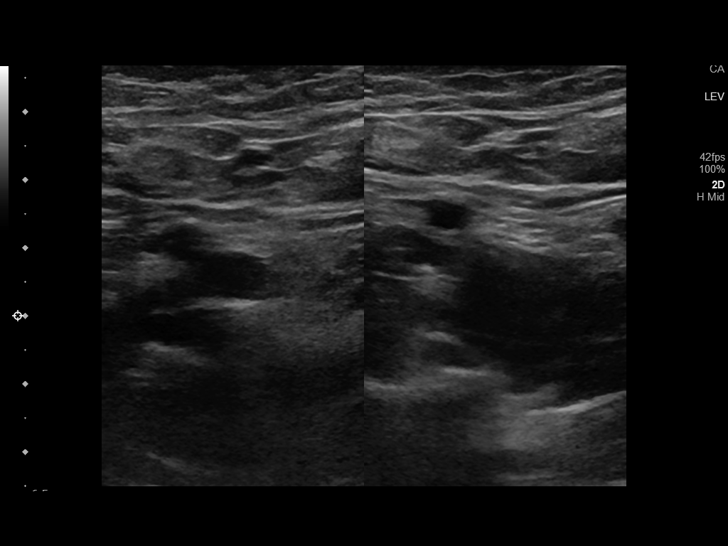
[im 10/31]
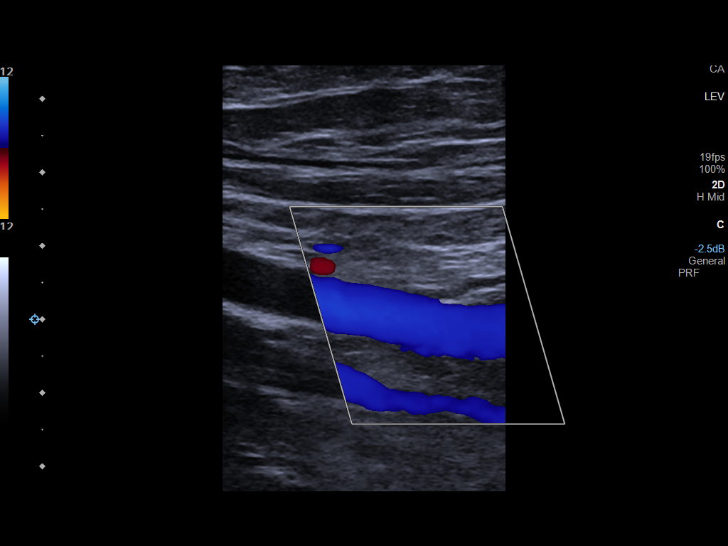
[im 12/31]
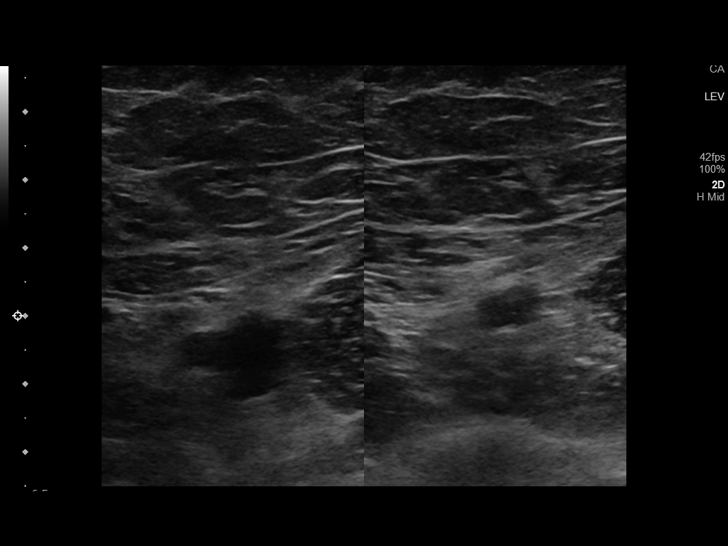
[im 15/31]
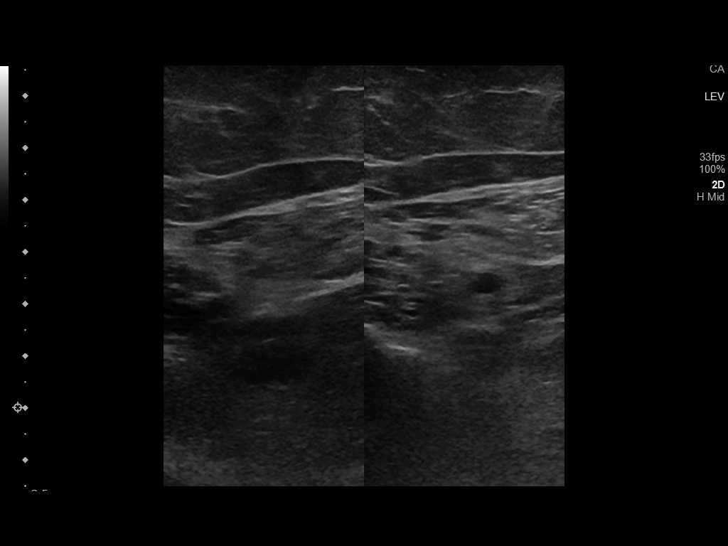
[im 17/31]
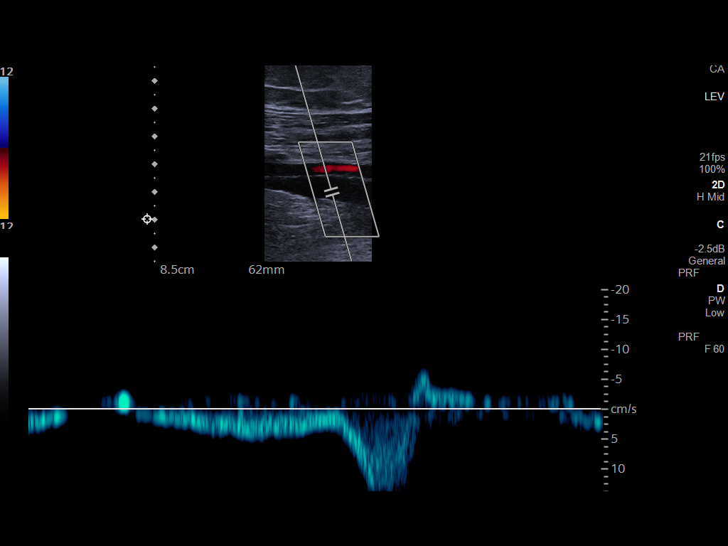
[im 20/31]
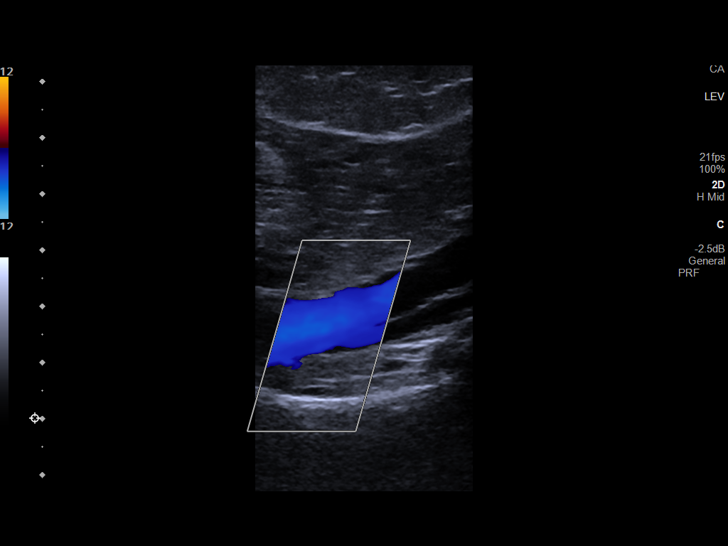
[im 23/31]
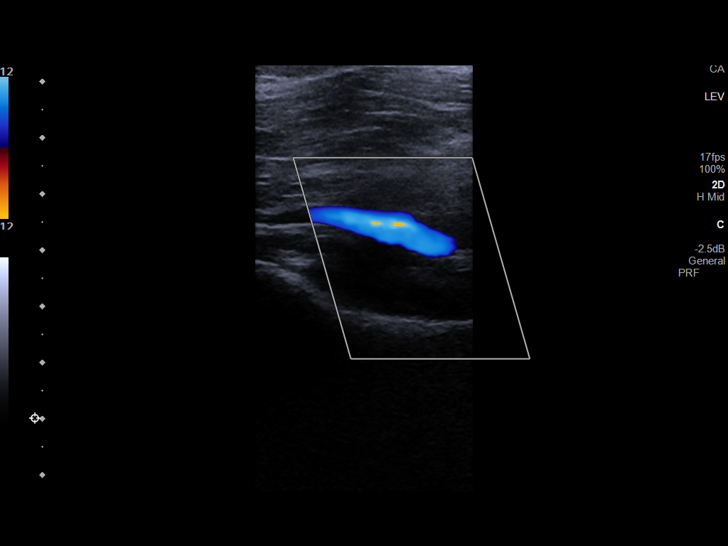
[im 25/31]
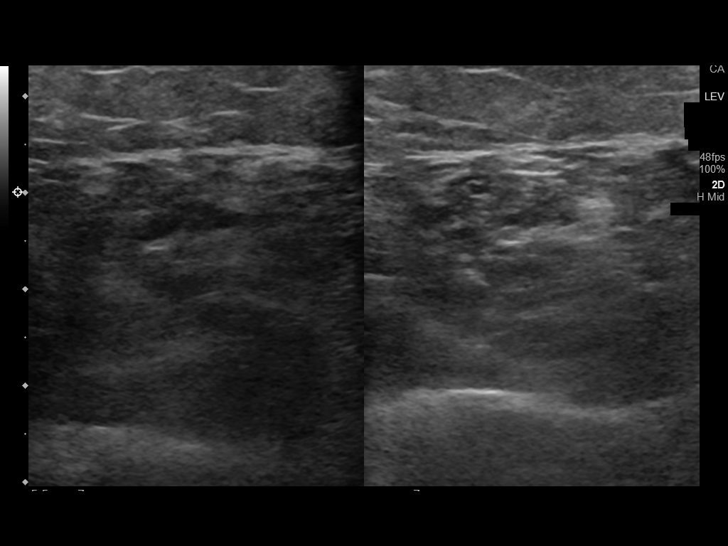
[im 28/31]
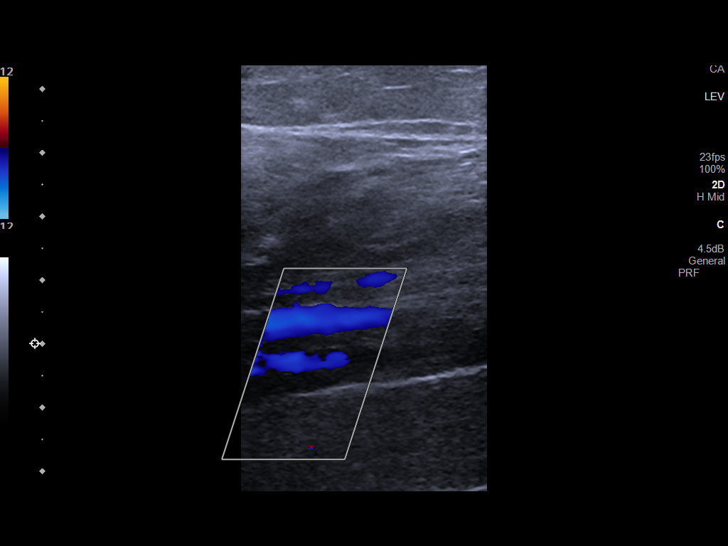
[im 31/31]
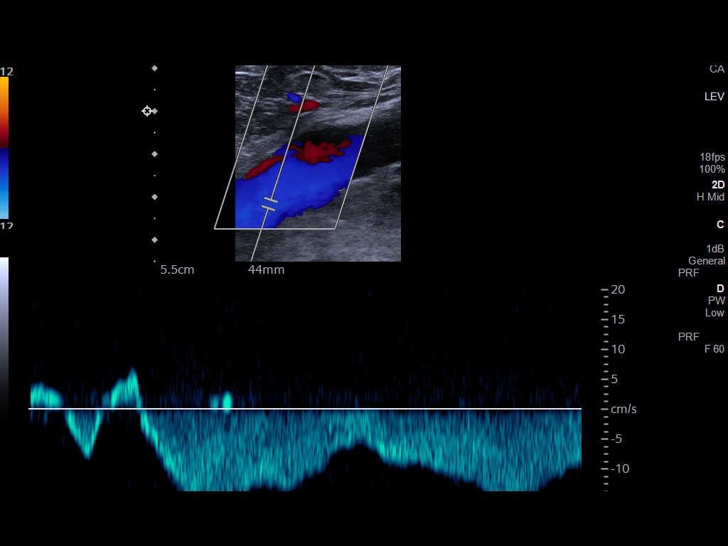

[12 of 24 positions shown; findings below may reference images not displayed]

FINDINGS: Contralateral Common Femoral Vein: Respiratory phasicity is normal
and symmetric with the symptomatic side. No evidence of thrombus.
Normal compressibility.

Common Femoral Vein: No evidence of thrombus. Normal
compressibility, respiratory phasicity and response to augmentation.

Saphenofemoral Junction: No evidence of thrombus. Normal
compressibility and flow on color Doppler imaging.

Profunda Femoral Vein: No evidence of thrombus. Normal
compressibility and flow on color Doppler imaging.

Femoral Vein: No evidence of thrombus. Normal compressibility,
respiratory phasicity and response to augmentation.

Popliteal Vein: No evidence of thrombus. Normal compressibility,
respiratory phasicity and response to augmentation.

Calf Veins: No evidence of thrombus. Normal compressibility and flow
on color Doppler imaging.

Superficial Great Saphenous Vein: No evidence of thrombus. Normal
compressibility and flow on color Doppler imaging.

Other Findings:  None.
IMPRESSION: Directed duplex of the left lower extremity negative for DVT

ADDENDUM:
Addendum created to address labeled side
IMPRESSION: Directed duplex right lower extremity negative for DVT

*** End of Addendum ***
FINDINGS: Contralateral Common Femoral Vein: Respiratory phasicity is normal
and symmetric with the symptomatic side. No evidence of thrombus.
Normal compressibility.

Common Femoral Vein: No evidence of thrombus. Normal
compressibility, respiratory phasicity and response to augmentation.

Saphenofemoral Junction: No evidence of thrombus. Normal
compressibility and flow on color Doppler imaging.

Profunda Femoral Vein: No evidence of thrombus. Normal
compressibility and flow on color Doppler imaging.

Femoral Vein: No evidence of thrombus. Normal compressibility,
respiratory phasicity and response to augmentation.

Popliteal Vein: No evidence of thrombus. Normal compressibility,
respiratory phasicity and response to augmentation.

Calf Veins: No evidence of thrombus. Normal compressibility and flow
on color Doppler imaging.

Superficial Great Saphenous Vein: No evidence of thrombus. Normal
compressibility and flow on color Doppler imaging.

Other Findings:  None.
IMPRESSION: Directed duplex of the left lower extremity negative for DVT

## 2023-02-17 ENCOUNTER — Encounter: Payer: Self-pay | Admitting: *Deleted

## 2023-02-17 ENCOUNTER — Other Ambulatory Visit: Payer: Self-pay | Admitting: Family Medicine

## 2023-02-17 DIAGNOSIS — I10 Essential (primary) hypertension: Secondary | ICD-10-CM

## 2023-03-23 ENCOUNTER — Other Ambulatory Visit: Payer: Self-pay | Admitting: Family Medicine

## 2023-03-23 DIAGNOSIS — I10 Essential (primary) hypertension: Secondary | ICD-10-CM

## 2023-03-24 ENCOUNTER — Other Ambulatory Visit: Payer: Self-pay | Admitting: Family Medicine

## 2023-03-24 DIAGNOSIS — I10 Essential (primary) hypertension: Secondary | ICD-10-CM

## 2023-03-24 NOTE — Telephone Encounter (Signed)
Lvm for pt to call office back to schedule an appt.

## 2023-03-24 NOTE — Telephone Encounter (Signed)
30-day prescription was sent in.  It has been close to a year since the patient's been seen.  She needs to schedule follow-up to be able to get a 90-day supply.  Please call her and get her scheduled.  Thanks.

## 2023-04-03 ENCOUNTER — Encounter: Payer: Self-pay | Admitting: *Deleted

## 2023-04-06 ENCOUNTER — Other Ambulatory Visit: Payer: Self-pay | Admitting: Family Medicine

## 2023-04-06 DIAGNOSIS — I10 Essential (primary) hypertension: Secondary | ICD-10-CM

## 2023-04-16 NOTE — Telephone Encounter (Signed)
Appt has been scheduled by Morrie Sheldon

## 2023-04-18 ENCOUNTER — Other Ambulatory Visit: Payer: Self-pay | Admitting: Family Medicine

## 2023-04-18 DIAGNOSIS — F32A Depression, unspecified: Secondary | ICD-10-CM

## 2023-04-18 DIAGNOSIS — F419 Anxiety disorder, unspecified: Secondary | ICD-10-CM

## 2023-04-18 DIAGNOSIS — Z760 Encounter for issue of repeat prescription: Secondary | ICD-10-CM

## 2023-04-27 ENCOUNTER — Encounter: Payer: Self-pay | Admitting: Family Medicine

## 2023-04-27 ENCOUNTER — Ambulatory Visit: Payer: Self-pay | Admitting: Family Medicine

## 2023-04-27 ENCOUNTER — Ambulatory Visit (INDEPENDENT_AMBULATORY_CARE_PROVIDER_SITE_OTHER): Payer: Self-pay | Admitting: Family Medicine

## 2023-04-27 VITALS — BP 128/76 | HR 92 | Temp 98.4°F | Ht 61.0 in | Wt 230.6 lb

## 2023-04-27 DIAGNOSIS — H40053 Ocular hypertension, bilateral: Secondary | ICD-10-CM

## 2023-04-27 DIAGNOSIS — E119 Type 2 diabetes mellitus without complications: Secondary | ICD-10-CM

## 2023-04-27 DIAGNOSIS — M799 Soft tissue disorder, unspecified: Secondary | ICD-10-CM

## 2023-04-27 DIAGNOSIS — F17210 Nicotine dependence, cigarettes, uncomplicated: Secondary | ICD-10-CM

## 2023-04-27 DIAGNOSIS — Z7984 Long term (current) use of oral hypoglycemic drugs: Secondary | ICD-10-CM

## 2023-04-27 DIAGNOSIS — I10 Essential (primary) hypertension: Secondary | ICD-10-CM

## 2023-04-27 MED ORDER — BUPROPION HCL ER (SR) 150 MG PO TB12: ORAL_TABLET | ORAL | 1 refills | Status: DC

## 2023-04-27 NOTE — Progress Notes (Signed)
Marikay Alar, MD Phone: [phone]  Judy Garcia is a 52 y.o. female who presents today for f/u.  HYPERTENSION Disease Monitoring Home BP Monitoring not checking Chest pain- no    Dyspnea- no Medications Compliance-  taking amlodipine, spironolactone.   Edema- no BMET    Component Value Date/Time   NA 140 04/21/2022 1522   K 3.8 04/21/2022 1522   CL 104 04/21/2022 1522   CO2 28 04/21/2022 1522   GLUCOSE 117 (H) 04/21/2022 1522   BUN 16 04/21/2022 1522   CREATININE 0.64 04/21/2022 1522   CALCIUM 9.1 04/21/2022 1522   GFRNONAA >60 06/21/2020 1155   GFRAA >60 04/13/2018 1651   DIABETES Disease Monitoring: Blood Sugar ranges-90s-100s Polyuria/phagia/dipsia- no      Optho- [facility], saw in the past 6 months, the recommended she see ophthalmology for elevated eye pressure Medications: Compliance- taking metformin, notes there are days she skips it as it reduces her appetite Hypoglycemic symptoms- only if she skips eating Does report a history of pancreatitis.   Tobacco abuse: Patient is ready to quit smoking.  She notes in the past she used Wellbutrin with good benefit.  No seizure history.  Lipoma: Patient is requesting a referral for a second opinion.  She wants to see if somebody will do this in the office.  Patient was advised by her optometrist that she had elevated eye pressure and they were going to refer her to a specialist.   Social History   Tobacco Use  Smoking Status Every Day   Current packs/day: 0.50   Average packs/day: 0.5 packs/day for 25.0 years (12.5 ttl pk-yrs)   Types: Cigarettes  Smokeless Tobacco Never    Current Outpatient Medications on File Prior to Visit  Medication Sig Dispense Refill   Accu-Chek Softclix Lancets lancets Use as instructed 100 each 12   albuterol (VENTOLIN HFA) 108 (90 Base) MCG/ACT inhaler INHALE 1 TO 2 PUFFS INTO THE LUNGS EVERY 6 HOURS AS NEEDED FOR WHEEZING OR SHORTNESS OF BREATH 54 g 0   amLODipine (NORVASC) 10 MG  tablet TAKE 1 TABLET(10 MG) BY MOUTH DAILY 30 tablet 0   Ascorbic Acid (VITAMIN C) 100 MG tablet Take 100 mg by mouth as needed.     azelastine (ASTELIN) 0.1 % nasal spray Place into the nose.     blood glucose meter kit and supplies Dispense based on patient and insurance preference. Use up to four times daily as directed. (FOR ICD-10 E10.9, E11.9). 1 each 0   busPIRone (BUSPAR) 7.5 MG tablet Take 1 tablet (7.5 mg total) by mouth 2 (two) times daily. 60 tablet 3   docusate sodium (COLACE) 100 MG capsule Take 1 capsule (100 mg total) by mouth daily. 90 capsule 1   fluticasone furoate-vilanterol (BREO ELLIPTA) 100-25 MCG/ACT AEPB Inhale 1 puff into the lungs daily. 1 each 11   glucose blood (ACCU-CHEK GUIDE) test strip Use as instructed 100 each 12   metFORMIN (GLUCOPHAGE-XR) 500 MG 24 hr tablet TAKE 1 TABLET(500 MG) BY MOUTH TWICE DAILY WITH A MEAL 60 tablet 3   Multiple Vitamin (MULTIVITAMIN) tablet Take 1 tablet by mouth daily.     nicotine (NICODERM CQ - DOSED IN MG/24 HOURS) 21 mg/24hr patch Place 1 patch (21 mg total) onto the skin daily. 28 patch 3   nicotine polacrilex (NICORETTE) 4 MG gum Take 1 each (4 mg total) by mouth as needed for smoking cessation. 100 tablet 3   ondansetron (ZOFRAN) 4 MG tablet Take 1 tablet (4 mg total)  by mouth every 8 (eight) hours as needed for nausea or vomiting. 20 tablet 0   PROAIR RESPICLICK 108 (90 Base) MCG/ACT AEPB INHALE 2 PUFFS INTO THE LUNGS FOUR TIMES DAILY AS NEEDED 1 each 0   spironolactone (ALDACTONE) 25 MG tablet TAKE 1/2 TABLET(12.5 MG) BY MOUTH DAILY 45 tablet 0   venlafaxine XR (EFFEXOR-XR) 75 MG 24 hr capsule TAKE 3 CAPSULES BY MOUTH DAILY WITH BREAKFAST 270 capsule 0   No current facility-administered medications on file prior to visit.     ROS see history of present illness  Objective  Physical Exam Vitals:   04/27/23 1346  BP: 128/76  Pulse: 92  Temp: 98.4 F (36.9 C)  SpO2: 99%    BP Readings from Last 3 Encounters:   04/27/23 128/76  04/21/22 132/82  01/15/22 130/80   Wt Readings from Last 3 Encounters:  04/27/23 230 lb 9.6 oz (104.6 kg)  04/21/22 226 lb (102.5 kg)  01/15/22 227 lb (103 kg)    Physical Exam Constitutional:      General: She is not in acute distress.    Appearance: She is not diaphoretic.  Cardiovascular:     Rate and Rhythm: Normal rate and regular rhythm.     Heart sounds: Normal heart sounds.  Pulmonary:     Effort: Pulmonary effort is normal.     Breath sounds: Normal breath sounds.  Skin:    General: Skin is warm and dry.  Neurological:     Mental Status: She is alert.      Assessment/Plan: Please see individual problem list.  Essential hypertension Assessment & Plan: Chronic issue.  Adequately controlled.  Patient will continue amlodipine 10 mg daily and spironolactone 12.5 mg daily.  Orders: -     Comprehensive metabolic panel -     Lipid panel  Type 2 diabetes mellitus without complication, without long-term current use of insulin (HCC) Assessment & Plan: Chronic issue.  Will check lab work.  For now she will continue metformin 500 mg twice daily.  Discussed potentially switching to something else given that this affects her appetite.  She asked about weight loss injections though she notes a history of pancreatitis.  Orders: -     Microalbumin / creatinine urine ratio -     Comprehensive metabolic panel -     Lipid panel -     Hemoglobin A1c  Increased pressure in the eye, bilateral Assessment & Plan: Refer to ophthalmology.  Orders: -     Ambulatory referral to Ophthalmology  Soft tissue lesion Assessment & Plan: Patient notes she would like a second opinion.  She wants to wait until the first of the year for this referral to be placed.  She will send me a MyChart message asking for this referral.   Nicotine dependence, cigarettes, uncomplicated Assessment & Plan: Chronic issue.  Patient is ready to quit smoking.  Will start Wellbutrin 150  mg daily for 3 days and then 150 mg twice daily.  She will start this 1 week before her quit date.  Orders: -     buPROPion HCl ER (SR); Take 1 tablet (150 mg total) by mouth daily for 3 days, THEN 1 tablet (150 mg total) 2 (two) times daily.  Dispense: 183 tablet; Refill: 1    Return in about 3 months (around 07/28/2023) for transfer of care.   Marikay Alar, MD [facility]

## 2023-04-27 NOTE — Assessment & Plan Note (Signed)
Patient notes she would like a second opinion.  She wants to wait until the first of the year for this referral to be placed.  She will send me a MyChart message asking for this referral.

## 2023-04-27 NOTE — Assessment & Plan Note (Signed)
Chronic issue.  Adequately controlled.  Patient will continue amlodipine 10 mg daily and spironolactone 12.5 mg daily.

## 2023-04-27 NOTE — Assessment & Plan Note (Signed)
Chronic issue.  Patient is ready to quit smoking.  Will start Wellbutrin 150 mg daily for 3 days and then 150 mg twice daily.  She will start this 1 week before her quit date.

## 2023-04-27 NOTE — Assessment & Plan Note (Signed)
Chronic issue.  Will check lab work.  For now she will continue metformin 500 mg twice daily.  Discussed potentially switching to something else given that this affects her appetite.  She asked about weight loss injections though she notes a history of pancreatitis.

## 2023-04-27 NOTE — Assessment & Plan Note (Signed)
-  Refer to ophthalmology

## 2023-04-28 LAB — LIPID PANEL
Cholesterol: 226 mg/dL — ABNORMAL HIGH (ref 0–200)
HDL: 53.3 mg/dL (ref >39.00)
LDL Cholesterol: 149 mg/dL — ABNORMAL HIGH (ref 0–99)
NonHDL: 173.18
Total CHOL/HDL Ratio: 4
Triglycerides: 122 mg/dL (ref 0.0–149.0)
VLDL: 24.4 mg/dL (ref 0.0–40.0)

## 2023-04-28 LAB — COMPREHENSIVE METABOLIC PANEL
ALT: 19 U/L (ref 0–35)
AST: 19 U/L (ref 0–37)
Albumin: 4.3 g/dL (ref 3.5–5.2)
Alkaline Phosphatase: 94 U/L (ref 39–117)
BUN: 12 mg/dL (ref 6–23)
CO2: 28 meq/L (ref 19–32)
Calcium: 9.4 mg/dL (ref 8.4–10.5)
Chloride: 102 meq/L (ref 96–112)
Creatinine, Ser: 0.6 mg/dL (ref 0.40–1.20)
GFR: 101.43 mL/min (ref >60.00)
Glucose, Bld: 115 mg/dL — ABNORMAL HIGH (ref 70–99)
Potassium: 4 meq/L (ref 3.5–5.1)
Sodium: 138 meq/L (ref 135–145)
Total Bilirubin: 0.4 mg/dL (ref 0.2–1.2)
Total Protein: 7.2 g/dL (ref 6.0–8.3)

## 2023-04-28 LAB — MICROALBUMIN / CREATININE URINE RATIO
Creatinine,U: 94.7 mg/dL
Microalb Creat Ratio: 35.2 mg/g — ABNORMAL HIGH (ref 0.0–30.0)
Microalb, Ur: 33.3 mg/dL — ABNORMAL HIGH (ref 0.0–1.9)

## 2023-04-28 LAB — HEMOGLOBIN A1C: Hgb A1c MFr Bld: 6.3 % (ref 4.6–6.5)

## 2023-05-04 ENCOUNTER — Other Ambulatory Visit: Payer: Self-pay | Admitting: Family Medicine

## 2023-05-04 DIAGNOSIS — E785 Hyperlipidemia, unspecified: Secondary | ICD-10-CM | POA: Insufficient documentation

## 2023-05-04 DIAGNOSIS — E119 Type 2 diabetes mellitus without complications: Secondary | ICD-10-CM

## 2023-05-04 DIAGNOSIS — E7800 Pure hypercholesterolemia, unspecified: Secondary | ICD-10-CM

## 2023-05-04 MED ORDER — METFORMIN HCL ER 500 MG PO TB24: ORAL_TABLET | ORAL | 3 refills | Status: DC

## 2023-05-04 MED ORDER — ROSUVASTATIN CALCIUM 40 MG PO TABS: 40.0000 mg | ORAL_TABLET | Freq: Every day | ORAL | 3 refills | Status: DC

## 2023-05-04 MED ORDER — EMPAGLIFLOZIN 10 MG PO TABS: 10.0000 mg | ORAL_TABLET | Freq: Every day | ORAL | 2 refills | Status: DC

## 2023-05-11 NOTE — Addendum Note (Signed)
Addended by: Glori Luis on: 05/11/2023 09:52 AM   Modules accepted: Orders

## 2023-05-29 ENCOUNTER — Other Ambulatory Visit: Payer: Self-pay | Admitting: Family Medicine

## 2023-05-29 DIAGNOSIS — I10 Essential (primary) hypertension: Secondary | ICD-10-CM

## 2023-06-15 ENCOUNTER — Other Ambulatory Visit: Payer: Self-pay

## 2023-07-08 ENCOUNTER — Other Ambulatory Visit: Payer: Self-pay | Admitting: Family Medicine

## 2023-07-08 DIAGNOSIS — I10 Essential (primary) hypertension: Secondary | ICD-10-CM

## 2023-07-10 ENCOUNTER — Telehealth: Payer: Self-pay | Admitting: Emergency Medicine

## 2023-07-10 DIAGNOSIS — J329 Chronic sinusitis, unspecified: Secondary | ICD-10-CM

## 2023-07-10 DIAGNOSIS — R051 Acute cough: Secondary | ICD-10-CM

## 2023-07-10 MED ORDER — AMOXICILLIN-POT CLAVULANATE 875-125 MG PO TABS
1.0000 | ORAL_TABLET | Freq: Two times a day (BID) | ORAL | 0 refills | Status: DC
Start: 2023-07-10 — End: 2023-07-15

## 2023-07-10 MED ORDER — BENZONATATE 100 MG PO CAPS: 100.0000 mg | ORAL_CAPSULE | Freq: Two times a day (BID) | ORAL | 0 refills | Status: DC | PRN

## 2023-07-10 NOTE — Progress Notes (Signed)
 Virtual Visit Consent   Judy Garcia, you are scheduled for a virtual visit with a [facility] provider today. Just as with appointments in the office, your consent must be obtained to participate. Your consent will be active for this visit and any virtual visit you may have with one of our providers in the next 365 days. If you have a MyChart account, a copy of this consent can be sent to you electronically.  As this is a virtual visit, video technology does not allow for your provider to perform a traditional examination. This may limit your provider's ability to fully assess your condition. If your provider identifies any concerns that need to be evaluated in person or the need to arrange testing (such as labs, EKG, etc.), we will make arrangements to do so. Although advances in technology are sophisticated, we cannot ensure that it will always work on either your end or our end. If the connection with a video visit is poor, the visit may have to be switched to a telephone visit. With either a video or telephone visit, we are not always able to ensure that we have a secure connection.  By engaging in this virtual visit, you consent to the provision of healthcare and authorize for your insurance to be billed (if applicable) for the services provided during this visit. Depending on your insurance coverage, you may receive a charge related to this service.  I need to obtain your verbal consent now. Are you willing to proceed with your visit today? Judy Garcia has provided verbal consent on 07/10/2023 for a virtual visit (video or telephone). Lamar Schlossman, PA-C  Date: 07/10/2023 11:52 AM  Virtual Visit via Video Note   I, Lamar Schlossman, connected with  Judy Garcia  ([id number], May 30, 1971) on 07/10/23 at 11:45 AM EST by a video-enabled telemedicine application and verified that I am speaking with the correct person using two identifiers.  Location: Patient: Virtual Visit Location Patient:  Home Provider: Virtual Visit Location Provider: Home Office   I discussed the limitations of evaluation and management by telemedicine and the availability of in person appointments. The patient expressed understanding and agreed to proceed.    History of Present Illness: Judy Garcia is a 53 y.o. who identifies as a female who was assigned female at birth, and is being seen today for sinus pressure and congestion for the past several days.  States that she has associated cough and hoarseness.  States that she has had acutely worsening symptoms today.  States that she is having thick productive cough.  Has been using an inhaler.  HPI: HPI  Problems:  Patient Active Problem List   Diagnosis Date Noted   HLD (hyperlipidemia) 05/04/2023   Increased pressure in the eye, bilateral 04/27/2023   Bronchitis 04/21/2022   Left knee pain 01/15/2022   Foot callus 01/15/2022   Type 2 diabetes mellitus without complication, without long-term current use of insulin (HCC) 12/17/2021   Hip pain, right 04/15/2021   Acute pain of right knee 04/15/2021   Leg swelling 04/15/2021   Fall 04/15/2021   Atypical mole 04/15/2021   Soft tissue lesion 01/29/2021   Epistaxis 01/29/2021   Ear fullness 01/29/2021   Abnormal partial thromboplastin time (PTT) 01/29/2021   Chronic bronchitis (HCC) 01/02/2021   Anxiety and depression 01/02/2021   Sleep disturbance 08/11/2019   Back pain 08/09/2019   History of lipoma 08/09/2019   Lipoma of face 07/28/2018   Onychomycosis 07/28/2018   Essential hypertension 07/28/2018  History of alcohol abuse 07/28/2018   Menopausal symptoms 07/28/2018   History of substance abuse (HCC) 01/17/2015   Morbid obesity (HCC) 05/11/2014   Nicotine  dependence, cigarettes, uncomplicated 10/25/2013   Generalized hyperhidrosis 09/14/2012   Leiomyoma of uterus 10/30/2010    Allergies:  Allergies  Allergen Reactions   Lisinopril Swelling    Angioedema. ng   Medications:   Current Outpatient Medications:    amoxicillin -clavulanate (AUGMENTIN ) 875-125 MG tablet, Take 1 tablet by mouth every 12 (twelve) hours., Disp: 14 tablet, Rfl: 0   benzonatate  (TESSALON ) 100 MG capsule, Take 1 capsule (100 mg total) by mouth 2 (two) times daily as needed for cough., Disp: 20 capsule, Rfl: 0   Accu-Chek Softclix Lancets lancets, Use as instructed, Disp: 100 each, Rfl: 12   albuterol  (VENTOLIN  HFA) 108 (90 Base) MCG/ACT inhaler, INHALE 1 TO 2 PUFFS INTO THE LUNGS EVERY 6 HOURS AS NEEDED FOR WHEEZING OR SHORTNESS OF BREATH, Disp: 54 g, Rfl: 0   amLODipine  (NORVASC ) 10 MG tablet, TAKE 1 TABLET(10 MG) BY MOUTH DAILY, Disp: 90 tablet, Rfl: 1   amLODipine  (NORVASC ) 10 MG tablet, TAKE 1 TABLET(10 MG) BY MOUTH DAILY, Disp: 30 tablet, Rfl: 0   Ascorbic Acid (VITAMIN C) 100 MG tablet, Take 100 mg by mouth as needed., Disp: , Rfl:    azelastine  (ASTELIN ) 0.1 % nasal spray, Place into the nose., Disp: , Rfl:    blood glucose meter kit and supplies, Dispense based on patient and insurance preference. Use up to four times daily as directed. (FOR ICD-10 E10.9, E11.9)., Disp: 1 each, Rfl: 0   buPROPion  (WELLBUTRIN  SR) 150 MG 12 hr tablet, Take 1 tablet (150 mg total) by mouth daily for 3 days, THEN 1 tablet (150 mg total) 2 (two) times daily., Disp: 183 tablet, Rfl: 1   busPIRone  (BUSPAR ) 7.5 MG tablet, Take 1 tablet (7.5 mg total) by mouth 2 (two) times daily., Disp: 60 tablet, Rfl: 3   docusate sodium  (COLACE) 100 MG capsule, Take 1 capsule (100 mg total) by mouth daily., Disp: 90 capsule, Rfl: 1   empagliflozin  (JARDIANCE ) 10 MG TABS tablet, Take 1 tablet (10 mg total) by mouth daily before breakfast., Disp: 30 tablet, Rfl: 2   fluticasone  furoate-vilanterol (BREO ELLIPTA ) 100-25 MCG/ACT AEPB, Inhale 1 puff into the lungs daily., Disp: 1 each, Rfl: 11   glucose blood (ACCU-CHEK GUIDE) test strip, Use as instructed, Disp: 100 each, Rfl: 12   metFORMIN  (GLUCOPHAGE -XR) 500 MG 24 hr tablet, TAKE 1  TABLET(500 MG) BY MOUTH TWICE DAILY WITH A MEAL, Disp: 60 tablet, Rfl: 3   Multiple Vitamin (MULTIVITAMIN) tablet, Take 1 tablet by mouth daily., Disp: , Rfl:    nicotine  (NICODERM CQ  - DOSED IN MG/24 HOURS) 21 mg/24hr patch, Place 1 patch (21 mg total) onto the skin daily., Disp: 28 patch, Rfl: 3   nicotine  polacrilex (NICORETTE ) 4 MG gum, Take 1 each (4 mg total) by mouth as needed for smoking cessation., Disp: 100 tablet, Rfl: 3   ondansetron  (ZOFRAN ) 4 MG tablet, Take 1 tablet (4 mg total) by mouth every 8 (eight) hours as needed for nausea or vomiting., Disp: 20 tablet, Rfl: 0   PROAIR  RESPICLICK 108 (90 Base) MCG/ACT AEPB, INHALE 2 PUFFS INTO THE LUNGS FOUR TIMES DAILY AS NEEDED, Disp: 1 each, Rfl: 0   rosuvastatin  (CRESTOR ) 40 MG tablet, Take 1 tablet (40 mg total) by mouth daily., Disp: 90 tablet, Rfl: 3   spironolactone  (ALDACTONE ) 25 MG tablet, TAKE 1/2 TABLET(12.5 MG) BY  MOUTH DAILY, Disp: 45 tablet, Rfl: 0   venlafaxine  XR (EFFEXOR -XR) 75 MG 24 hr capsule, TAKE 3 CAPSULES BY MOUTH DAILY WITH BREAKFAST, Disp: 270 capsule, Rfl: 0  Observations/Objective: Patient is well-developed, well-nourished in no acute distress.  Resting comfortably at home.  Head is normocephalic, atraumatic.  No labored breathing.  Speech is clear and coherent with logical content.  Patient is alert and oriented at baseline.    Assessment and Plan: 1. Acute cough (Primary)  2. Sinusitis, unspecified chronicity, unspecified location  Meds ordered this encounter  Medications   amoxicillin -clavulanate (AUGMENTIN ) 875-125 MG tablet    Sig: Take 1 tablet by mouth every 12 (twelve) hours.    Dispense:  14 tablet    Refill:  0    Supervising Provider:   LAMPTEY, PHILIP O [[id number]]   benzonatate  (TESSALON ) 100 MG capsule    Sig: Take 1 capsule (100 mg total) by mouth 2 (two) times daily as needed for cough.    Dispense:  20 capsule    Refill:  0    Supervising Provider:   BLAISE ALEENE KIDD [[id number]]      Follow Up Instructions: I discussed the assessment and treatment plan with the patient. The patient was provided an opportunity to ask questions and all were answered. The patient agreed with the plan and demonstrated an understanding of the instructions.  A copy of instructions were sent to the patient via MyChart unless otherwise noted below.     The patient was advised to call back or seek an in-person evaluation if the symptoms worsen or if the condition fails to improve as anticipated.    Lamar Schlossman, PA-C

## 2023-07-10 NOTE — Patient Instructions (Addendum)
 Judy Garcia, thank you for joining Lamar Schlossman, PA-C for today's virtual visit.  While this provider is not your primary care provider (PCP), if your PCP is located in our provider database this encounter information will be shared with them immediately following your visit.   A [facility] MyChart account gives you access to today's visit and all your visits, tests, and labs performed at [facility]  click here if you don't have a [facility] MyChart account or go to [email or web address]  Consent: (Patient) Judy Garcia provided verbal consent for this virtual visit at the beginning of the encounter.  Current Medications:  Current Outpatient Medications:    amoxicillin -clavulanate (AUGMENTIN ) 875-125 MG tablet, Take 1 tablet by mouth every 12 (twelve) hours., Disp: 14 tablet, Rfl: 0   benzonatate  (TESSALON ) 100 MG capsule, Take 1 capsule (100 mg total) by mouth 2 (two) times daily as needed for cough., Disp: 20 capsule, Rfl: 0   Accu-Chek Softclix Lancets lancets, Use as instructed, Disp: 100 each, Rfl: 12   albuterol  (VENTOLIN  HFA) 108 (90 Base) MCG/ACT inhaler, INHALE 1 TO 2 PUFFS INTO THE LUNGS EVERY 6 HOURS AS NEEDED FOR WHEEZING OR SHORTNESS OF BREATH, Disp: 54 g, Rfl: 0   amLODipine  (NORVASC ) 10 MG tablet, TAKE 1 TABLET(10 MG) BY MOUTH DAILY, Disp: 90 tablet, Rfl: 1   amLODipine  (NORVASC ) 10 MG tablet, TAKE 1 TABLET(10 MG) BY MOUTH DAILY, Disp: 30 tablet, Rfl: 0   Ascorbic Acid (VITAMIN C) 100 MG tablet, Take 100 mg by mouth as needed., Disp: , Rfl:    azelastine  (ASTELIN ) 0.1 % nasal spray, Place into the nose., Disp: , Rfl:    blood glucose meter kit and supplies, Dispense based on patient and insurance preference. Use up to four times daily as directed. (FOR ICD-10 E10.9, E11.9)., Disp: 1 each, Rfl: 0   buPROPion  (WELLBUTRIN  SR) 150 MG 12 hr tablet, Take 1 tablet (150 mg total) by mouth daily for 3 days, THEN 1 tablet (150 mg total) 2 (two) times daily.,  Disp: 183 tablet, Rfl: 1   busPIRone  (BUSPAR ) 7.5 MG tablet, Take 1 tablet (7.5 mg total) by mouth 2 (two) times daily., Disp: 60 tablet, Rfl: 3   docusate sodium  (COLACE) 100 MG capsule, Take 1 capsule (100 mg total) by mouth daily., Disp: 90 capsule, Rfl: 1   empagliflozin  (JARDIANCE ) 10 MG TABS tablet, Take 1 tablet (10 mg total) by mouth daily before breakfast., Disp: 30 tablet, Rfl: 2   fluticasone  furoate-vilanterol (BREO ELLIPTA ) 100-25 MCG/ACT AEPB, Inhale 1 puff into the lungs daily., Disp: 1 each, Rfl: 11   glucose blood (ACCU-CHEK GUIDE) test strip, Use as instructed, Disp: 100 each, Rfl: 12   metFORMIN  (GLUCOPHAGE -XR) 500 MG 24 hr tablet, TAKE 1 TABLET(500 MG) BY MOUTH TWICE DAILY WITH A MEAL, Disp: 60 tablet, Rfl: 3   Multiple Vitamin (MULTIVITAMIN) tablet, Take 1 tablet by mouth daily., Disp: , Rfl:    nicotine  (NICODERM CQ  - DOSED IN MG/24 HOURS) 21 mg/24hr patch, Place 1 patch (21 mg total) onto the skin daily., Disp: 28 patch, Rfl: 3   nicotine  polacrilex (NICORETTE ) 4 MG gum, Take 1 each (4 mg total) by mouth as needed for smoking cessation., Disp: 100 tablet, Rfl: 3   ondansetron  (ZOFRAN ) 4 MG tablet, Take 1 tablet (4 mg total) by mouth every 8 (eight) hours as needed for nausea or vomiting., Disp: 20 tablet, Rfl: 0   PROAIR  RESPICLICK 108 (90 Base) MCG/ACT AEPB, INHALE 2 PUFFS INTO THE LUNGS  FOUR TIMES DAILY AS NEEDED, Disp: 1 each, Rfl: 0   rosuvastatin  (CRESTOR ) 40 MG tablet, Take 1 tablet (40 mg total) by mouth daily., Disp: 90 tablet, Rfl: 3   spironolactone  (ALDACTONE ) 25 MG tablet, TAKE 1/2 TABLET(12.5 MG) BY MOUTH DAILY, Disp: 45 tablet, Rfl: 0   venlafaxine  XR (EFFEXOR -XR) 75 MG 24 hr capsule, TAKE 3 CAPSULES BY MOUTH DAILY WITH BREAKFAST, Disp: 270 capsule, Rfl: 0   Medications ordered in this encounter:  Meds ordered this encounter  Medications   amoxicillin -clavulanate (AUGMENTIN ) 875-125 MG tablet    Sig: Take 1 tablet by mouth every 12 (twelve) hours.     Dispense:  14 tablet    Refill:  0    Supervising Provider:   LAMPTEY, PHILIP O [[id number]]   benzonatate  (TESSALON ) 100 MG capsule    Sig: Take 1 capsule (100 mg total) by mouth 2 (two) times daily as needed for cough.    Dispense:  20 capsule    Refill:  0    Supervising Provider:   BLAISE ALEENE KIDD [[id number]]     *If you need refills on other medications prior to your next appointment, please contact your pharmacy*  Follow-Up: Call back or seek an in-person evaluation if the symptoms worsen or if the condition fails to improve as anticipated.  [facility] [phone]  Other Instructions    If you have been instructed to have an in-person evaluation today at a local Urgent Care facility, please use the link below. It will take you to a list of all of our available [facility], including address, phone number and hours of operation. Please do not delay care.  [facility]  If you or a family member do not have a primary care provider, use the link below to schedule a visit and establish care. When you choose a [facility] primary care physician or advanced practice provider, you gain a long-term partner in health. Find a Primary Care Provider  Learn more about [facility] in-office and virtual care options:  - Get Care Now

## 2023-07-15 ENCOUNTER — Encounter: Payer: Self-pay | Admitting: Family Medicine

## 2023-07-15 ENCOUNTER — Ambulatory Visit (INDEPENDENT_AMBULATORY_CARE_PROVIDER_SITE_OTHER): Payer: Self-pay | Admitting: Family Medicine

## 2023-07-15 VITALS — BP 126/74 | HR 95 | Temp 99.1°F | Wt 231.0 lb

## 2023-07-15 DIAGNOSIS — F419 Anxiety disorder, unspecified: Secondary | ICD-10-CM

## 2023-07-15 DIAGNOSIS — F32A Depression, unspecified: Secondary | ICD-10-CM

## 2023-07-15 DIAGNOSIS — J42 Unspecified chronic bronchitis: Secondary | ICD-10-CM

## 2023-07-15 MED ORDER — PREDNISONE 20 MG PO TABS: 40.0000 mg | ORAL_TABLET | Freq: Every day | ORAL | 0 refills | Status: DC

## 2023-07-15 MED ORDER — DOXYCYCLINE HYCLATE 100 MG PO TABS
100.0000 mg | ORAL_TABLET | Freq: Two times a day (BID) | ORAL | 0 refills | Status: DC
Start: 2023-07-15 — End: 2023-07-28

## 2023-07-15 MED ORDER — TRAZODONE HCL 50 MG PO TABS: 25.0000 mg | ORAL_TABLET | Freq: Every evening | ORAL | 0 refills | Status: DC | PRN

## 2023-07-15 NOTE — Assessment & Plan Note (Signed)
Chronic issue.  Worsened recently with grief related to her husband passing away.  She will continue venlafaxine 225 mg daily.  She will continue to work on getting into grief counseling.  We will prescribe trazodone 25-50 mg nightly as needed for sleep.  She will monitor for excessive drowsiness after taking this.  Follow-up as planned in a couple of weeks.

## 2023-07-15 NOTE — Assessment & Plan Note (Addendum)
Patient with chronic bronchitis with likely exacerbation currently and also sinus infection.  We will treat with prednisone 40 mg daily with her starting this dose in the morning.  She will also take doxycycline 100 mg twice daily.  Discussed risk of diarrhea and skin sensitivity to the sun with this.  Discussed the risk of sleep issues with the prednisone.  If she has excessive sleep issues or excessive diarrhea she will let us know.  If she develops worsening shortness of breath, cough productive of blood, or fevers that do not come down with Tylenol she will seek medical attention.  She will continue to use her albuterol inhaler.

## 2023-07-15 NOTE — Progress Notes (Signed)
Marikay Alar, MD Phone: [phone]  Judy Garcia is a 53 y.o. female who presents today for same day visit.   Grief: Patient husband passed away from a massive heart attack.  She notes he had a heart attack right in front of her.  She notes no SI.  She is planning on doing some grief counseling.  She is on venlafaxine 225 mg daily.  She has support and others that she can talk to.  Respiratory infection: Patient notes she was seen virtually last week and placed on Augmentin.  She notes no significant improvement.  Continues to cough up yellow mucus.  Blowing some yellow mucus out of her nose.  Feels very congested in her sinuses.  Has some postnasal drip and sore throat.  Her teeth have been hurting.  Has occasional chills.  Some shortness of breath and wheezing.  Social History   Tobacco Use  Smoking Status Every Day   Current packs/day: 0.50   Average packs/day: 0.5 packs/day for 25.0 years (12.5 ttl pk-yrs)   Types: Cigarettes  Smokeless Tobacco Never    Current Outpatient Medications on File Prior to Visit  Medication Sig Dispense Refill   Accu-Chek Softclix Lancets lancets Use as instructed 100 each 12   albuterol (VENTOLIN HFA) 108 (90 Base) MCG/ACT inhaler INHALE 1 TO 2 PUFFS INTO THE LUNGS EVERY 6 HOURS AS NEEDED FOR WHEEZING OR SHORTNESS OF BREATH 54 g 0   amLODipine (NORVASC) 10 MG tablet TAKE 1 TABLET(10 MG) BY MOUTH DAILY 90 tablet 1   amLODipine (NORVASC) 10 MG tablet TAKE 1 TABLET(10 MG) BY MOUTH DAILY 30 tablet 0   Ascorbic Acid (VITAMIN C) 100 MG tablet Take 100 mg by mouth as needed.     azelastine (ASTELIN) 0.1 % nasal spray Place into the nose.     benzonatate (TESSALON) 100 MG capsule Take 1 capsule (100 mg total) by mouth 2 (two) times daily as needed for cough. 20 capsule 0   blood glucose meter kit and supplies Dispense based on patient and insurance preference. Use up to four times daily as directed. (FOR ICD-10 E10.9, E11.9). 1 each 0   docusate sodium  (COLACE) 100 MG capsule Take 1 capsule (100 mg total) by mouth daily. 90 capsule 1   empagliflozin (JARDIANCE) 10 MG TABS tablet Take 1 tablet (10 mg total) by mouth daily before breakfast. 30 tablet 2   fluticasone furoate-vilanterol (BREO ELLIPTA) 100-25 MCG/ACT AEPB Inhale 1 puff into the lungs daily. 1 each 11   glucose blood (ACCU-CHEK GUIDE) test strip Use as instructed 100 each 12   metFORMIN (GLUCOPHAGE-XR) 500 MG 24 hr tablet TAKE 1 TABLET(500 MG) BY MOUTH TWICE DAILY WITH A MEAL 60 tablet 3   Multiple Vitamin (MULTIVITAMIN) tablet Take 1 tablet by mouth daily.     nicotine (NICODERM CQ - DOSED IN MG/24 HOURS) 21 mg/24hr patch Place 1 patch (21 mg total) onto the skin daily. 28 patch 3   nicotine polacrilex (NICORETTE) 4 MG gum Take 1 each (4 mg total) by mouth as needed for smoking cessation. 100 tablet 3   ondansetron (ZOFRAN) 4 MG tablet Take 1 tablet (4 mg total) by mouth every 8 (eight) hours as needed for nausea or vomiting. 20 tablet 0   PROAIR RESPICLICK 108 (90 Base) MCG/ACT AEPB INHALE 2 PUFFS INTO THE LUNGS FOUR TIMES DAILY AS NEEDED 1 each 0   rosuvastatin (CRESTOR) 40 MG tablet Take 1 tablet (40 mg total) by mouth daily. 90 tablet 3  spironolactone (ALDACTONE) 25 MG tablet TAKE 1/2 TABLET(12.5 MG) BY MOUTH DAILY 45 tablet 0   venlafaxine XR (EFFEXOR-XR) 75 MG 24 hr capsule TAKE 3 CAPSULES BY MOUTH DAILY WITH BREAKFAST 270 capsule 0   No current facility-administered medications on file prior to visit.     ROS see history of present illness  Objective  Physical Exam Vitals:   07/15/23 1548 07/15/23 1611  BP: (!) 142/78 126/74  Pulse: 95   Temp: 99.1 F (37.3 C)   SpO2: 98%     BP Readings from Last 3 Encounters:  07/15/23 126/74  04/27/23 128/76  04/21/22 132/82   Wt Readings from Last 3 Encounters:  07/15/23 231 lb (104.8 kg)  04/27/23 230 lb 9.6 oz (104.6 kg)  04/21/22 226 lb (102.5 kg)    Physical Exam Constitutional:      General: She is not in  acute distress.    Appearance: She is not diaphoretic.  Cardiovascular:     Rate and Rhythm: Normal rate and regular rhythm.     Heart sounds: Normal heart sounds.  Pulmonary:     Effort: Pulmonary effort is normal.     Comments: Coarse breath sounds and expiratory wheezing throughout Skin:    General: Skin is warm and dry.  Neurological:     Mental Status: She is alert.      Assessment/Plan: Please see individual problem list.  Anxiety and depression Assessment & Plan: Chronic issue.  Worsened recently with grief related to her husband passing away.  She will continue venlafaxine 225 mg daily.  She will continue to work on getting into grief counseling.  We will prescribe trazodone 25-50 mg nightly as needed for sleep.  She will monitor for excessive drowsiness after taking this.  Follow-up as planned in a couple of weeks.  Orders: -     traZODone HCl; Take 0.5-1 tablets (25-50 mg total) by mouth at bedtime as needed for sleep.  Dispense: 30 tablet; Refill: 0  Chronic bronchitis, unspecified chronic bronchitis type (HCC) Assessment & Plan: Patient with chronic bronchitis with likely exacerbation currently and also sinus infection.  We will treat with prednisone 40 mg daily with her starting this dose in the morning.  She will also take doxycycline 100 mg twice daily.  Discussed risk of diarrhea and skin sensitivity to the sun with this.  Discussed the risk of sleep issues with the prednisone.  If she has excessive sleep issues or excessive diarrhea she will let us know.  If she develops worsening shortness of breath, cough productive of blood, or fevers that do not come down with Tylenol she will seek medical attention.  She will continue to use her albuterol inhaler.  Orders: -     predniSONE; Take 2 tablets (40 mg total) by mouth daily with breakfast.  Dispense: 10 tablet; Refill: 0 -     Doxycycline Hyclate; Take 1 tablet (100 mg total) by mouth 2 (two) times daily.  Dispense: 14  tablet; Refill: 0     Return for As scheduled.   Marikay Alar, MD [facility]

## 2023-07-15 NOTE — Patient Instructions (Signed)
Nice to see you. We are going to treat you with doxycycline and prednisone.  If you develop excessive diarrhea or you have extreme difficulty trying to sleep on these medications please let us know. Please try the trazodone for your sleep.  If it makes you excessively drowsy please discontinue use and let us know.

## 2023-07-20 IMAGING — US US BREAST*L* LIMITED INC AXILLA
1 series · 9 of 9 positions shown · non-contrast
Comparison: Previous exams, most recently 06/08/2014 from [REDACTED] in [facility] [HOSPITAL].

CLINICAL DATA: 51-year-old with a possible palpable lump associated
with intermittent pain in the inner LEFT breast. Annual evaluation,
RIGHT breast.

EXAM:
DIGITAL DIAGNOSTIC BILATERAL MAMMOGRAM WITH TOMOSYNTHESIS AND CAD;
ULTRASOUND LEFT BREAST LIMITED
TECHNIQUE: Bilateral digital diagnostic mammography and breast tomosynthesis
was performed. The images were evaluated with computer-aided
detection.; Targeted ultrasound examination of the left breast was
performed.

[Series 1: us breast*left* limited inc axilla · 0.06mm/px · 9 of 9 slices shown]
[im 1/9]
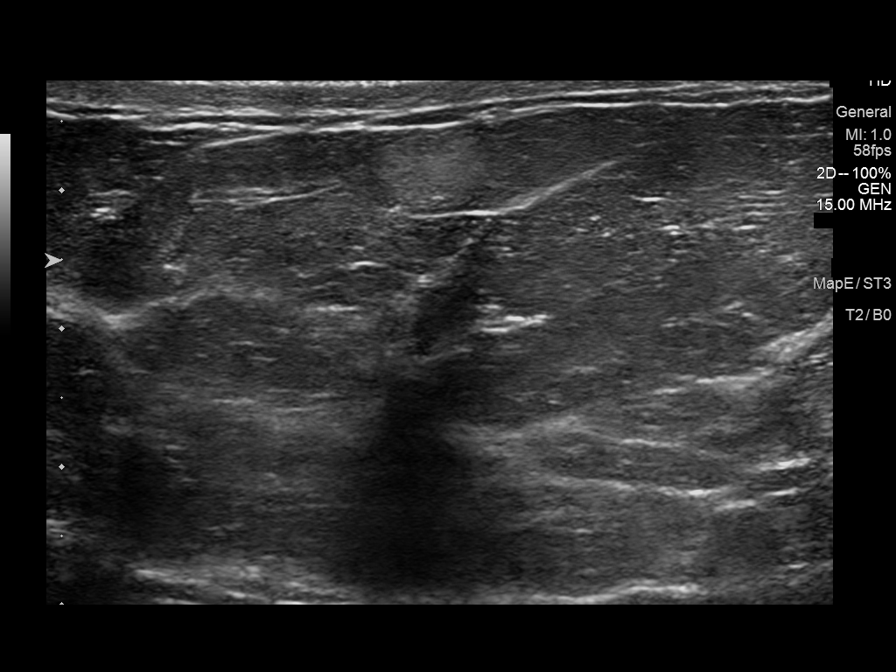
[im 2/9]
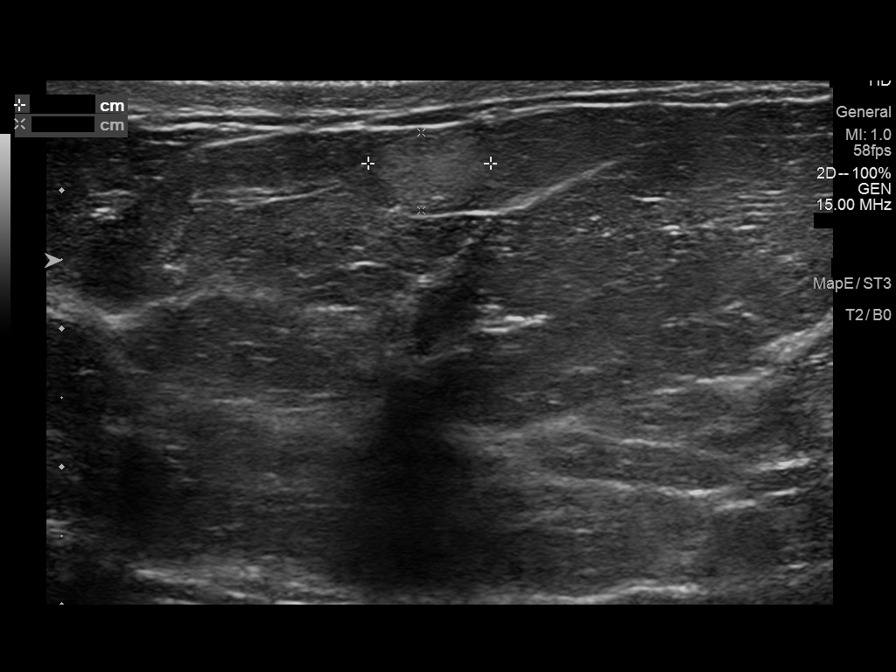
[im 3/9]
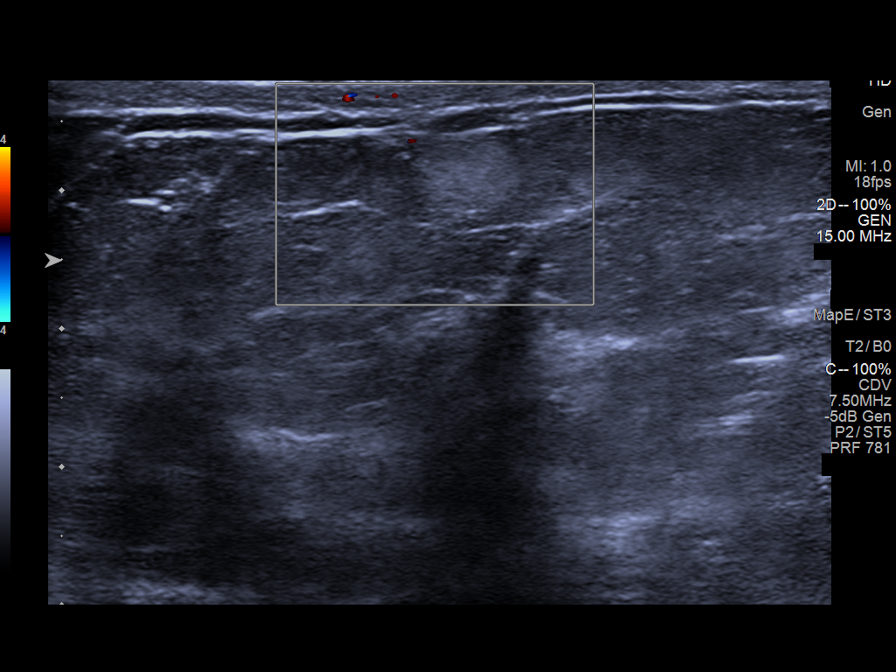
[im 4/9]
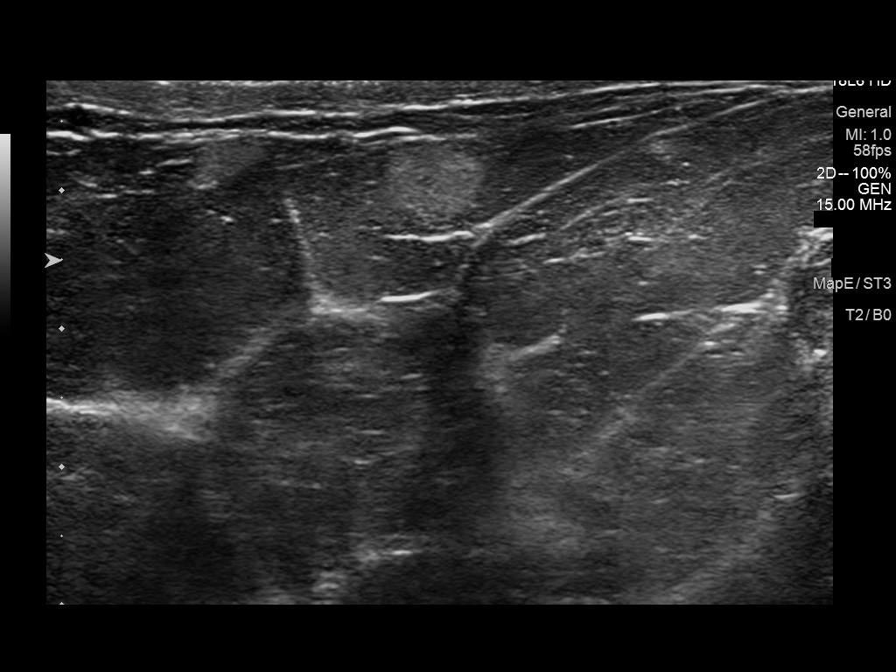
[im 5/9]
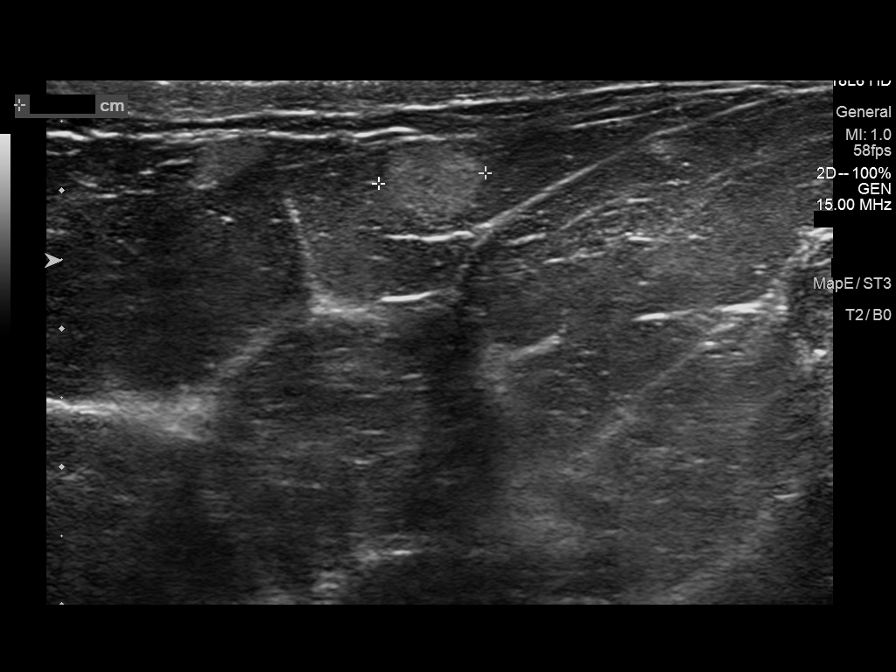
[im 6/9]
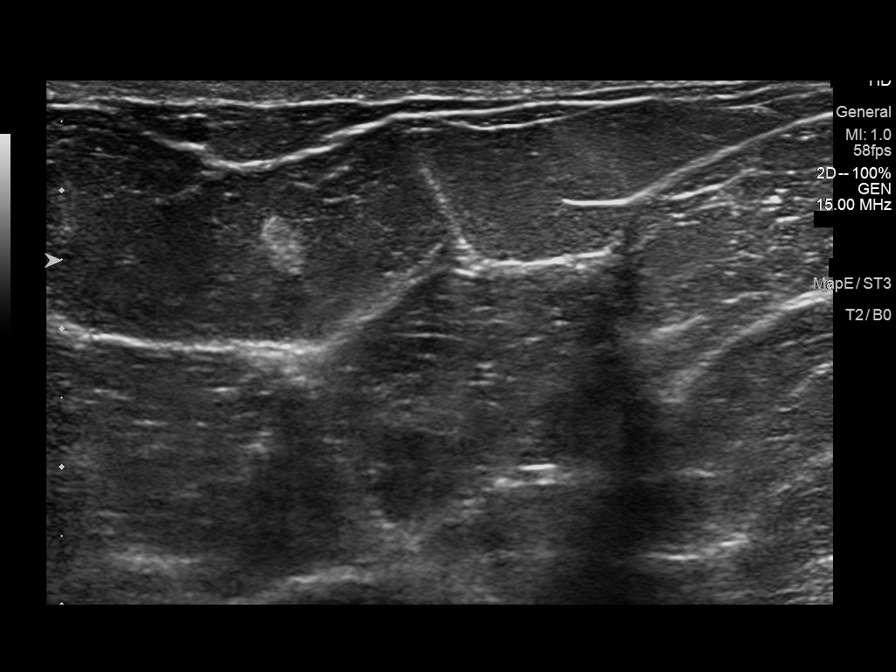
[im 7/9]
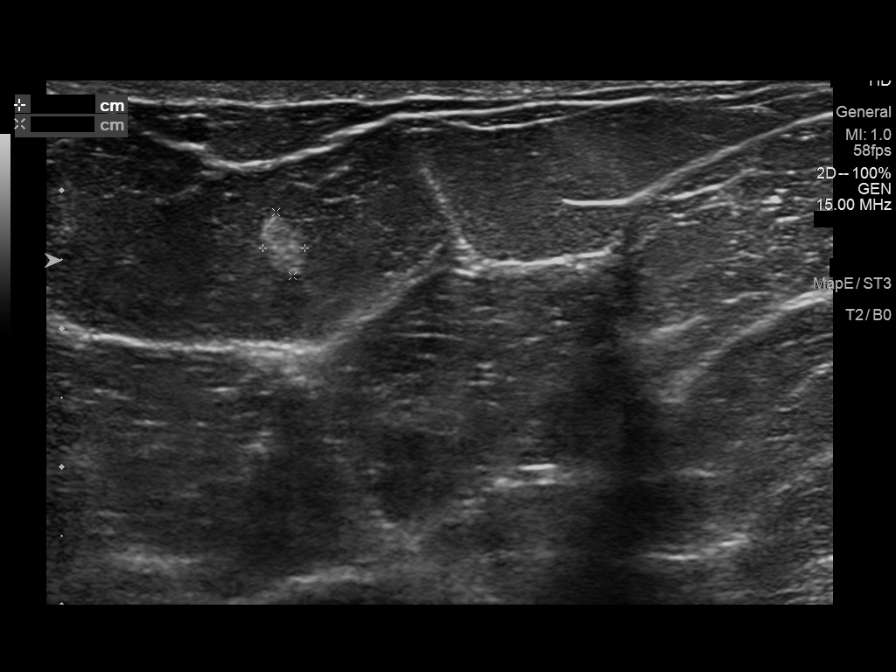
[im 8/9]
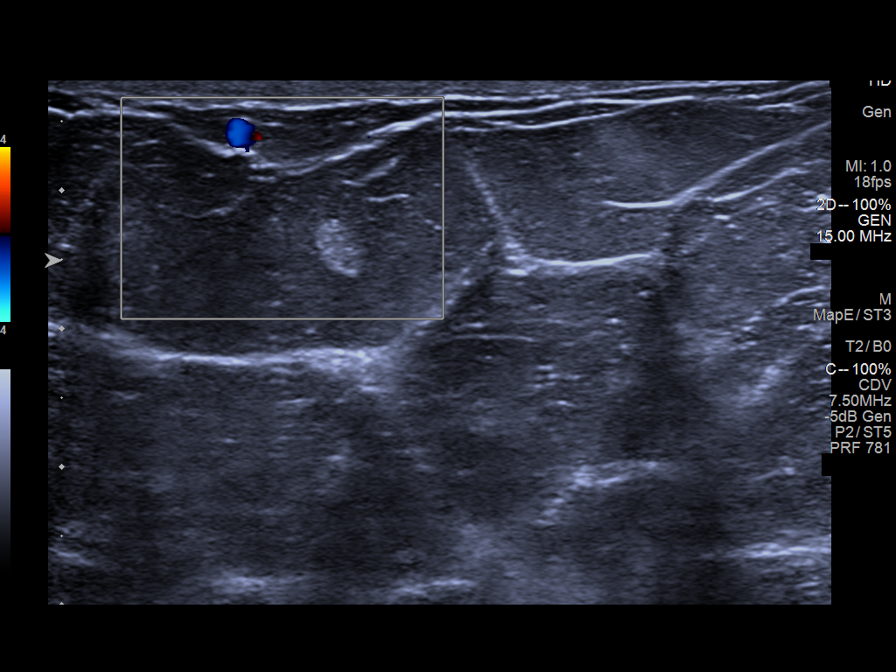
[im 9/9]
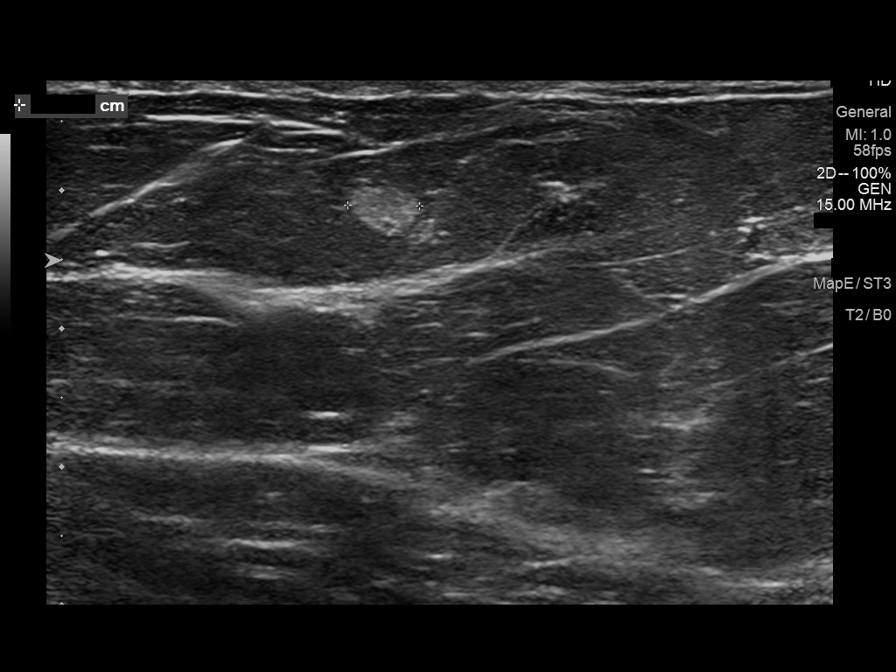

[9 of 9 positions shown; findings below may reference images not displayed]

ACR Breast Density Category b: There are scattered areas of
fibroglandular density.
FINDINGS: Full field CC and MLO views of both breasts and a spot tangential
view of the palpable concern in the LEFT breast were obtained. The
palpable concern was marked with a metallic BB.

RIGHT: No findings suspicious for malignancy.

LEFT: No findings suspicious for malignancy. No mammographic
abnormality in the area of palpable concern and focal pain in the
inner breast.

Targeted ultrasound is performed in the area of palpable concern,
demonstrating circumscribed hyperechoic foci within adjacent
subcutaneous fat lobules. At the 9:30 o'clock position 6 cm from the
nipple the hyperechoic focus measures 9 x 6 x 8 mm. At the 10
o'clock position 6 cm from the nipple the hyperechoic focus measures
5 x 3 x 5 mm. There is no internal power Doppler flow. No suspicious
solid mass or abnormal acoustic shadowing is identified.

On correlative physical examination, there is a freely mobile
palpable approximate 1 cm fluctuant mass in the inner breast
corresponding to what the patient is feeling. She describes mild
tenderness palpation.
IMPRESSION: 1. No mammographic or sonographic evidence of malignancy involving
the LEFT breast.
2. No mammographic evidence of malignancy involving the RIGHT
breast.
3. Benign fat necrosis in the inner LEFT breast which accounts for
the clinical concern.

RECOMMENDATION:
Screening mammogram in one year.(Code:37-0-LXC)

I have discussed the findings and recommendations with the patient.
If applicable, a reminder letter will be sent to the patient
regarding the next appointment.

BI-RADS CATEGORY  2: Benign.

## 2023-07-28 ENCOUNTER — Encounter: Payer: Self-pay | Admitting: Nurse Practitioner

## 2023-07-28 ENCOUNTER — Ambulatory Visit (INDEPENDENT_AMBULATORY_CARE_PROVIDER_SITE_OTHER): Payer: Self-pay | Admitting: Nurse Practitioner

## 2023-07-28 VITALS — BP 128/66 | HR 85 | Temp 98.3°F | Ht 61.0 in | Wt 230.0 lb

## 2023-07-28 DIAGNOSIS — E7800 Pure hypercholesterolemia, unspecified: Secondary | ICD-10-CM

## 2023-07-28 DIAGNOSIS — M799 Soft tissue disorder, unspecified: Secondary | ICD-10-CM

## 2023-07-28 DIAGNOSIS — Z7984 Long term (current) use of oral hypoglycemic drugs: Secondary | ICD-10-CM

## 2023-07-28 DIAGNOSIS — E119 Type 2 diabetes mellitus without complications: Secondary | ICD-10-CM

## 2023-07-28 DIAGNOSIS — F4321 Adjustment disorder with depressed mood: Secondary | ICD-10-CM

## 2023-07-28 DIAGNOSIS — F4320 Adjustment disorder, unspecified: Secondary | ICD-10-CM

## 2023-07-28 DIAGNOSIS — F32A Depression, unspecified: Secondary | ICD-10-CM

## 2023-07-28 DIAGNOSIS — F419 Anxiety disorder, unspecified: Secondary | ICD-10-CM

## 2023-07-28 DIAGNOSIS — I10 Essential (primary) hypertension: Secondary | ICD-10-CM

## 2023-07-28 DIAGNOSIS — R110 Nausea: Secondary | ICD-10-CM

## 2023-07-28 MED ORDER — LORAZEPAM 0.5 MG PO TABS: 0.5000 mg | ORAL_TABLET | Freq: Three times a day (TID) | ORAL | 2 refills | Status: DC | PRN

## 2023-07-28 MED ORDER — AZELASTINE HCL 0.1 % NA SOLN: 1.0000 | Freq: Two times a day (BID) | NASAL | 5 refills | Status: DC

## 2023-07-28 MED ORDER — ONDANSETRON HCL 4 MG PO TABS: 4.0000 mg | ORAL_TABLET | Freq: Three times a day (TID) | ORAL | 0 refills | Status: DC | PRN

## 2023-07-28 NOTE — Progress Notes (Signed)
 Bethanie Dicker, NP-C Phone: [phone]  Judy Garcia is a 53 y.o. female who presents today for transfer of care.   Discussed the use of AI scribe software for clinical note transcription with the patient, who gave verbal consent to proceed.  History of Present Illness   Judy Garcia is a 53 year old female who presents with concerns about medication access and anxiety following her husband's recent death. Her son is currently staying with her for support.  She has diabetes and is concerned about her medication regimen, particularly access to [place] due to lack of insurance. She checks her blood sugar at home, with readings typically around 116 to 119 mg/dL, and never exceeding 295 mg/dL. She experiences low blood sugar in the mornings, leading to feelings of sickness. Her last recorded A1c was 6.3% in November. She is currently on metformin twice daily and was previously prescribed Jardiance due to protein in her urine, but has not started it due to insurance issues.  She is experiencing significant anxiety and sleep disturbances following the sudden death of her husband from a heart attack approximately four to five weeks ago. She describes being 'really, really anxious all the time' and unable to sleep. She is currently taking Effexor XR for menopause-related symptoms and trazodone for sleep, but finds trazodone leaves her feeling unable to function the next day. She has started grief counseling and is actively seeking support from her son and her recovery community.  Her past medical history includes a lipoma on her back, which was evaluated with x-rays showing a cluster near the spine and scapula. She experiences pain managed with daily Tylenol, though she is aware of the potential liver implications. She was previously referred for surgical evaluation but is hesitant about undergoing a five-hour surgery.  She is on rosuvastatin for cholesterol management, which she started recently. No  new abdominal pain, muscle aches, chest pain, or dizziness, but reports occasional shortness of breath. She does not regularly check her blood pressure at home but is currently taking spironolactone and amlodipine. Her blood pressure is well-controlled at 128/66 mmHg.      Social History   Tobacco Use  Smoking Status Every Day   Current packs/day: 0.50   Average packs/day: 0.5 packs/day for 25.0 years (12.5 ttl pk-yrs)   Types: Cigarettes  Smokeless Tobacco Never    Current Outpatient Medications on File Prior to Visit  Medication Sig Dispense Refill   Accu-Chek Softclix Lancets lancets Use as instructed 100 each 12   albuterol (VENTOLIN HFA) 108 (90 Base) MCG/ACT inhaler INHALE 1 TO 2 PUFFS INTO THE LUNGS EVERY 6 HOURS AS NEEDED FOR WHEEZING OR SHORTNESS OF BREATH 54 g 0   amLODipine (NORVASC) 10 MG tablet TAKE 1 TABLET(10 MG) BY MOUTH DAILY 90 tablet 1   Ascorbic Acid (VITAMIN C) 100 MG tablet Take 100 mg by mouth as needed.     blood glucose meter kit and supplies Dispense based on patient and insurance preference. Use up to four times daily as directed. (FOR ICD-10 E10.9, E11.9). 1 each 0   docusate sodium (COLACE) 100 MG capsule Take 1 capsule (100 mg total) by mouth daily. 90 capsule 1   empagliflozin (JARDIANCE) 10 MG TABS tablet Take 1 tablet (10 mg total) by mouth daily before breakfast. 30 tablet 2   fluticasone furoate-vilanterol (BREO ELLIPTA) 100-25 MCG/ACT AEPB Inhale 1 puff into the lungs daily. 1 each 11   glucose blood (ACCU-CHEK GUIDE) test strip Use as instructed 100 each 12  metFORMIN (GLUCOPHAGE-XR) 500 MG 24 hr tablet TAKE 1 TABLET(500 MG) BY MOUTH TWICE DAILY WITH A MEAL 60 tablet 3   Multiple Vitamin (MULTIVITAMIN) tablet Take 1 tablet by mouth daily.     PROAIR RESPICLICK 108 (90 Base) MCG/ACT AEPB INHALE 2 PUFFS INTO THE LUNGS FOUR TIMES DAILY AS NEEDED 1 each 0   rosuvastatin (CRESTOR) 40 MG tablet Take 1 tablet (40 mg total) by mouth daily. 90 tablet 3    spironolactone (ALDACTONE) 25 MG tablet TAKE 1/2 TABLET(12.5 MG) BY MOUTH DAILY 45 tablet 0   venlafaxine XR (EFFEXOR-XR) 75 MG 24 hr capsule TAKE 3 CAPSULES BY MOUTH DAILY WITH BREAKFAST 270 capsule 0   traZODone (DESYREL) 50 MG tablet Take 0.5-1 tablets (25-50 mg total) by mouth at bedtime as needed for sleep. 30 tablet 0   No current facility-administered medications on file prior to visit.     ROS see history of present illness  Objective  Physical Exam Vitals:   07/28/23 1550  BP: 128/66  Pulse: 85  Temp: 98.3 F (36.8 C)  SpO2: 97%    BP Readings from Last 3 Encounters:  07/28/23 128/66  07/15/23 126/74  04/27/23 128/76   Wt Readings from Last 3 Encounters:  07/28/23 230 lb (104.3 kg)  07/15/23 231 lb (104.8 kg)  04/27/23 230 lb 9.6 oz (104.6 kg)    Physical Exam Constitutional:      General: She is not in acute distress.    Appearance: Normal appearance.  HENT:     Head: Normocephalic.  Cardiovascular:     Rate and Rhythm: Normal rate and regular rhythm.     Heart sounds: Normal heart sounds.  Pulmonary:     Effort: Pulmonary effort is normal.     Breath sounds: Normal breath sounds.  Skin:    General: Skin is warm and dry.  Neurological:     General: No focal deficit present.     Mental Status: She is alert.  Psychiatric:        Mood and Affect: Mood is anxious. Affect is tearful.        Behavior: Behavior normal.    Assessment/Plan: Please see individual problem list.  Grief reaction Assessment & Plan: Recent bereavement has led to increased anxiety and insomnia. Currently on Effexor XR. She recently started seeing a grief counselor which she has found helpful. Start Lorazepam 0.5 mg at bedtime and as needed during the day for anxiety. Counseled on common side effects. PDMP reviewed. Continue Effexor XR and follow up with counselor as scheduled. Plan for a 85-month follow-up to assess response to Lorazepam, sooner if needed.   Orders: -      LORazepam; Take 1 tablet (0.5 mg total) by mouth every 8 (eight) hours as needed for anxiety or sleep.  Dispense: 30 tablet; Refill: 2  Anxiety and depression Assessment & Plan: See grief plan. Continue Effexor XR. Starting on Lorazepam 0.5 mg at bedtime/as needed. Continue grief counseling.   Orders: -     LORazepam; Take 1 tablet (0.5 mg total) by mouth every 8 (eight) hours as needed for anxiety or sleep.  Dispense: 30 tablet; Refill: 2  Type 2 diabetes mellitus without complication, without long-term current use of insulin (HCC) Assessment & Plan: Diabetes is well controlled with Metformin twice daily, with an A1c of 6.3 in November. Discussed adding Jardiance for proteinuria, but blood sugars are stable and there have been episodes of hypoglycemia. Check urine for proteinuria today. If proteinuria persists, consider reducing  Metformin and adding Jardiance. Encourage continued healthy diet and regular exercise.   Orders: -     Microalbumin / creatinine urine ratio  Essential hypertension Assessment & Plan: Hypertension is well controlled on Spironolactone 12.5 mg daily and Amlodipine 10 mg daily. Continue Spironolactone and Amlodipine.  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 Previous surgical consult recommended OR removal, but declined. Refer for a second opinion regarding lipoma management.  Orders: -     Ambulatory referral to General Surgery  Pure hypercholesterolemia Assessment & Plan: On Rosuvastatin with no new symptoms. Continue Rosuvastatin 40 mg daily. Check lipid panel today.   Orders: -     Lipid panel  Nausea -     Ondansetron HCl; Take 1 tablet (4 mg total) by mouth every 8 (eight) hours as needed for nausea or vomiting.  Dispense: 20 tablet; Refill: 0  Other orders -     Azelastine HCl; Place 1 spray into both nostrils 2 (two) times daily.  Dispense: 30 mL;  Refill: 5    Return in about 4 weeks (around 08/25/2023) for Follow up.   Bethanie Dicker, NP-C [facility]

## 2023-07-29 ENCOUNTER — Telehealth: Payer: Self-pay

## 2023-07-29 LAB — MICROALBUMIN / CREATININE URINE RATIO
Creatinine,U: 65.4 mg/dL
Microalb Creat Ratio: 185.1 mg/g — ABNORMAL HIGH (ref 0.0–30.0)
Microalb, Ur: 12.1 mg/dL — ABNORMAL HIGH (ref 0.0–1.9)

## 2023-07-29 LAB — LIPID PANEL
Cholesterol: 153 mg/dL (ref 0–200)
HDL: 65.9 mg/dL (ref >39.00)
LDL Cholesterol: 73 mg/dL (ref 0–99)
NonHDL: 87.45
Total CHOL/HDL Ratio: 2
Triglycerides: 72 mg/dL (ref 0.0–149.0)
VLDL: 14.4 mg/dL (ref 0.0–40.0)

## 2023-07-29 LAB — COMPREHENSIVE METABOLIC PANEL
ALT: 25 U/L (ref 0–35)
AST: 23 U/L (ref 0–37)
Albumin: 4.4 g/dL (ref 3.5–5.2)
Alkaline Phosphatase: 81 U/L (ref 39–117)
BUN: 10 mg/dL (ref 6–23)
CO2: 29 meq/L (ref 19–32)
Calcium: 9.6 mg/dL (ref 8.4–10.5)
Chloride: 101 meq/L (ref 96–112)
Creatinine, Ser: 0.55 mg/dL (ref 0.40–1.20)
GFR: 103.4 mL/min (ref >60.00)
Glucose, Bld: 113 mg/dL — ABNORMAL HIGH (ref 70–99)
Potassium: 4.2 meq/L (ref 3.5–5.1)
Sodium: 140 meq/L (ref 135–145)
Total Bilirubin: 0.3 mg/dL (ref 0.2–1.2)
Total Protein: 7.4 g/dL (ref 6.0–8.3)

## 2023-07-29 NOTE — Telephone Encounter (Signed)
 Medication Samples have been provided to the patient.  Drug name: Jardiance       Strength: 10mg         Qty: 4 boxes (month worth) LOT: [id number] Exp.Date: 05-01-25  Dosing instructions: TAKE 1 TABLET BY MOUTH DAILY    The patient has been instructed regarding the correct time, dose, and frequency of taking this medication, including desired effects and most common side effects.   Judy Garcia 2:58 PM 07/29/2023    SAMPLES HAVE BEEN LEFT UPFRONT IN DESIGNATED PICK UP AREA

## 2023-08-05 ENCOUNTER — Encounter: Payer: Self-pay | Admitting: Nurse Practitioner

## 2023-08-05 NOTE — Assessment & Plan Note (Signed)
 Diabetes is well controlled with Metformin twice daily, with an A1c of 6.3 in November. Discussed adding Jardiance for proteinuria, but blood sugars are stable and there have been episodes of hypoglycemia. Check urine for proteinuria today. If proteinuria persists, consider reducing Metformin and adding Jardiance. Encourage continued healthy diet and regular exercise.

## 2023-08-05 NOTE — Assessment & Plan Note (Addendum)
 Recent bereavement has led to increased anxiety and insomnia. Currently on Effexor XR. She recently started seeing a grief counselor which she has found helpful. Start Lorazepam 0.5 mg at bedtime and as needed during the day for anxiety. Counseled on common side effects. PDMP reviewed. Continue Effexor XR and follow up with counselor as scheduled. Plan for a 34-month follow-up to assess response to Lorazepam, sooner if needed.

## 2023-08-05 NOTE — Assessment & Plan Note (Signed)
 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 Previous surgical consult recommended OR removal, but declined. Refer for a second opinion regarding lipoma management.

## 2023-08-05 NOTE — Assessment & Plan Note (Signed)
 Hypertension is well controlled on Spironolactone 12.5 mg daily and Amlodipine 10 mg daily. Continue Spironolactone and Amlodipine.

## 2023-08-05 NOTE — Assessment & Plan Note (Signed)
 See grief plan. Continue Effexor XR. Starting on Lorazepam 0.5 mg at bedtime/as needed. Continue grief counseling.

## 2023-08-05 NOTE — Assessment & Plan Note (Signed)
 On Rosuvastatin with no new symptoms. Continue Rosuvastatin 40 mg daily. Check lipid panel today.

## 2023-08-10 ENCOUNTER — Other Ambulatory Visit: Payer: Self-pay | Admitting: Nurse Practitioner

## 2023-08-10 DIAGNOSIS — Z760 Encounter for issue of repeat prescription: Secondary | ICD-10-CM

## 2023-08-10 DIAGNOSIS — F32A Depression, unspecified: Secondary | ICD-10-CM

## 2023-08-10 NOTE — Telephone Encounter (Signed)
 Copied from CRM [id number]. Topic: Clinical - Medication Refill >> Aug 10, 2023  4:22 PM [place] J wrote: Most Recent Primary Care Visit:  Provider: Bethanie Dicker  Department: [facility]  Visit Type: TRANSFER OF CARE  Date: 07/28/2023  Medication: venlafaxine XR (EFFEXOR-XR) 75 MG  Has the patient contacted their pharmacy? Yes (Agent: If no, request that the patient contact the pharmacy for the refill. If patient does not wish to contact the pharmacy document the reason why and proceed with request.) (Agent: If yes, when and what did the pharmacy advise?)  Is this the correct pharmacy for this prescription? Yes If no, delete pharmacy and type the correct one.  This is the patient's preferred pharmacy:  [id number] - [place], [place] - [address] AT [facility] [address] [place] [address] [place] [address] Phone: [phone] Fax: [phone]  [id number], [place] - [address] AT [facility] OF [address] [place] [address] Phone: [phone] Fax: [phone]   Has the prescription been filled recently? No  Is the patient out of the medication? Yes  Has the patient been seen for an appointment in the last year OR does the patient have an upcoming appointment? Yes  Can we respond through MyChart? Yes  Agent: Please be advised that Rx refills may take up to 3 business days. We ask that you follow-up with your pharmacy.

## 2023-08-10 NOTE — Telephone Encounter (Signed)
 Last Fill: 04/20/23  Last OV: 07/28/23 Next OV: 08/25/23  Routing to provider for review/authorization.

## 2023-08-11 ENCOUNTER — Ambulatory Visit: Payer: Self-pay | Admitting: Nurse Practitioner

## 2023-08-11 MED ORDER — VENLAFAXINE HCL ER 75 MG PO CP24: ORAL_CAPSULE | ORAL | 3 refills | Status: AC

## 2023-08-11 NOTE — Telephone Encounter (Signed)
 Pt called back and explained that she really needs her medication because she has been out for 4 days and recently her husband passed away in front of her. Therefore, she is extremely worried of her emotional and mental state as the time is going on. I reassured her that the clinic team is actively working on her request and I would send another message. Pt ws emotional during our encounter today. She stated that she is willing to come in for on OV if she needs too. Please call and advise.

## 2023-08-11 NOTE — Telephone Encounter (Signed)
  Chief Complaint: anxiety  Symptoms: n/v, diarrhea, dizzy, headache, weakness, fatigue   Disposition: [] ED /[] Urgent Care (no appt availability in office) / [] Appointment(In office/virtual)/ []  [facility]/ [x] Home Care/ [] Refused Recommended Disposition /[] [facility]/ []  Follow-up with PCP Additional Notes: Pt calling for medication refill of Venlafaxine. Pt has been out 4 days. Pt feels her symptoms of nausea/vomiting, diarrhea, headache, weakness, fatigue, clammy are due to no medication and seeing her husband die from heart attack. Pt is sobbing on phone. Pt states the Lorazepam 0.5mg  is not helping but has been taking am/pm. Medication has been refilled.  Pt feels once medication started back up, symptoms will subside. Medication was called in at 1622. RN has tried to call pt back 4 x to notify her, but goes straight to voicemail.       Copied from CRM [id number]. Topic: Clinical - Red Word Triage >> Aug 11, 2023  4:09 PM Shelbie Proctor wrote: Red Word that prompted transfer to Nurse Triage: Patient [phone] is calling for refill venlafaxine XR (EFFEXOR-XR) 75 MG 24 hr capsule and has been out of medication since last Friday. Patient is asking what is needed to have a refill, last seen 07/28/23 and has an upcoming appointment. Patient is having side effect from not taking medication, symptoms are tremors, funny feeling in the head, vomiting, diarrhea, crying spells, dizziness, fatigue, weakness, sweaty, clammy, and headaches. Patient has not been to work for the 2 days. [facility] pharmacy cannot give patient an emergency pills because patient has a new provider NP, Konrad Dolores. Patient's husband recently passed away in front of her.   [facility][id number] [place], [place] - [address] AT [facility] [address] [place] [address] Phone: [phone] Fax: [phone] Reason for Disposition  MILD anxiety symptoms (e.g., Anxiety symptoms are mild and intermittent;  symptoms do not interfere with daily activities)  Answer Assessment - Initial Assessment Questions 1. CONCERN: "Did anything happen that prompted you to call today?"      Been off Effexor XR for several days/death of husband  2. ANXIETY SYMPTOMS: "Can you describe how you (your loved one; patient) have been feeling?" (e.g., tense, restless, panicky, anxious, keyed up, overwhelmed, sense of impending doom).      Overwhelmed  3. ONSET: "How long have you been feeling this way?" (e.g., hours, days, weeks)     Several days  4. SEVERITY: "How would you rate the level of anxiety?" (e.g., 0 - 10; or mild, moderate, severe).     na 5. FUNCTIONAL IMPAIRMENT: "How have these feelings affected your ability to do daily activities?" "Have you had more difficulty than usual doing your normal daily activities?" (e.g., getting better, same, worse; self-care, school, work, interactions) Yes  12. OTHER SYMPTOMS: "Do you have any other symptoms?" (e.g., feeling depressed, trouble concentrating, trouble sleeping, trouble breathing, palpitations or fast heartbeat, chest pain, sweating, nausea, or diarrhea) "      Diarrhea, vomiting, crying spells, dizziness, fatigue, weakness  Protocols used: Anxiety and Panic Attack-A-AH

## 2023-08-11 NOTE — Telephone Encounter (Signed)
 Noted.

## 2023-08-11 NOTE — Telephone Encounter (Signed)
 Pt has been informed.

## 2023-08-11 NOTE — Telephone Encounter (Signed)
 Called and informed pt that refill has been sent.

## 2023-08-11 NOTE — Telephone Encounter (Signed)
 Pt called asking if this med has been filled. Pt was told by pharmacy that provider denied refill, pt asked why.

## 2023-08-19 ENCOUNTER — Telehealth: Payer: Self-pay

## 2023-08-19 NOTE — Telephone Encounter (Signed)
 Called pt but VM box is full unable to leave a vm in regards to her general surgery referral they have been trying to get in contact with her their number is [phone], I have also sent pt a mychart msg    Referral placed to [facility] at [place].     [facility] it is okay to relay that info to pt when calls back please send a message back once completed. )

## 2023-08-20 NOTE — Telephone Encounter (Signed)
 2nd attempt to get in contact with pt vm box is still full unable to leave a vm    Called pts son Mr. Judy Garcia who is on pts DPR and informed him and he stated he will get in contact with his mother and inform her the phone number has been provided to him for her to call [facility] to get scheduled

## 2023-08-25 ENCOUNTER — Encounter: Payer: Self-pay | Admitting: Pharmacist

## 2023-08-25 ENCOUNTER — Telehealth (INDEPENDENT_AMBULATORY_CARE_PROVIDER_SITE_OTHER): Payer: Self-pay | Admitting: Nurse Practitioner

## 2023-08-25 ENCOUNTER — Encounter: Payer: Self-pay | Admitting: Nurse Practitioner

## 2023-08-25 VITALS — Ht 61.0 in | Wt 230.0 lb

## 2023-08-25 DIAGNOSIS — F4321 Adjustment disorder with depressed mood: Secondary | ICD-10-CM

## 2023-08-25 DIAGNOSIS — Z7984 Long term (current) use of oral hypoglycemic drugs: Secondary | ICD-10-CM

## 2023-08-25 DIAGNOSIS — F419 Anxiety disorder, unspecified: Secondary | ICD-10-CM

## 2023-08-25 DIAGNOSIS — F32A Depression, unspecified: Secondary | ICD-10-CM

## 2023-08-25 DIAGNOSIS — G479 Sleep disorder, unspecified: Secondary | ICD-10-CM

## 2023-08-25 DIAGNOSIS — E119 Type 2 diabetes mellitus without complications: Secondary | ICD-10-CM

## 2023-08-25 NOTE — Assessment & Plan Note (Signed)
 Diabetes is well-controlled with an A1c of 6.1. London Pepper is used for kidney protection and blood sugar management. She is informed about the yeast infection risk due to increased urinary sugar. Metformin has been reduced to prevent hypoglycemia. Continue metformin once daily and Jardiance as prescribed. Monitor for symptoms of yeast infections and report if she occurs. We will continue to monitor and recheck A1c and urine ACR in 2 months.

## 2023-08-25 NOTE — Progress Notes (Addendum)
 Brief Telephone Documentation Reason for Call: Patient Assistance Screening  Summary of Call: Have been unable to reach patient via phone. Unable to leave message (voicemail full),  PCP would like to explore SGLT2i patient assistance if patient qualifies.   MyChart Message sent to patient with direct callback number listed.   Needed for PAP application completion: Letter/statement of job loss Income    MyChart message confirmed as read by patient 08/26/23.  No response from patient via phone or mychart as of 09/08/23.   Loree Fee, PharmD Clinical Pharmacist [facility] [phone]

## 2023-08-25 NOTE — Assessment & Plan Note (Signed)
 Sleep difficulty is exacerbated by grief and an irregular work schedule. Trazodone caused grogginess, but sleep hygiene has been helpful. She plans to take melatonin earlier for sleep onset. Encourage continuation of sleep hygiene practices and consider taking melatonin earlier in the evening if needed.

## 2023-08-25 NOTE — Assessment & Plan Note (Signed)
 Increased anxiety, depression and sleep disturbances are likely related to grief. Effexor effectively manages her mood, and lorazepam is effective for anxiety but not for sleep. Continue Effexor as prescribed and lorazepam as needed for anxiety. Encourage continuation of weekly grief counseling. We will continue to monitor.

## 2023-08-25 NOTE — Assessment & Plan Note (Signed)
 See grief plan. Continue Effexor XR. Starting on Lorazepam 0.5 mg at bedtime/as needed. Continue grief counseling.

## 2023-08-25 NOTE — Progress Notes (Signed)
 MyChart Video Visit    Virtual Visit via Video Note   This visit type was conducted because this format is felt to be most appropriate for this patient at this time. Physical exam was limited by quality of the video and audio technology used for the visit. CMA was able to get the patient set up on a video visit.  Patient location: Home. Patient and provider in visit Provider location: Office  I discussed the limitations of evaluation and management by telemedicine and the availability of in person appointments. The patient expressed understanding and agreed to proceed.  Visit Date: 08/25/2023  Today's healthcare provider: Bethanie Dicker, NP     Subjective:    Patient ID: Judy Garcia, female    DOB: 23-Oct-1970, 53 y.o.   MRN: [id number]  Chief Complaint  Patient presents with   Medical Management of Chronic Issues    4 wk follow  Still not sleeping much still doing the grief counseling     HPI  Discussed the use of AI scribe software for clinical note transcription with the patient, who gave verbal consent to proceed.  History of Present Illness   Judy Garcia is a 53 year old female who presents for follow up on mood management.  She is experiencing ongoing mood disturbances, likely related to grief, and continues to attend grief counseling sessions once a week. She is currently taking Effexor and feels it is effective. A month ago, she was started on Ativan (lorazepam) to be taken as needed and at bedtime, which she finds helpful for managing emotional days and anxiety, though it does not aid her sleep.  Her work schedule includes second and third shifts, affecting her sleep pattern, often resulting in her going to bed around 5 AM and sleeping during the day. Sleep remains challenging, and she has tried melatonin gummies in the past but stopped when she started lorazepam. She has been experimenting with sleep hygiene practices, such as turning off the TV, which she finds  beneficial for better sleep.  Regarding diabetes management, she is on a regimen of one metformin and one Jardiance daily. Her last A1c was 6.1, indicating good control. She is aware of the potential side effect of increased risk for yeast infections due to sugar excretion in urine.  She is in recovery and often discusses her experiences with her sponsor, who has been supportive, especially given her similar experiences with loss.      Past Medical History:  Diagnosis Date   Alcoholism and drug addiction in family    Allergy    Back pain 08/09/2019   Chicken pox    COVID-19    11/2020   Depression    Diabetes mellitus without complication (HCC)    Eating disorder    GERD (gastroesophageal reflux disease)    History of lipoma 08/09/2019   Hypertension    Sleep disturbance 08/11/2019   Ulcer (traumatic) of oral mucosa    UTI (urinary tract infection)     Past Surgical History:  Procedure Laterality Date   HERNIA REPAIR     TONSILLECTOMY     TUBAL LIGATION     UTERINE FIBROID SURGERY      Family History  Problem Relation Age of Onset   Diabetes Mother    Cancer Maternal Grandmother     Social History   Socioeconomic History   Marital status: Married    Spouse name: Not on file   Number of children: Not on file  Years of education: Not on file   Highest education level: Not on file  Occupational History   Not on file  Tobacco Use   Smoking status: Every Day    Current packs/day: 0.50    Average packs/day: 0.5 packs/day for 25.0 years (12.5 ttl pk-yrs)    Types: Cigarettes   Smokeless tobacco: Never  Vaping Use   Vaping status: Never Used  Substance and Sexual Activity   Alcohol use: Never   Drug use: Never   Sexual activity: Yes    Birth control/protection: None  Other Topics Concern   Not on file  Social History Narrative   Not on file   Social Drivers of Health   Financial Resource Strain: Not on file  Food Insecurity: Not on file  Transportation  Needs: Not on file  Physical Activity: Not on file  Stress: Not on file  Social Connections: Not on file  Intimate Partner Violence: Not on file    Outpatient Medications Prior to Visit  Medication Sig Dispense Refill   amLODipine (NORVASC) 10 MG tablet TAKE 1 TABLET(10 MG) BY MOUTH DAILY 90 tablet 1   azelastine (ASTELIN) 0.1 % nasal spray Place 1 spray into both nostrils 2 (two) times daily. 30 mL 5   docusate sodium (COLACE) 100 MG capsule Take 1 capsule (100 mg total) by mouth daily. 90 capsule 1   fluticasone furoate-vilanterol (BREO ELLIPTA) 100-25 MCG/ACT AEPB Inhale 1 puff into the lungs daily. 1 each 11   LORazepam (ATIVAN) 0.5 MG tablet Take 1 tablet (0.5 mg total) by mouth every 8 (eight) hours as needed for anxiety or sleep. 30 tablet 2   metFORMIN (GLUCOPHAGE-XR) 500 MG 24 hr tablet TAKE 1 TABLET(500 MG) BY MOUTH TWICE DAILY WITH A MEAL 60 tablet 3   rosuvastatin (CRESTOR) 40 MG tablet Take 1 tablet (40 mg total) by mouth daily. 90 tablet 3   spironolactone (ALDACTONE) 25 MG tablet TAKE 1/2 TABLET(12.5 MG) BY MOUTH DAILY 45 tablet 0   venlafaxine XR (EFFEXOR-XR) 75 MG 24 hr capsule TAKE 3 CAPSULES BY MOUTH DAILY WITH BREAKFAST 270 capsule 3   Accu-Chek Softclix Lancets lancets Use as instructed 100 each 12   albuterol (VENTOLIN HFA) 108 (90 Base) MCG/ACT inhaler INHALE 1 TO 2 PUFFS INTO THE LUNGS EVERY 6 HOURS AS NEEDED FOR WHEEZING OR SHORTNESS OF BREATH 54 g 0   Ascorbic Acid (VITAMIN C) 100 MG tablet Take 100 mg by mouth as needed.     blood glucose meter kit and supplies Dispense based on patient and insurance preference. Use up to four times daily as directed. (FOR ICD-10 E10.9, E11.9). 1 each 0   empagliflozin (JARDIANCE) 10 MG TABS tablet Take 1 tablet (10 mg total) by mouth daily before breakfast. 30 tablet 2   glucose blood (ACCU-CHEK GUIDE) test strip Use as instructed 100 each 12   Multiple Vitamin (MULTIVITAMIN) tablet Take 1 tablet by mouth daily.     ondansetron  (ZOFRAN) 4 MG tablet Take 1 tablet (4 mg total) by mouth every 8 (eight) hours as needed for nausea or vomiting. (Patient not taking: Reported on 08/25/2023) 20 tablet 0   PROAIR RESPICLICK 108 (90 Base) MCG/ACT AEPB INHALE 2 PUFFS INTO THE LUNGS FOUR TIMES DAILY AS NEEDED 1 each 0   traZODone (DESYREL) 50 MG tablet Take 0.5-1 tablets (25-50 mg total) by mouth at bedtime as needed for sleep. (Patient not taking: Reported on 08/25/2023) 30 tablet 0   No facility-administered medications prior to visit.  Allergies  Allergen Reactions   Lisinopril Swelling    Angioedema. ng    ROS See HPI    Objective:    Physical Exam  Ht 5\' 1"  (1.549 m)   Wt 230 lb (104.3 kg)   BMI 43.46 kg/m  Wt Readings from Last 3 Encounters:  08/25/23 230 lb (104.3 kg)  07/28/23 230 lb (104.3 kg)  07/15/23 231 lb (104.8 kg)   GENERAL: alert, oriented, appears well and in no acute distress   HEENT: atraumatic, conjunttiva clear, no obvious abnormalities on inspection of external nose and ears   NECK: normal movements of the head and neck   LUNGS: on inspection no signs of respiratory distress, breathing rate appears normal, no obvious gross SOB, gasping or wheezing   CV: no obvious cyanosis   MS: moves all visible extremities without noticeable abnormality   PSYCH/NEURO: pleasant and cooperative, no obvious depression or anxiety, speech and thought processing grossly intact    Assessment & Plan:   Problem List Items Addressed This Visit     Sleep disturbance   Sleep difficulty is exacerbated by grief and an irregular work schedule. Trazodone caused grogginess, but sleep hygiene has been helpful. She plans to take melatonin earlier for sleep onset. Encourage continuation of sleep hygiene practices and consider taking melatonin earlier in the evening if needed.      Anxiety and depression (Chronic)   See grief plan. Continue Effexor XR. Starting on Lorazepam 0.5 mg at bedtime/as needed. Continue  grief counseling.       Type 2 diabetes mellitus without complication, without long-term current use of insulin (HCC) - Primary (Chronic)   Diabetes is well-controlled with an A1c of 6.1. London Pepper is used for kidney protection and blood sugar management. She is informed about the yeast infection risk due to increased urinary sugar. Metformin has been reduced to prevent hypoglycemia. Continue metformin once daily and Jardiance as prescribed. Monitor for symptoms of yeast infections and report if she occurs. We will continue to monitor and recheck A1c and urine ACR in 2 months.       Grief reaction   Increased anxiety, depression and sleep disturbances are likely related to grief. Effexor effectively manages her mood, and lorazepam is effective for anxiety but not for sleep. Continue Effexor as prescribed and lorazepam as needed for anxiety. Encourage continuation of weekly grief counseling. We will continue to monitor.        I am having Judy Garcia maintain her multivitamin, vitamin C, docusate sodium, ProAir RespiClick, blood glucose meter kit and supplies, albuterol, Accu-Chek Softclix Lancets, Accu-Chek Guide, fluticasone furoate-vilanterol, amLODipine, empagliflozin, rosuvastatin, metFORMIN, spironolactone, traZODone, azelastine, ondansetron, LORazepam, and venlafaxine XR.  No orders of the defined types were placed in this encounter.  I discussed the assessment and treatment plan with the patient. The patient was provided an opportunity to ask questions and all were answered. The patient agreed with the plan and demonstrated an understanding of the instructions.   The patient was advised to call back or seek an in-person evaluation if the symptoms worsen or if the condition fails to improve as anticipated.   Bethanie Dicker, NP [facility] at [facility] [phone] (phone) [phone] (fax)  [facility]

## 2023-08-26 ENCOUNTER — Other Ambulatory Visit: Payer: Self-pay | Admitting: Nurse Practitioner

## 2023-08-26 ENCOUNTER — Telehealth: Payer: Self-pay

## 2023-08-26 DIAGNOSIS — E119 Type 2 diabetes mellitus without complications: Secondary | ICD-10-CM

## 2023-08-26 DIAGNOSIS — E7800 Pure hypercholesterolemia, unspecified: Secondary | ICD-10-CM

## 2023-08-26 DIAGNOSIS — J40 Bronchitis, not specified as acute or chronic: Secondary | ICD-10-CM

## 2023-08-26 DIAGNOSIS — I10 Essential (primary) hypertension: Secondary | ICD-10-CM

## 2023-08-26 MED ORDER — FLUTICASONE FUROATE-VILANTEROL 100-25 MCG/ACT IN AEPB: 1.0000 | INHALATION_SPRAY | Freq: Every day | RESPIRATORY_TRACT | 11 refills | Status: AC

## 2023-08-26 MED ORDER — ROSUVASTATIN CALCIUM 40 MG PO TABS: 40.0000 mg | ORAL_TABLET | Freq: Every day | ORAL | 3 refills | Status: DC

## 2023-08-26 MED ORDER — AMLODIPINE BESYLATE 10 MG PO TABS
10.0000 mg | ORAL_TABLET | Freq: Every day | ORAL | 3 refills | Status: AC
Start: 2023-08-26 — End: ?

## 2023-08-26 MED ORDER — EMPAGLIFLOZIN 10 MG PO TABS: 10.0000 mg | ORAL_TABLET | Freq: Every day | ORAL | 2 refills | Status: DC

## 2023-08-26 MED ORDER — METFORMIN HCL ER 500 MG PO TB24: ORAL_TABLET | ORAL | 3 refills | Status: AC

## 2023-08-26 MED ORDER — SPIRONOLACTONE 25 MG PO TABS: 12.5000 mg | ORAL_TABLET | Freq: Every day | ORAL | 0 refills | Status: DC

## 2023-08-26 NOTE — Telephone Encounter (Unsigned)
 Copied from CRM [id number]. Topic: Clinical - Medication Refill >> Aug 26, 2023  2:40 PM Adaysia C wrote: Most Recent Primary Care Visit:  Provider: Bethanie Dicker  Department: [facility]  Visit Type: OFFICE VISIT  Date: 08/25/2023  Medication: amLODipine (NORVASC) 10 MG tablet  Has the patient contacted their pharmacy? Yes, patients pharmacy said they sent in an RX refill but it was not received by the clinic (Agent: If no, request that the patient contact the pharmacy for the refill. If patient does not wish to contact the pharmacy document the reason why and proceed with request.) (Agent: If yes, when and what did the pharmacy advise?)  Is this the correct pharmacy for this prescription? Yes If no, delete pharmacy and type the correct one.  This is the patient's preferred pharmacy:  [facility] #[id number] - [place], [place] - [address] AT [facility] OF [address] [place] [address] [place] [address] Phone: [phone] Fax: [phone]   Has the prescription been filled recently? No  Is the patient out of the medication? Yes, patient has been with out the medication for 3 days  Has the patient been seen for an appointment in the last year OR does the patient have an upcoming appointment? Yes  Can we respond through MyChart? Yes  Agent: Please be advised that Rx refills may take up to 3 business days. We ask that you follow-up with your pharmacy.

## 2023-08-26 NOTE — Telephone Encounter (Signed)
 Copied from CRM [id number]. Topic: Clinical - Prescription Issue >> Aug 26, 2023  2:44 PM Adaysia C wrote: Reason for CRM: All the patients medications need to be updated to the new authorizing PCP on file(Kacy Konrad Dolores NP)

## 2023-08-26 NOTE — Addendum Note (Signed)
 Addended by: Donavan Foil on: 08/26/2023 03:30 PM   Modules accepted: Orders

## 2023-10-19 ENCOUNTER — Other Ambulatory Visit: Payer: Self-pay | Admitting: Nurse Practitioner

## 2023-10-19 DIAGNOSIS — F32A Depression, unspecified: Secondary | ICD-10-CM

## 2023-10-19 DIAGNOSIS — F4320 Adjustment disorder, unspecified: Secondary | ICD-10-CM

## 2023-10-19 NOTE — Telephone Encounter (Signed)
 LOV 08/25/2023  NOV NA  No Contract

## 2023-11-06 ENCOUNTER — Other Ambulatory Visit: Payer: Self-pay

## 2023-11-06 ENCOUNTER — Encounter: Payer: Self-pay | Admitting: Nurse Practitioner

## 2023-11-06 ENCOUNTER — Ambulatory Visit: Payer: Self-pay

## 2023-11-06 ENCOUNTER — Emergency Department
Admission: EM | Admit: 2023-11-06 | Discharge: 2023-11-06 | Disposition: A | Discharge status: Home / Self Care | Payer: Self-pay | Attending: Emergency Medicine | Admitting: Emergency Medicine

## 2023-11-06 ENCOUNTER — Encounter: Payer: Self-pay | Admitting: Emergency Medicine

## 2023-11-06 ENCOUNTER — Telehealth: Payer: Self-pay | Admitting: Physician Assistant

## 2023-11-06 DIAGNOSIS — N390 Urinary tract infection, site not specified: Secondary | ICD-10-CM | POA: Insufficient documentation

## 2023-11-06 DIAGNOSIS — E119 Type 2 diabetes mellitus without complications: Secondary | ICD-10-CM

## 2023-11-06 DIAGNOSIS — Z79899 Other long term (current) drug therapy: Secondary | ICD-10-CM | POA: Insufficient documentation

## 2023-11-06 DIAGNOSIS — I10 Essential (primary) hypertension: Secondary | ICD-10-CM

## 2023-11-06 DIAGNOSIS — R310 Gross hematuria: Secondary | ICD-10-CM

## 2023-11-06 LAB — URINALYSIS, ROUTINE W REFLEX MICROSCOPIC
Bilirubin Urine: NEGATIVE
Glucose, UA: NEGATIVE mg/dL
Ketones, ur: NEGATIVE mg/dL
Nitrite: NEGATIVE
Protein, ur: 100 mg/dL — AB
RBC / HPF: >50 RBC/hpf (ref 0–5)
Specific Gravity, Urine: 1.013 (ref 1.005–1.030)
WBC, UA: >50 WBC/hpf (ref 0–5)
pH: 6 (ref 5.0–8.0)

## 2023-11-06 LAB — WET PREP, GENITAL
Clue Cells Wet Prep HPF POC: NONE SEEN
Sperm: NONE SEEN
Trich, Wet Prep: NONE SEEN
WBC, Wet Prep HPF POC: <10 (ref <10)
Yeast Wet Prep HPF POC: NONE SEEN

## 2023-11-06 MED ORDER — FLUCONAZOLE 150 MG PO TABS: ORAL_TABLET | ORAL | 0 refills | Status: DC

## 2023-11-06 MED ORDER — CEPHALEXIN 500 MG PO CAPS
500.0000 mg | ORAL_CAPSULE | Freq: Once | ORAL | Status: AC
  Administered 2023-11-06: 500 mg via ORAL
  Filled 2023-11-06: qty 1

## 2023-11-06 MED ORDER — CEPHALEXIN 500 MG PO CAPS
500.0000 mg | ORAL_CAPSULE | Freq: Three times a day (TID) | ORAL | 0 refills | Status: AC
Start: 2023-11-06 — End: 2023-11-13

## 2023-11-06 NOTE — ED Triage Notes (Addendum)
 To ambulatory to ER with complaint of urinary discomfort, frequency, and bright red blood after urinating for the last 3 days. Denies any flank or abdominal pain.  Been taking lisinopril and crestor  for past 2 months due to diabetes and told could cause yeast infections and/or UTI.

## 2023-11-06 NOTE — Patient Instructions (Signed)
 Judy Garcia, thank you for joining [name], PA-C for today's virtual visit.  While this provider is not your primary care provider (PCP), if your PCP is located in our provider database this encounter information will be shared with them immediately following your visit.   A [facility] MyChart account gives you access to today's visit and all your visits, tests, and labs performed at [facility] " click here if you don't have a [facility] MyChart account or go to [email or web address]  Consent: (Patient) Judy Garcia provided verbal consent for this virtual visit at the beginning of the encounter.  Current Medications:  Current Outpatient Medications:    Accu-Chek Softclix Lancets lancets, Use as instructed, Disp: 100 each, Rfl: 12   albuterol  (VENTOLIN  HFA) 108 (90 Base) MCG/ACT inhaler, INHALE 1 TO 2 PUFFS INTO THE LUNGS EVERY 6 HOURS AS NEEDED FOR WHEEZING OR SHORTNESS OF BREATH, Disp: 54 g, Rfl: 0   amLODipine  (NORVASC ) 10 MG tablet, Take 1 tablet (10 mg total) by mouth daily., Disp: 90 tablet, Rfl: 3   Ascorbic Acid (VITAMIN C) 100 MG tablet, Take 100 mg by mouth as needed., Disp: , Rfl:    azelastine  (ASTELIN ) 0.1 % nasal spray, Place 1 spray into both nostrils 2 (two) times daily., Disp: 30 mL, Rfl: 5   blood glucose meter kit and supplies, Dispense based on patient and insurance preference. Use up to four times daily as directed. (FOR ICD-10 E10.9, E11.9)., Disp: 1 each, Rfl: 0   docusate sodium  (COLACE) 100 MG capsule, Take 1 capsule (100 mg total) by mouth daily., Disp: 90 capsule, Rfl: 1   empagliflozin  (JARDIANCE ) 10 MG TABS tablet, Take 1 tablet (10 mg total) by mouth daily before breakfast., Disp: 30 tablet, Rfl: 2   fluticasone  furoate-vilanterol (BREO ELLIPTA ) 100-25 MCG/ACT AEPB, Inhale 1 puff into the lungs daily., Disp: 1 each, Rfl: 11   glucose blood (ACCU-CHEK GUIDE) test strip, Use as instructed, Disp: 100 each, Rfl: 12   LORazepam  (ATIVAN ) 0.5  MG tablet, TAKE 1 TABLET(0.5 MG) BY MOUTH EVERY 8 HOURS AS NEEDED FOR ANXIETY OR SLEEP, Disp: 30 tablet, Rfl: 0   metFORMIN  (GLUCOPHAGE -XR) 500 MG 24 hr tablet, TAKE 1 TABLET(500 MG) BY MOUTH TWICE DAILY WITH A MEAL, Disp: 180 tablet, Rfl: 3   Multiple Vitamin (MULTIVITAMIN) tablet, Take 1 tablet by mouth daily., Disp: , Rfl:    ondansetron  (ZOFRAN ) 4 MG tablet, Take 1 tablet (4 mg total) by mouth every 8 (eight) hours as needed for nausea or vomiting. (Patient not taking: Reported on 08/25/2023), Disp: 20 tablet, Rfl: 0   PROAIR  RESPICLICK 108 (90 Base) MCG/ACT AEPB, INHALE 2 PUFFS INTO THE LUNGS FOUR TIMES DAILY AS NEEDED, Disp: 1 each, Rfl: 0   rosuvastatin  (CRESTOR ) 40 MG tablet, Take 1 tablet (40 mg total) by mouth daily., Disp: 90 tablet, Rfl: 3   spironolactone  (ALDACTONE ) 25 MG tablet, Take 0.5 tablets (12.5 mg total) by mouth daily., Disp: 45 tablet, Rfl: 0   traZODone  (DESYREL ) 50 MG tablet, Take 0.5-1 tablets (25-50 mg total) by mouth at bedtime as needed for sleep. (Patient not taking: Reported on 08/25/2023), Disp: 30 tablet, Rfl: 0   venlafaxine  XR (EFFEXOR -XR) 75 MG 24 hr capsule, TAKE 3 CAPSULES BY MOUTH DAILY WITH BREAKFAST, Disp: 270 capsule, Rfl: 3   Medications ordered in this encounter:  No orders of the defined types were placed in this encounter.    *If you need refills on other medications prior to your next appointment, please  contact your pharmacy*  Follow-Up: Call back or seek an in-person evaluation if the symptoms worsen or if the condition fails to improve as anticipated.  [facility] [phone]  Other Instructions Report to ER for Evaluation.    If you have been instructed to have an in-person evaluation today at a local Urgent Care facility, please use the link below. It will take you to a list of all of our available [facility], including address, phone number and hours of operation. Please do not delay care.  [facility]  If you or a family member do not have a primary care provider, use the link below to schedule a visit and establish care. When you choose a [facility] primary care physician or advanced practice provider, you gain a long-term partner in health. Find a Primary Care Provider  Learn more about [facility]'s in-office and virtual care options: [facility] - Get Care Now

## 2023-11-06 NOTE — Telephone Encounter (Signed)
 FYI Only or Action Required?: FYI only for provider  Patient was last seen in primary care on 08/25/2023 by Bluford Burkitt, NP. Called Nurse Triage reporting Hematuria. Symptoms began several days ago. Interventions attempted: Nothing. Symptoms are: rapidly worsening.  Triage Disposition: See Physician Within 24 Hours  Patient/caregiver understands and will follow disposition?: Yes  Copied from CRM [id number]. Topic: Clinical - Red Word Triage >> Nov 06, 2023  1:53 PM Adonis Hoot wrote: Red Word that prompted transfer to Nurse Triage: urinating blood Reason for Disposition  Urinating more frequently than usual (i.e., frequency)  Answer Assessment - Initial Assessment Questions 1. SYMPTOM: "What's the main symptom you're concerned about?" (e.g., frequency, incontinence)     Frequent urination, itching, burning, hematuria 2. ONSET: "When did the  symptoms  start?"     Three days ago 3. PAIN: "Is there any pain?" If Yes, ask: "How bad is it?" (Scale: 1-10; mild, moderate, severe)     9/10 with urination 4. CAUSE: "What do you think is causing the symptoms?"     UTI secondary to new medication 5. OTHER SYMPTOMS: "Do you have any other symptoms?" (e.g., blood in urine, fever, flank pain, pain with urination)  Protocols used: Urinary Symptoms-A-AH

## 2023-11-06 NOTE — ED Provider Notes (Signed)
 [facility] Provider Note    Event Date/Time   First MD Initiated Contact with Patient 11/06/23 2054     (approximate)   History   Urinary Frequency   HPI  Judy Garcia is a 53 y.o. female with history of hypertension, UTIs, and other insignificant past medical history presents emergency department with dysuria, urinary frequency, and some vaginal discharge.  Patient is not sexually active.  Symptoms have been ongoing for 3 days.  Denies fever or chills.      Physical Exam   Triage Vital Signs: ED Triage Vitals  Encounter Vitals Group     BP 11/06/23 2036 (!) 145/85     Systolic BP Percentile --      Diastolic BP Percentile --      Pulse Rate 11/06/23 2036 80     Resp 11/06/23 2036 17     Temp 11/06/23 2036 98.4 F (36.9 C)     Temp Source 11/06/23 2036 Oral     SpO2 11/06/23 2036 97 %     Weight 11/06/23 2035 225 lb (102.1 kg)     Height 11/06/23 2035 5\' 1"  (1.549 m)     Head Circumference --      Peak Flow --      Pain Score 11/06/23 2037 8     Pain Loc --      Pain Education --      Exclude from Growth Chart --     Most recent vital signs: Vitals:   11/06/23 2036  BP: (!) 145/85  Pulse: 80  Resp: 17  Temp: 98.4 F (36.9 C)  SpO2: 97%     General: Awake, no distress.   CV:  Good peripheral perfusion.  Resp:  Normal effort.  Abd:  No distention.   Other:      ED Results / Procedures / Treatments   Labs (all labs ordered are listed, but only abnormal results are displayed) Labs Reviewed  URINALYSIS, ROUTINE W REFLEX MICROSCOPIC - Abnormal; Notable for the following components:      Result Value   Color, Urine YELLOW (*)    APPearance CLOUDY (*)    Hgb urine dipstick LARGE (*)    Protein, ur 100 (*)    Leukocytes,Ua LARGE (*)    Bacteria, UA RARE (*)    All other components within normal limits  WET PREP, GENITAL  URINE CULTURE     EKG     RADIOLOGY     PROCEDURES:   Procedures  Critical Care:   no Chief Complaint  Patient presents with   Urinary Frequency      MEDICATIONS ORDERED IN ED: Medications  cephALEXin (KEFLEX) capsule 500 mg (500 mg Oral Given 11/06/23 2229)     IMPRESSION / MDM / ASSESSMENT AND PLAN / ED COURSE  I reviewed the triage vital signs and the nursing notes.                              Differential diagnosis includes, but is not limited to, UTI, dysuria, urinary frequency, bacterial vaginosis, yeast  Patient's presentation is most consistent with acute illness / injury with system symptoms.   UA, wet prep ordered  Wet prep reassuring  UA shows large amount leuks, white blood cell clumps, rare bacteria  Added urine culture  I did explain findings to patient.  Diagnosed her with UTI.  She was given Keflex 500 mg p.o. while here  in the ED.  Will treat her with Keflex 500 3 times daily for 7 days.  If the urine culture grows out bacteria that is resistant to Keflex someone will call her and change the antibiotic.  Patient is in agreement treatment plan.  Strict instructions to return the emergency department if worsening.  Discharged in stable condition.     FINAL CLINICAL IMPRESSION(S) / ED DIAGNOSES   Final diagnoses:  Acute UTI     Rx / DC Orders   ED Discharge Orders          Ordered    cephALEXin (KEFLEX) 500 MG capsule  3 times daily        11/06/23 2224    fluconazole  (DIFLUCAN ) 150 MG tablet        11/06/23 2224             Note:  This document was prepared using Dragon voice recognition software and may include unintentional dictation errors.    Delsie Figures, PA-C 11/06/23 2230    Lind Repine, MD 11/06/23 [phone]

## 2023-11-06 NOTE — Discharge Instructions (Signed)
 Follow-up with your regular doctor if not improving in 3 days.  Return emergency department if worsening.  Take the antibiotic as prescribed.  If your urine culture shows that you are not on the correct antibiotic someone will call you and place you on the correct antibiotic.  If you begin to vomit, have worsening symptoms please return emergency department

## 2023-11-06 NOTE — Progress Notes (Signed)
 Virtual Visit Consent   [name], you are scheduled for a virtual visit with a [facility] provider today. Just as with appointments in the office, your consent must be obtained to participate. Your consent will be active for this visit and any virtual visit you may have with one of our providers in the next 365 days. If you have a MyChart account, a copy of this consent can be sent to you electronically.  As this is a virtual visit, video technology does not allow for your provider to perform a traditional examination. This may limit your provider's ability to fully assess your condition. If your provider identifies any concerns that need to be evaluated in person or the need to arrange testing (such as labs, EKG, etc.), we will make arrangements to do so. Although advances in technology are sophisticated, we cannot ensure that it will always work on either your end or our end. If the connection with a video visit is poor, the visit may have to be switched to a telephone visit. With either a video or telephone visit, we are not always able to ensure that we have a secure connection.  By engaging in this virtual visit, you consent to the provision of healthcare and authorize for your insurance to be billed (if applicable) for the services provided during this visit. Depending on your insurance coverage, you may receive a charge related to this service.  I need to obtain your verbal consent now. Are you willing to proceed with your visit today? [name] has provided verbal consent on 11/06/2023 for a virtual visit (video or telephone). [name], [place]  Date: 11/06/2023 3:00 PM   Virtual Visit via Video Note   I, [name], connected with  Judy Garcia  ([id number], Mar 25, 1971) on 11/06/23 at  2:45 PM EDT by a video-enabled telemedicine application and verified that I am speaking with the correct person using two identifiers.  Location: Patient: Virtual Visit Location Patient:  Home Provider: Virtual Visit Location Provider: Home Office   I discussed the limitations of evaluation and management by telemedicine and the availability of in person appointments. The patient expressed understanding and agreed to proceed.    History of Present Illness: Judy Garcia is a 53 y.o. who identifies as a female who was assigned female at birth, and is being seen today for hematuria.  HPI: Hematuria This is a new problem. The current episode started yesterday. She describes the hematuria as gross hematuria. The hematuria occurs during the initial portion of her urinary stream. She reports no clotting in her urine stream. She is experiencing no pain. She describes her urine color as dark red. Irritative symptoms include frequency. Associated symptoms include bladder pain and urinary retention. She is not sexually active. Her past medical history is significant for hypertension.    Problems:  Patient Active Problem List   Diagnosis Date Noted   Grief reaction 07/28/2023   HLD (hyperlipidemia) 05/04/2023   Increased pressure in the eye, bilateral 04/27/2023   Bronchitis 04/21/2022   Left knee pain 01/15/2022   Foot callus 01/15/2022   Type 2 diabetes mellitus without complication, without long-term current use of insulin (HCC) 12/17/2021   Hip pain, right 04/15/2021   Acute pain of right knee 04/15/2021   Leg swelling 04/15/2021   Fall 04/15/2021   Atypical mole 04/15/2021   Soft tissue lesion 01/29/2021   Epistaxis 01/29/2021   Ear fullness 01/29/2021   Abnormal partial thromboplastin time (PTT) 01/29/2021   Chronic bronchitis (  HCC) 01/02/2021   Anxiety and depression 01/02/2021   Sleep disturbance 08/11/2019   Back pain 08/09/2019   History of lipoma 08/09/2019   Lipoma of face 07/28/2018   Onychomycosis 07/28/2018   Essential hypertension 07/28/2018   History of alcohol abuse 07/28/2018   Menopausal symptoms 07/28/2018   History of substance abuse (HCC)  01/17/2015   Morbid obesity (HCC) 05/11/2014   Nicotine  dependence, cigarettes, uncomplicated 10/25/2013   Generalized hyperhidrosis 09/14/2012   Leiomyoma of uterus 10/30/2010    Allergies:  Allergies  Allergen Reactions   Lisinopril Swelling    Angioedema. ng   Medications:  Current Outpatient Medications:    Accu-Chek Softclix Lancets lancets, Use as instructed, Disp: 100 each, Rfl: 12   albuterol  (VENTOLIN  HFA) 108 (90 Base) MCG/ACT inhaler, INHALE 1 TO 2 PUFFS INTO THE LUNGS EVERY 6 HOURS AS NEEDED FOR WHEEZING OR SHORTNESS OF BREATH, Disp: 54 g, Rfl: 0   amLODipine  (NORVASC ) 10 MG tablet, Take 1 tablet (10 mg total) by mouth daily., Disp: 90 tablet, Rfl: 3   Ascorbic Acid (VITAMIN C) 100 MG tablet, Take 100 mg by mouth as needed., Disp: , Rfl:    azelastine  (ASTELIN ) 0.1 % nasal spray, Place 1 spray into both nostrils 2 (two) times daily., Disp: 30 mL, Rfl: 5   blood glucose meter kit and supplies, Dispense based on patient and insurance preference. Use up to four times daily as directed. (FOR ICD-10 E10.9, E11.9)., Disp: 1 each, Rfl: 0   docusate sodium  (COLACE) 100 MG capsule, Take 1 capsule (100 mg total) by mouth daily., Disp: 90 capsule, Rfl: 1   empagliflozin  (JARDIANCE ) 10 MG TABS tablet, Take 1 tablet (10 mg total) by mouth daily before breakfast., Disp: 30 tablet, Rfl: 2   fluticasone  furoate-vilanterol (BREO ELLIPTA ) 100-25 MCG/ACT AEPB, Inhale 1 puff into the lungs daily., Disp: 1 each, Rfl: 11   glucose blood (ACCU-CHEK GUIDE) test strip, Use as instructed, Disp: 100 each, Rfl: 12   LORazepam  (ATIVAN ) 0.5 MG tablet, TAKE 1 TABLET(0.5 MG) BY MOUTH EVERY 8 HOURS AS NEEDED FOR ANXIETY OR SLEEP, Disp: 30 tablet, Rfl: 0   metFORMIN  (GLUCOPHAGE -XR) 500 MG 24 hr tablet, TAKE 1 TABLET(500 MG) BY MOUTH TWICE DAILY WITH A MEAL, Disp: 180 tablet, Rfl: 3   Multiple Vitamin (MULTIVITAMIN) tablet, Take 1 tablet by mouth daily., Disp: , Rfl:    ondansetron  (ZOFRAN ) 4 MG tablet, Take 1  tablet (4 mg total) by mouth every 8 (eight) hours as needed for nausea or vomiting. (Patient not taking: Reported on 08/25/2023), Disp: 20 tablet, Rfl: 0   PROAIR  RESPICLICK 108 (90 Base) MCG/ACT AEPB, INHALE 2 PUFFS INTO THE LUNGS FOUR TIMES DAILY AS NEEDED, Disp: 1 each, Rfl: 0   rosuvastatin  (CRESTOR ) 40 MG tablet, Take 1 tablet (40 mg total) by mouth daily., Disp: 90 tablet, Rfl: 3   spironolactone  (ALDACTONE ) 25 MG tablet, Take 0.5 tablets (12.5 mg total) by mouth daily., Disp: 45 tablet, Rfl: 0   traZODone  (DESYREL ) 50 MG tablet, Take 0.5-1 tablets (25-50 mg total) by mouth at bedtime as needed for sleep. (Patient not taking: Reported on 08/25/2023), Disp: 30 tablet, Rfl: 0   venlafaxine  XR (EFFEXOR -XR) 75 MG 24 hr capsule, TAKE 3 CAPSULES BY MOUTH DAILY WITH BREAKFAST, Disp: 270 capsule, Rfl: 3  Observations/Objective: Patient is well-developed, well-nourished in no acute distress.  Resting comfortably  at home.  Head is normocephalic, atraumatic.  No labored breathing.  Speech is clear and coherent with logical content.  Patient  is alert and oriented at baseline.    Assessment and Plan: 1. Gross hematuria (Primary)  Patient presenting with gross hematuria started within the last 48 hours.  Patient does report that she recently started a new medication.  States that she reported this to the triage nurse this morning and was told that she could be seen via telehealth.  I advised her to immediately report to the emergency department.  Concern for possible kidney disease versus pyelonephritis versus cystitis.  Patient verbalized understanding and is to immediately report to the nearest emergency department for evaluation.  Follow Up Instructions: I discussed the assessment and treatment plan with the patient. The patient was provided an opportunity to ask questions and all were answered. The patient agreed with the plan and demonstrated an understanding of the instructions.  A copy of  instructions were sent to the patient via MyChart unless otherwise noted below.    The patient was advised to call back or seek an in-person evaluation if the symptoms worsen or if the condition fails to improve as anticipated.    [name], PA-C

## 2023-11-09 LAB — URINE CULTURE: Culture: >=100000 — AB

## 2023-11-09 MED ORDER — EMPAGLIFLOZIN 10 MG PO TABS: 10.0000 mg | ORAL_TABLET | Freq: Every day | ORAL | 2 refills | Status: DC

## 2023-11-09 MED ORDER — SPIRONOLACTONE 25 MG PO TABS: 12.5000 mg | ORAL_TABLET | Freq: Every day | ORAL | 3 refills | Status: AC

## 2023-11-20 ENCOUNTER — Encounter: Payer: Self-pay | Admitting: Nurse Practitioner

## 2023-11-23 ENCOUNTER — Ambulatory Visit: Payer: Self-pay

## 2023-11-23 NOTE — Telephone Encounter (Signed)
 Scheduled to see Leron Glance this week

## 2023-11-23 NOTE — Telephone Encounter (Signed)
 Copied from CRM [id number]. Topic: Clinical - Red Word Triage >> Nov 23, 2023  3:31 PM [place] A wrote: [facility] that prompted transfer to Nurse Triage: Patient is having blood in urine and pain from UTI    Reason for Disposition  Age > 50 years  Answer Assessment - Initial Assessment Questions 1. SEVERITY: How bad is the pain?  (e.g., Scale 1-10; mild, moderate, or severe)   - MILD (1-3): complains slightly about urination hurting   - MODERATE (4-7): interferes with normal activities     - SEVERE (8-10): excruciating, unwilling or unable to urinate because of the pain      7/10 2. FREQUENCY: How many times have you had painful urination today?      2 times  3. PATTERN: Is pain present every time you urinate or just sometimes?      Every urination  4. ONSET: When did the painful urination start?      3-4 weeks 5. FEVER: Do you have a fever? If Yes, ask: What is your temperature, how was it measured, and when did it start?     No 6. PAST UTI: Have you had a urine infection before? If Yes, ask: When was the last time? and What happened that time?      Yes, recently treated for the same  7. CAUSE: What do you think is causing the painful urination?  (e.g., UTI, scratch, Herpes sore)     UTI 8. OTHER SYMPTOMS: Do you have any other symptoms? (e.g., blood in urine, flank pain, genital sores, urgency, vaginal discharge)     Blood in urine  Protocols used: Urination Pain - Female-A-AH   FYI Only or Action Required?: FYI only for provider.  Patient was last seen in primary care on 08/25/2023 by Gretel App, NP. Called Nurse Triage reporting Dysuria. Symptoms began several weeks ago. Interventions attempted: Other: treated for UTI, continued symptoms. Symptoms are: gradually worsening.  Triage Disposition: See Physician Within 24 Hours  Patient/caregiver understands and will follow disposition?: Yes

## 2023-11-26 ENCOUNTER — Ambulatory Visit (INDEPENDENT_AMBULATORY_CARE_PROVIDER_SITE_OTHER): Payer: Self-pay | Admitting: Nurse Practitioner

## 2023-11-26 ENCOUNTER — Telehealth: Payer: Self-pay

## 2023-11-26 VITALS — BP 130/70 | HR 94 | Temp 98.5°F | Ht 61.0 in | Wt 225.0 lb

## 2023-11-26 DIAGNOSIS — E119 Type 2 diabetes mellitus without complications: Secondary | ICD-10-CM

## 2023-11-26 DIAGNOSIS — F32A Depression, unspecified: Secondary | ICD-10-CM

## 2023-11-26 DIAGNOSIS — G479 Sleep disorder, unspecified: Secondary | ICD-10-CM

## 2023-11-26 DIAGNOSIS — F419 Anxiety disorder, unspecified: Secondary | ICD-10-CM

## 2023-11-26 DIAGNOSIS — R110 Nausea: Secondary | ICD-10-CM

## 2023-11-26 DIAGNOSIS — Z6841 Body Mass Index (BMI) 40.0 and over, adult: Secondary | ICD-10-CM

## 2023-11-26 DIAGNOSIS — Z7985 Long-term (current) use of injectable non-insulin antidiabetic drugs: Secondary | ICD-10-CM

## 2023-11-26 DIAGNOSIS — F17210 Nicotine dependence, cigarettes, uncomplicated: Secondary | ICD-10-CM

## 2023-11-26 DIAGNOSIS — F4320 Adjustment disorder, unspecified: Secondary | ICD-10-CM

## 2023-11-26 DIAGNOSIS — R399 Unspecified symptoms and signs involving the genitourinary system: Secondary | ICD-10-CM

## 2023-11-26 LAB — POC URINALSYSI DIPSTICK (AUTOMATED)
Bilirubin, UA: NEGATIVE
Glucose, UA: NEGATIVE
Ketones, UA: NEGATIVE
Nitrite, UA: NEGATIVE
Protein, UA: POSITIVE — AB
Spec Grav, UA: 1.02 (ref 1.010–1.025)
Urobilinogen, UA: 0.2 U/dL
pH, UA: 5.5 (ref 5.0–8.0)

## 2023-11-26 LAB — POCT GLYCOSYLATED HEMOGLOBIN (HGB A1C): Hemoglobin A1C: 6.2 % — AB (ref 4.0–5.6)

## 2023-11-26 MED ORDER — CIPROFLOXACIN HCL 500 MG PO TABS: 500.0000 mg | ORAL_TABLET | Freq: Two times a day (BID) | ORAL | 0 refills | Status: AC

## 2023-11-26 MED ORDER — LORAZEPAM 0.5 MG PO TABS: 0.5000 mg | ORAL_TABLET | Freq: Three times a day (TID) | ORAL | 2 refills | Status: DC | PRN

## 2023-11-26 MED ORDER — ONDANSETRON HCL 4 MG PO TABS
4.0000 mg | ORAL_TABLET | Freq: Three times a day (TID) | ORAL | 0 refills | Status: AC | PRN
Start: 2023-11-26 — End: ?

## 2023-11-26 MED ORDER — BUPROPION HCL ER (SR) 150 MG PO TB12: ORAL_TABLET | ORAL | 0 refills | Status: AC

## 2023-11-26 MED ORDER — AZELASTINE HCL 0.1 % NA SOLN
1.0000 | Freq: Two times a day (BID) | NASAL | 5 refills | Status: AC
Start: 2023-11-26 — End: ?

## 2023-11-26 MED ORDER — FLUCONAZOLE 150 MG PO TABS: ORAL_TABLET | ORAL | 0 refills | Status: AC

## 2023-11-26 NOTE — Telephone Encounter (Signed)
 Can we get patient assistance started for Mounjaro for the patient?

## 2023-11-26 NOTE — Progress Notes (Signed)
 Leron Glance, NP-C Phone: [phone]  Judy Garcia is a 53 y.o. female who presents today for UTI symptoms.   Discussed the use of AI scribe software for clinical note transcription with the patient, who gave verbal consent to proceed.  History of Present Illness   Judy Garcia is a 53 year old female who presents with persistent urinary symptoms following a recent UTI.  She experienced a urinary tract infection (UTI) treated with Keflex  on June 6th. Despite completing the antibiotic course last week, she continues to have hematuria, particularly noticeable in the mornings, describing the urine as having visible blood. She also experiences burning during urination, increased frequency, and urgency. No abdominal pain or fever. Her previous urine culture showed E. Coli growth.  She mentions passing large amounts of mucus from her anus, which resolved after completing the antibiotics.  She is currently taking Ativan  and wants to quit smoking, having previously quit for 15 years using Zyban  (Wellbutrin ). She is considering using Wellbutrin  again to aid in smoking cessation.  She has been on Jardiance  for diabetes control but suspects it may have contributed to her UTIs. She notes that the onset of UTI symptoms was delayed after starting Jardiance .  She is actively seeking full-time employment and currently does not have health insurance, which impacts her ability to afford certain medications.  She reports ongoing sleep disturbances, which she attributes to stress and environmental factors, such as a recent lack of air conditioning during the summer months. She is working on improving her sleep hygiene.  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      Social History   Tobacco Use  Smoking Status Every Day   Current packs/day: 0.50   Average packs/day: 0.5 packs/day for 25.0  years (12.5 ttl pk-yrs)   Types: Cigarettes  Smokeless Tobacco Never    Current Outpatient Medications on File Prior to Visit  Medication Sig Dispense Refill   Accu-Chek Softclix Lancets lancets Use as instructed 100 each 12   albuterol  (VENTOLIN  HFA) 108 (90 Base) MCG/ACT inhaler INHALE 1 TO 2 PUFFS INTO THE LUNGS EVERY 6 HOURS AS NEEDED FOR WHEEZING OR SHORTNESS OF BREATH 54 g 0   amLODipine  (NORVASC ) 10 MG tablet Take 1 tablet (10 mg total) by mouth daily. 90 tablet 3   Ascorbic Acid (VITAMIN C) 100 MG tablet Take 100 mg by mouth as needed.     blood glucose meter kit and supplies Dispense based on patient and insurance preference. Use up to four times daily as directed. (FOR ICD-10 E10.9, E11.9). 1 each 0   docusate sodium  (COLACE) 100 MG capsule Take 1 capsule (100 mg total) by mouth daily. 90 capsule 1   fluticasone  furoate-vilanterol (BREO ELLIPTA ) 100-25 MCG/ACT AEPB Inhale 1 puff into the lungs daily. 1 each 11   glucose blood (ACCU-CHEK GUIDE) test strip Use as instructed 100 each 12   metFORMIN  (GLUCOPHAGE -XR) 500 MG 24 hr tablet TAKE 1 TABLET(500 MG) BY MOUTH TWICE DAILY WITH A MEAL 180 tablet 3   Multiple Vitamin (MULTIVITAMIN) tablet Take 1 tablet by mouth daily.     PROAIR  RESPICLICK 108 (90 Base) MCG/ACT AEPB INHALE 2 PUFFS INTO THE LUNGS FOUR TIMES DAILY AS NEEDED 1 each 0   rosuvastatin  (CRESTOR ) 40 MG tablet Take 1 tablet (40 mg total) by mouth daily. 90 tablet 3   spironolactone  (ALDACTONE ) 25 MG tablet Take 0.5  tablets (12.5 mg total) by mouth daily. 45 tablet 3   venlafaxine  XR (EFFEXOR -XR) 75 MG 24 hr capsule TAKE 3 CAPSULES BY MOUTH DAILY WITH BREAKFAST 270 capsule 3   No current facility-administered medications on file prior to visit.     ROS see history of present illness  Objective  Physical Exam Vitals:   11/26/23 1624  BP: 130/70  Pulse: 94  Temp: 98.5 F (36.9 C)  SpO2: 94%    BP Readings from Last 3 Encounters:  11/26/23 130/70  11/06/23 (!)  145/85  07/28/23 128/66   Wt Readings from Last 3 Encounters:  11/26/23 225 lb (102.1 kg)  11/06/23 225 lb (102.1 kg)  08/25/23 230 lb (104.3 kg)    Physical Exam Constitutional:      General: She is not in acute distress.    Appearance: Normal appearance.  HENT:     Head: Normocephalic.  Cardiovascular:     Rate and Rhythm: Normal rate and regular rhythm.     Heart sounds: Normal heart sounds.  Pulmonary:     Effort: Pulmonary effort is normal.     Breath sounds: Normal breath sounds.  Skin:    General: Skin is warm and dry.  Neurological:     General: No focal deficit present.     Mental Status: She is alert.  Psychiatric:        Mood and Affect: Mood normal.        Behavior: Behavior normal.      Assessment/Plan: Please see individual problem list.  UTI symptoms Assessment & Plan: Persistent symptoms indicate incomplete eradication of E. Coli infection. Awaiting culture results for further evaluation. Prescribe Cipro  for 5 days and order urine culture and sensitivity. Prescribe Diflucan  for potential yeast infection. If culture is negative and hematuria persists, order abdominal and pelvic scan. Encourage adequate hydration.   Orders: -     POCT Urinalysis Dipstick (Automated) -     Urinalysis, Routine w reflex microscopic -     Urine Culture -     Ciprofloxacin  HCl; Take 1 tablet (500 mg total) by mouth 2 (two) times daily for 5 days.  Dispense: 10 tablet; Refill: 0 -     Fluconazole ; Take 1 tablet by mouth x 1 dose. May repeat in 72 hours if needed.  Dispense: 2 tablet; Refill: 0  Type 2 diabetes mellitus without complication, without long-term current use of insulin (HCC) Assessment & Plan: Stop Jardiance  due to side effects and start Ozempic  for weight loss and glycemic control. Continue metformin . Refer to pharmacy team for patient assistance with Ozempic . Counseled on the risk of pancreatitis and gallbladder disease. Discussed the risk of nausea. Advised to  discontinue the medication and contact us  immediately if they develop abdominal pain. If they develop excessive nausea they will contact us  right away. I discussed that medullary thyroid  cancer has been seen in rats studies. The patient confirmed no personal or family history of thyroid  cancer, parathyroid cancer, or adrenal gland cancer. Discussed that we thus far have not seen medullary thyroid  cancer result from use of this type of medication in humans. Advised to monitor the thyroid  area and contact us  for any lumps, swelling, trouble swallowing, or any other changes in this area. Discussed the importance of healthy diet, exercise and lifestyle modifications even with this medication.  Orders: -     POCT glycosylated hemoglobin (Hb A1C) -     Ozempic  (0.25 or 0.5 MG/DOSE); Inject 0.25 mg into the skin once a  week for 28 days, THEN 0.5 mg once a week.  Nicotine  dependence, cigarettes, uncomplicated Assessment & Plan: Plan to start Wellbutrin  for smoking cessation after completing antibiotics for UTI. Prescribe Wellbutrin  twice a day, ensuring the second dose is not taken after 6 PM. Advise to stop smoking within 5-7 days of starting Wellbutrin .  Orders: -     buPROPion  HCl ER (SR); Take 1 tablet by mouth daily x 3 days then increase to twice daily. Take second dose no later than 6 pm. Stop smoking within first week of use.  Dispense: 180 tablet; Refill: 0  Sleep disturbance Assessment & Plan: Difficulty sleeping is related to stressors and environmental factors. Currently using Ativan  and improving sleep hygiene. Refill Ativan  and encourage continued practice of good sleep hygiene. PDMP reviewed.   Orders: -     LORazepam ; Take 1 tablet (0.5 mg total) by mouth every 8 (eight) hours as needed for anxiety.  Dispense: 30 tablet; Refill: 2  Grief reaction Assessment & Plan: Increased anxiety, depression and sleep disturbances are likely related to grief. Effexor  effectively manages her mood, and  lorazepam  is effective for anxiety and sleep. Continue Effexor  as prescribed and lorazepam  as needed. Encourage continuation of weekly grief counseling. We will continue to monitor.   Orders: -     LORazepam ; Take 1 tablet (0.5 mg total) by mouth every 8 (eight) hours as needed for anxiety.  Dispense: 30 tablet; Refill: 2  Anxiety and depression -     LORazepam ; Take 1 tablet (0.5 mg total) by mouth every 8 (eight) hours as needed for anxiety.  Dispense: 30 tablet; Refill: 2  Nausea -     Ondansetron  HCl; Take 1 tablet (4 mg total) by mouth every 8 (eight) hours as needed for nausea or vomiting.  Dispense: 20 tablet; Refill: 0  Other orders -     Azelastine  HCl; Place 1 spray into both nostrils 2 (two) times daily.  Dispense: 30 mL; Refill: 5     Return in about 3 months (around 02/26/2024) for Follow up.   Leron Glance, NP-C [facility]

## 2023-11-27 LAB — URINALYSIS, ROUTINE W REFLEX MICROSCOPIC
Bilirubin Urine: NEGATIVE
Ketones, ur: NEGATIVE
Leukocytes,Ua: NEGATIVE
Nitrite: NEGATIVE
Specific Gravity, Urine: 1.025 (ref 1.000–1.030)
Urine Glucose: NEGATIVE
Urobilinogen, UA: 0.2 (ref 0.0–1.0)
pH: 6 (ref 5.0–8.0)

## 2023-11-27 LAB — URINE CULTURE
MICRO NUMBER:: 16629245
Result:: NO GROWTH
SPECIMEN QUALITY:: ADEQUATE

## 2023-12-01 ENCOUNTER — Ambulatory Visit: Payer: Self-pay | Admitting: Family

## 2023-12-10 ENCOUNTER — Encounter: Payer: Self-pay | Admitting: Nurse Practitioner

## 2023-12-10 ENCOUNTER — Other Ambulatory Visit (INDEPENDENT_AMBULATORY_CARE_PROVIDER_SITE_OTHER): Payer: Self-pay | Admitting: Pharmacist

## 2023-12-10 DIAGNOSIS — E119 Type 2 diabetes mellitus without complications: Secondary | ICD-10-CM

## 2023-12-10 DIAGNOSIS — Z7985 Long-term (current) use of injectable non-insulin antidiabetic drugs: Secondary | ICD-10-CM

## 2023-12-10 DIAGNOSIS — Z7984 Long term (current) use of oral hypoglycemic drugs: Secondary | ICD-10-CM

## 2023-12-10 MED ORDER — OZEMPIC (0.25 OR 0.5 MG/DOSE) 2 MG/3ML ~~LOC~~ SOPN: PEN_INJECTOR | SUBCUTANEOUS | Status: AC

## 2023-12-10 NOTE — Assessment & Plan Note (Addendum)
 Persistent symptoms indicate incomplete eradication of E. Coli infection. Awaiting culture results for further evaluation. Prescribe Cipro  for 5 days and order urine culture and sensitivity. Prescribe Diflucan  for potential yeast infection. If culture is negative and hematuria persists, order abdominal and pelvic scan. Encourage adequate hydration.

## 2023-12-10 NOTE — Assessment & Plan Note (Addendum)
 Difficulty sleeping is related to stressors and environmental factors. Currently using Ativan  and improving sleep hygiene. Refill Ativan  and encourage continued practice of good sleep hygiene. PDMP reviewed.

## 2023-12-10 NOTE — Progress Notes (Signed)
   12/10/2023 Name: Addeline Calarco MRN: [id number] DOB: 10-Nov-1970  Subjective  Chief Complaint  Patient presents with   Medication Access    Care Team: Primary Care Provider: Gretel App, NP  Reason for visit: ?  Hector Venne is a 53 y.o. female who has medication access concerns regarding their diabetes regimen. ?   Medication Access: ?  PCP would like to start Mounjaro though notes patient has no insurance. If unable to get Mounjaro, PCP confirms preference for Ozempic  through Patient assistance program if patient qualifies.   Prescription drug coverage: NO Income: Job loss   Assessment and Plan:   1. Medication Access Likely eligible for Novo Nordisk PAP given no insurance coverage at this time. Online Novo tool shows she is likely eligible.  Patient pages submitted online.  Provider pages filled out and uploaded to PCP eFax folder for review/mediation selection/signature   Future Appointments  Date Time Provider Department Center  02/26/2024  2:40 PM Gretel App, NP [facility]    Manuelita FABIENE Kobs, PharmD Clinical Pharmacist [facility] [phone]

## 2023-12-10 NOTE — Assessment & Plan Note (Signed)
 Plan to start Wellbutrin  for smoking cessation after completing antibiotics for UTI. Prescribe Wellbutrin  twice a day, ensuring the second dose is not taken after 6 PM. Advise to stop smoking within 5-7 days of starting Wellbutrin .

## 2023-12-10 NOTE — Assessment & Plan Note (Signed)
 Increased anxiety, depression and sleep disturbances are likely related to grief. Effexor  effectively manages her mood, and lorazepam  is effective for anxiety and sleep. Continue Effexor  as prescribed and lorazepam  as needed. Encourage continuation of weekly grief counseling. We will continue to monitor.

## 2023-12-10 NOTE — Assessment & Plan Note (Signed)
 Stop Jardiance  due to side effects and start Ozempic  for weight loss and glycemic control. Continue metformin . Refer to pharmacy team for patient assistance with Ozempic . Counseled on the risk of pancreatitis and gallbladder disease. Discussed the risk of nausea. Advised to discontinue the medication and contact us  immediately if they develop abdominal pain. If they develop excessive nausea they will contact us  right away. I discussed that medullary thyroid  cancer has been seen in rats studies. The patient confirmed no personal or family history of thyroid  cancer, parathyroid cancer, or adrenal gland cancer. Discussed that we thus far have not seen medullary thyroid  cancer result from use of this type of medication in humans. Advised to monitor the thyroid  area and contact us  for any lumps, swelling, trouble swallowing, or any other changes in this area. Discussed the importance of healthy diet, exercise and lifestyle modifications even with this medication.

## 2023-12-14 NOTE — Telephone Encounter (Signed)
 Form handed to provider on today

## 2024-01-27 ENCOUNTER — Ambulatory Visit: Payer: Self-pay

## 2024-01-27 ENCOUNTER — Ambulatory Visit: Payer: Self-pay | Admitting: Student

## 2024-01-27 NOTE — Telephone Encounter (Signed)
 FYI Only or Action Required?: FYI only for provider.  Patient was last seen in primary care on 11/26/2023 by Gretel App, NP.  Called Nurse Triage reporting urinary symptoms.  Symptoms began several days ago.  Interventions attempted: Rest, hydration, or home remedies.  Symptoms are: gradually worsening.  Triage Disposition: See Physician Within 24 Hours  Patient/caregiver understands and will follow disposition?: Yes   Reason for Disposition  Blood in urine (red, pink, or tea-colored)  Answer Assessment - Initial Assessment Questions Additional info: No acute visit at practice location until Sept 3rd, patient states other location are too far for her to travel, she plans on attending [facility] today.     1. SEVERITY: How bad is the pain?  (e.g., Scale 1-10; mild, moderate, or severe)     buring 2. FREQUENCY: How many times have you had painful urination today?     increased 3. PATTERN: Is pain present every time you urinate or just sometimes?      each 4. ONSET: When did the painful urination start?      2-3 days ago  5. FEVER: Do you have a fever? If Yes, ask: What is your temperature, how was it measured, and when did it start?     denies 6. PAST UTI: Have you had a urine infection before? If Yes, ask: When was the last time? and What happened that time?      Few months back 7. CAUSE: What do you think is causing the painful urination?  (e.g., UTI, scratch, Herpes sore)     uti 8. OTHER SYMPTOMS: Do you have any other symptoms? (e.g., blood in urine, flank pain, genital sores, urgency, vaginal discharge)     Pink urine  Protocols used: Urination Pain - Female-A-AH

## 2024-01-27 NOTE — Telephone Encounter (Signed)
 Copied from CRM [id number]. Topic: Clinical - Red Word Triage >> Jan 27, 2024 11:53 AM Arm[place] J wrote: [facility] that prompted transfer to Nurse Triage: Patient believes she has a UTI. She is having burning and pain when urinating and she is passing a small amount of blood in her urine.  The patient did not want to be immediately transferred since she just arrived to a job interview. She will be done with her interview in 30 minutes and would like to be called around then.

## 2024-02-26 ENCOUNTER — Ambulatory Visit: Payer: Self-pay | Admitting: Nurse Practitioner

## 2024-02-26 ENCOUNTER — Telehealth: Payer: Self-pay | Admitting: Nurse Practitioner

## 2024-02-26 NOTE — Telephone Encounter (Signed)
 Lm that appt has been canceled and rescheduled for 10/7 @ 11am. Mychart message sent.

## 2024-03-08 ENCOUNTER — Ambulatory Visit: Payer: Self-pay | Admitting: Nurse Practitioner

## 2024-03-09 ENCOUNTER — Telehealth: Payer: Self-pay | Admitting: Nurse Practitioner

## 2024-03-09 NOTE — Telephone Encounter (Signed)
 Unable to lm to reschedule appointment, voice box is full. Sent out text and MyChart message to patient.  E2C2 please reschedule appt

## 2024-03-10 ENCOUNTER — Ambulatory Visit: Payer: Self-pay | Admitting: Nurse Practitioner

## 2024-03-21 ENCOUNTER — Emergency Department
Admission: EM | Admit: 2024-03-21 | Discharge: 2024-03-21 | Disposition: A | Discharge status: Home / Self Care | Payer: Self-pay | Attending: Emergency Medicine | Admitting: Emergency Medicine

## 2024-03-21 ENCOUNTER — Telehealth: Payer: Self-pay

## 2024-03-21 ENCOUNTER — Ambulatory Visit: Payer: Self-pay

## 2024-03-21 ENCOUNTER — Other Ambulatory Visit: Payer: Self-pay

## 2024-03-21 DIAGNOSIS — E119 Type 2 diabetes mellitus without complications: Secondary | ICD-10-CM | POA: Insufficient documentation

## 2024-03-21 DIAGNOSIS — R040 Epistaxis: Secondary | ICD-10-CM | POA: Insufficient documentation

## 2024-03-21 DIAGNOSIS — I10 Essential (primary) hypertension: Secondary | ICD-10-CM | POA: Insufficient documentation

## 2024-03-21 MED ORDER — OXYMETAZOLINE HCL 0.05 % NA SOLN
1.0000 | Freq: Once | NASAL | Status: AC
  Administered 2024-03-21: 1 via NASAL
  Filled 2024-03-21: qty 30

## 2024-03-21 MED ORDER — CLONIDINE HCL 0.1 MG PO TABS
0.1000 mg | ORAL_TABLET | Freq: Once | ORAL | Status: AC
  Administered 2024-03-21: 0.1 mg via ORAL
  Filled 2024-03-21: qty 1

## 2024-03-21 NOTE — Telephone Encounter (Signed)
 Pt is scheduled to see Wendee, NP tomorrow.

## 2024-03-21 NOTE — ED Notes (Signed)
 No further signs of bleeding noted. Patient reports no pain or discomfort. Escorted to ED lobby.

## 2024-03-21 NOTE — Discharge Instructions (Signed)
 As we discussed please use Vaseline/petroleum jelly to the inside of both nostrils twice daily for the next 7 days.  If you have any further rebleeding please apply 2 sprays of Afrin to each nostril and then clamp the nose for 15 minutes.  If you continue having bleeding please return to the emergency department for evaluation.

## 2024-03-21 NOTE — Telephone Encounter (Signed)
 Noted. Will evaluate in office

## 2024-03-21 NOTE — ED Triage Notes (Addendum)
 Pt arrives POV, ambulatory to triage, gait steady, no acute distress noted c/o intermittent nosebleed since 2100. Denies injury or thinners. Homemade packing removed and nasal clamp placed. Pt reports nosebleed had stopped prior to going to sleep, woke up coughing around 0245 and it restarted and has not stopped since.

## 2024-03-21 NOTE — ED Provider Notes (Signed)
 [facility] Provider Note    Event Date/Time   First MD Initiated Contact with Patient 03/21/24 [phone]     (approximate)  History   Chief Complaint: Epistaxis  HPI  Judy Garcia is a 53 y.o. female with a past medical history of diabetes, gastric reflux, hypertension, presents to the emergency department for a nosebleed.  According to the patient she was driving when she coughed and then developed a nosebleed after coughing.  Patient states she has had 1 nosebleed previously.  Denies any blood thinners.  Does take medication for high blood pressure currently 169/86.  Patient states she was able to pack her nose with tissue and get the bleed to stop however when she got home it restarted and patient was unable to get it to stop at home so she came to the emergency department for evaluation.  Upon arrival patient had mild bleeding mostly out of the left nostril but she states some bleeding out of both nostrils.  Patient had Afrin sprayed in both nostrils and nasal clamp applied.  Physical Exam   Triage Vital Signs: ED Triage Vitals  Encounter Vitals Group     BP 03/21/24 0328 (!) 169/86     Girls Systolic BP Percentile --      Girls Diastolic BP Percentile --      Boys Systolic BP Percentile --      Boys Diastolic BP Percentile --      Pulse Rate 03/21/24 0328 75     Resp 03/21/24 0328 18     Temp 03/21/24 0328 (!) 97.5 F (36.4 C)     Temp Source 03/21/24 0328 Oral     SpO2 03/21/24 0328 100 %     Weight --      Height --      Head Circumference --      Peak Flow --      Pain Score 03/21/24 0353 0     Pain Loc --      Pain Education --      Exclude from Growth Chart --     Most recent vital signs: Vitals:   03/21/24 0328  BP: (!) 169/86  Pulse: 75  Resp: 18  Temp: (!) 97.5 F (36.4 C)  SpO2: 100%    General: Awake, no distress.  CV:  Good peripheral perfusion.  Regular rate and rhythm  Resp:  Normal effort.  Equal breath sounds  bilaterally.  Abd:  No distention.   Other:  Patient has blood clot in her left nostril no bleeding in the right nostril.  After blowing her nose to remove the clot I do not see any active bleeding.   ED Results / Procedures / Treatments   MEDICATIONS ORDERED IN ED: Medications  oxymetazoline (AFRIN) 0.05 % nasal spray 1 spray (1 spray Each Nare Given 03/21/24 0332)     IMPRESSION / MDM / ASSESSMENT AND PLAN / ED COURSE  I reviewed the triage vital signs and the nursing notes.  Patient's presentation is most consistent with acute illness / injury with system symptoms.  Patient presents emergency department for a nosebleed.  Overall patient appears well, no distress she has hypertensive 169/86 has a history of hypertension has not taken her morning medications yet.  Will dose 0.1 mg of clonidine to reduce the patient's blood pressure.  Afrin was applied to both nostrils in triage and a nasal clamp was applied.  I remove the nasal clamp patient had moderate amount of clotted  blood in the left nostril after blowing her nose we were able to remove the clot and there is no further bleeding at this time.  I sprayed an additional dose of Afrin in the left nostril we will continue to closely monitor over the next hour or so to ensure no rebleeding.  I do not see any obvious area on the septum of either nostril that would be a possible source of the bleed.  If the patient remains hemostatic anticipate discharge home with Afrin to be used for any further rebleeding I also discussed with the patient using Vaseline/petroleum jelly on the septum of each nostril for the next 7 days.  Patient states she recently just turned her heat on in her home which could be the culprit for the bleed.  Remains hemostatic over 2 hours at this time.  Patient states she is ready go home.  Blood pressure is normalized.  Will discharge with Afrin to be used if needed I also discussed petroleum jelly.  Patient will follow-up with  her doctor.  Discussed return precautions.  FINAL CLINICAL IMPRESSION(S) / ED DIAGNOSES   Epistaxis   Note:  This document was prepared using Dragon voice recognition software and may include unintentional dictation errors.   Dorothyann Drivers, MD 03/21/24 [phone]

## 2024-03-21 NOTE — ED Notes (Signed)
 EDP at bedside. Removed clamp from nose. No bleeding noted. Patient blew nose to remove clots with no bleeding noted. Safety precautions in place. Call light within reach.

## 2024-03-21 NOTE — Telephone Encounter (Signed)
 FYI Only or Action Required?: Action required by provider: request for appointment.  Patient was last seen in primary care on 11/26/2023 by Gretel App, NP.  Called Nurse Triage reporting Facial Pain.  Symptoms began yesterday.  Interventions attempted: Rest, hydration, or home remedies.  Symptoms are: stable.Pt. Feels like she has s a sinus infection and was seen in ED 03/21/24 WITH NOSE BLEEDS.  Asking to be seen tomorrow. No availability, spoke with Suzen in the practice, appointment made. Triage Disposition: See Physician Within 24 Hours  Patient/caregiver understands and will follow disposition?: Yes    Copied from CRM #[id number]. Topic: Clinical - Red Word Triage >> Mar 21, 2024  3:28 PM Alfonso ORN wrote: Red Word that prompted transfer to Nurse Triage:  sinus infection . symptoms since last sunday. nose started bleeding last night and off and on today been to ER regarding nose bleeds been triaged and only treating nose bleeds at ER . Pt feels she needs to be treated for an infection. Reason for Disposition  Earache  Answer Assessment - Initial Assessment Questions 1. LOCATION: Where does it hurt?      Face 2. ONSET: When did the sinus pain start?  (e.g., hours, days)      8 days 3. SEVERITY: How bad is the pain?   (Scale 0-10; or none, mild, moderate or severe)     moderate 4. RECURRENT SYMPTOM: Have you ever had sinus problems before? If Yes, ask: When was the last time? and What happened that time?      yes 5. NASAL CONGESTION: Is the nose blocked? If Yes, ask: Can you open it or must you breathe through your mouth?     yes 6. NASAL DISCHARGE: Do you have discharge from your nose? If so ask, What color?     clear 7. FEVER: Do you have a fever? If Yes, ask: What is it, how was it measured, and when did it start?      no 8. OTHER SYMPTOMS: Do you have any other symptoms? (e.g., sore throat, cough, earache, difficulty breathing)     Nose  bleeds 9. PREGNANCY: Is there any chance you are pregnant? When was your last menstrual period?     no  Protocols used: Sinus Pain or Congestion-A-AH

## 2024-03-21 NOTE — Telephone Encounter (Signed)
 FYI Only or Action Required?: FYI only for provider.  Patient was last seen in primary care on 11/26/2023 by Gretel App, NP.  Called Nurse Triage reporting Epistaxis.  Symptoms began last night and today.  Interventions attempted: Rest, hydration, or home remedies and Other: directions per ER.  Symptoms are: rapidly worsening.  Triage Disposition: Go to ED Now (Notify PCP)  Patient/caregiver understands and will follow disposition?: Yes---patient states she is going to the ER             Copied from CRM #[id number]. Topic: Clinical - Red Word Triage >> Mar 21, 2024  8:01 AM Harlene ORN wrote: Red Word that prompted transfer to Nurse Triage: nose bleed; went to the ED at 3 am got home an hour ago started back up again 10 minutes ago can't get it to stop Reason for Disposition  [1] Bleeding now AND [2] second call after being instructed in correct technique of direct pressure  Answer Assessment - Initial Assessment Questions Patient states that the ER advised her that if it started back bleeding to come back so they can pack it and refer her to ENT. Patient states it has been bleeding twice since she left the ER about an hour prior to triage. She states that it led for about 10 minutes, stopped for a bit, then started back for about 15 or so minutes now. She denies feeling dizzy or light headed but states it's continually bleeding. Patient is advised that going back to the ER is recommended and if she does develop symptoms like light headedness or dizziness to call 911. She states that she doesn't have anyone to drive her so she has to drive herself. She was advised that someone driving her was the safest recommendation but she states she doesn't have anyone else at the moment and she is going to drive herself. She is also advised to call us  back if she needs anything. She verbalized understanding.  Patient is advised that at any point if she feels dizzy, weak, or light  headed she can call 911.  1. AMOUNT OF BLEEDING: How bad is the bleeding? How much blood was lost? Has the bleeding stopped?     Patient states bad 2. ONSET: When did the nosebleed start?      This episode 15 or so minutes prior to triage but patient had another nosebleed just prior to that that lasted ten minutes 3. FREQUENCY: How many nosebleeds have you had in the last 24 hours?      2 since leaving ER  Protocols used: Nosebleed-A-AH

## 2024-03-21 NOTE — Telephone Encounter (Signed)
 Sonny, Triage Nurse, called to state patient has been having nose bleeds and has been to the ED and Sonny states she is documenting all the details from the call.  Leslie's recommendation is for patient to be seen either today or tomorrow.  I spoke with [facility] at [facility] and they verified that it was ok for me to schedule patient with Lynwood Crandall, NP, tomorrow at 10:20am or with Anton Blas, MD, tomorrow at [facility] Sonny believes patient may not still be on the line.  We scheduled appointment for patient to see Lynwood Crandall, NP, tomorrow at 10:20am.  Sonny will let patient know and have patient call [facility] at [facility] if she would prefer to have the 3pm appointment with Anton Blas, MD.

## 2024-03-22 ENCOUNTER — Ambulatory Visit: Payer: Self-pay

## 2024-03-22 ENCOUNTER — Telehealth: Payer: Self-pay

## 2024-03-22 ENCOUNTER — Ambulatory Visit: Payer: Self-pay | Admitting: Nurse Practitioner

## 2024-03-22 DIAGNOSIS — R040 Epistaxis: Secondary | ICD-10-CM

## 2024-03-22 NOTE — Telephone Encounter (Signed)
 Reviewed. Given persistent recurring nose bleeds, I would recommend seeing ENT for probable cauterization. If agreeable, I can place urgent referral. Please confirm if she is having active bleeding now. If increased bleeding now or if recurs, will need to be seen acutely for repacking, etc.

## 2024-03-22 NOTE — Telephone Encounter (Signed)
 Order placed for ENT referral.  Called [facility]. They can see her now.

## 2024-03-22 NOTE — Telephone Encounter (Signed)
 Erin, Triage Nurse, called to state she has recommended patient go to the ED.  Rocky states patient declined.  Rocky states patient had an appointment at [facility] today, but she overslept.  Rocky states patient does not want to be asked questions and only wants to speak with Leron Glance, FNP-C's nurse.  Rocky states she has documented the entire call in clinical note.  I reached out to Laurier Ellen, CMA, during the call, but she is on the phone (sent msg through Teams).  Rocky states patient has already hung up and is waiting for a call from us .

## 2024-03-22 NOTE — Telephone Encounter (Signed)
 Patient/caller refused triage.  Reason for refusal: I don't need triage, I only want to speak directly with Leron Odea nurse.                         Copied from CRM [id number]. Topic: Clinical - Red Word Triage >> Mar 22, 2024 11:09 AM Rosina BIRCH wrote: Red Word that prompted transfer to Nurse Triage: nose bleeds off and on and went to the emergency room and she was given an appointment at [facility] today but she overslept  Reason for Disposition  [1] Caller demands to speak with the PCP AND [2] about sick adult (or sick caller)  Answer Assessment - Initial Assessment Questions 1. SITUATION:  Document reason for call.     Patient calling in regarding ongoing nosebleeds  2. BACKGROUND: Document any background information (e.g., prior calls, known psychiatric history)     Patient was triaged x 2 yesterday  3. ASSESSMENT: Document your nursing assessment.     Patient would not allow this RN to ask assessment questions 4. RESPONSE: Document what your response or recommendation was.     Patient stated I don't need triage, I only want to speak directly with Leron Odea nurse. Patient was supposed to be evaluated in an alternate office this morning, but overslept. Patient does not want to speak with anyone other than her PCP's nurse. This RN advised that I would route message to clinic and she would receive a call back. Please advise.  Protocols used: Difficult Call-A-AH

## 2024-03-23 ENCOUNTER — Ambulatory Visit: Payer: Self-pay | Admitting: Family Medicine

## 2024-03-23 NOTE — Telephone Encounter (Signed)
 Please call and confirm pt was seen at ENT and doing ok. If was evaluated and doing ok, can confirm with [facility] if still needs to keep appt.

## 2024-04-19 ENCOUNTER — Ambulatory Visit: Payer: Self-pay

## 2024-04-19 NOTE — Telephone Encounter (Signed)
 FYI Only or Action Required?: FYI only for provider: appointment scheduled on 04/20/2024 at 3 PM.  Patient was last seen in primary care on 11/26/2023 by Gretel App, NP.  Called Nurse Triage reporting Otalgia and Dental Pain.  Symptoms began several days ago.  Interventions attempted: Rest, hydration, or home remedies.  Symptoms are: unchanged.  Triage Disposition: See Physician Within 24 Hours (overriding See Dentist Within 24 Hours), See Physician Within 24 Hours (overriding See Dentist Within 24 Hours)  Patient/caregiver understands and will follow disposition?: Yes  Patient called with concerns for tooth pain and ear pain. Endorses difficulty hearing and ear fullness. Scheduled to see another provider tomorrow at 3 PM.  Copied from CRM [id number]. Topic: Clinical - Red Word Triage >> Apr 19, 2024  2:25 PM Dedra B wrote: Red Word that prompted transfer to Nurse Triage: Pt has bad toothache and earache. She has difficulty hearing out of her ears and says they hurt and feel full. Warm transfer to NT. Reason for Disposition  Earache  (Exceptions: Brief ear pain of lasting less than 60 minutes, or earache occurring during air travel.)  [1] Face swelling AND [2] no fever  Answer Assessment - Initial Assessment Questions 1. LOCATION: Which tooth is hurting?  (e.g., right-side/left-side, upper/lower, front/back)     On the right side 2. ONSET: When did the toothache start?  (e.g., hours, days)      Over the last week 3. SEVERITY: How bad is the toothache?  (Scale 1-10; mild, moderate or severe)     8 out of 10 4. SWELLING: Is there any visible swelling of your face?     Slight per patient report 5. OTHER SYMPTOMS: Do you have any other symptoms? (e.g., fever)     Left sided ear pain  Answer Assessment - Initial Assessment Questions 1. LOCATION: Which ear is involved?     Bilateral ear pain 2. ONSET: When did the ear pain start?      Started a couple of days ago 3.  SEVERITY: How bad is the pain?  (Scale 1-10; mild, moderate or severe)     Mild pain 4. URI SYMPTOMS: Do you have a runny nose or cough?     cough 5. FEVER: Do you have a fever? If Yes, ask: What is your temperature, how was it measured, and when did it start?     no 6. CAUSE: Have you been swimming recently?, How often do you use Q-TIPS?, Have you had any recent air travel or scuba diving?     No swimming, use Q-TIPS randomly 7. OTHER SYMPTOMS: Do you have any other symptoms? (e.g., decreased hearing, dizziness, headache, stiff neck, vomiting)     Decreased hearing, ear fullness, stiff neck  Protocols used: Toothache-A-AH, Earache-A-AH

## 2024-04-20 ENCOUNTER — Ambulatory Visit: Payer: Self-pay | Admitting: Internal Medicine

## 2024-05-19 ENCOUNTER — Ambulatory Visit: Payer: Self-pay | Admitting: Nurse Practitioner

## 2024-05-29 ENCOUNTER — Other Ambulatory Visit: Payer: Self-pay | Admitting: Nurse Practitioner

## 2024-05-29 DIAGNOSIS — F419 Anxiety disorder, unspecified: Secondary | ICD-10-CM

## 2024-05-29 DIAGNOSIS — F4320 Adjustment disorder, unspecified: Secondary | ICD-10-CM

## 2024-05-29 DIAGNOSIS — G479 Sleep disorder, unspecified: Secondary | ICD-10-CM

## 2024-06-21 ENCOUNTER — Ambulatory Visit: Payer: Self-pay | Admitting: Nurse Practitioner

## 2024-06-22 ENCOUNTER — Ambulatory Visit: Admitting: Dermatology

## 2024-06-22 ENCOUNTER — Other Ambulatory Visit: Payer: Self-pay

## 2024-06-22 DIAGNOSIS — E7800 Pure hypercholesterolemia, unspecified: Secondary | ICD-10-CM

## 2024-06-22 MED ORDER — ROSUVASTATIN CALCIUM 40 MG PO TABS: 40.0000 mg | ORAL_TABLET | Freq: Every day | ORAL | 3 refills | Status: AC

## 2024-07-07 ENCOUNTER — Telehealth: Payer: Self-pay | Admitting: Nurse Practitioner

## 2024-07-07 NOTE — Telephone Encounter (Signed)
 Copied from CRM [id number]. Topic: Referral - Question >> Jul 06, 2024  4:38 PM Nessti S wrote: Reason for CRM: pt called because she would like referral to see a pulmonologist. She was referred before with previous pcp and would like to see a pulmonologist again.    ----------------------------------------------------------------------- From previous Reason for Contact - Pulmonology Self-Referral: This patient has scheduled a self-referred appointment. See attached appointment information and form for details.

## 2024-07-07 NOTE — Telephone Encounter (Signed)
 Pt scheduled to see kaur on tomorrow

## 2024-07-08 ENCOUNTER — Ambulatory Visit: Admitting: Nurse Practitioner
